# Patient Record
Sex: Female | Born: 1960
Health system: Southern US, Community
[De-identification: ages and names within clinical notes are randomized; demographics above are authoritative.]

## PROBLEM LIST (undated history)

## (undated) DIAGNOSIS — M1990 Unspecified osteoarthritis, unspecified site: Secondary | ICD-10-CM

## (undated) DIAGNOSIS — R011 Cardiac murmur, unspecified: Secondary | ICD-10-CM

## (undated) DIAGNOSIS — I890 Lymphedema, not elsewhere classified: Secondary | ICD-10-CM

## (undated) DIAGNOSIS — F419 Anxiety disorder, unspecified: Secondary | ICD-10-CM

## (undated) DIAGNOSIS — I7300 Raynaud's syndrome without gangrene: Secondary | ICD-10-CM

## (undated) DIAGNOSIS — F329 Major depressive disorder, single episode, unspecified: Secondary | ICD-10-CM

## (undated) DIAGNOSIS — Z87442 Personal history of urinary calculi: Secondary | ICD-10-CM

## (undated) DIAGNOSIS — F32A Depression, unspecified: Secondary | ICD-10-CM

## (undated) DIAGNOSIS — K219 Gastro-esophageal reflux disease without esophagitis: Secondary | ICD-10-CM

## (undated) DIAGNOSIS — J189 Pneumonia, unspecified organism: Secondary | ICD-10-CM

## (undated) DIAGNOSIS — M792 Neuralgia and neuritis, unspecified: Secondary | ICD-10-CM

## (undated) DIAGNOSIS — T8859XA Other complications of anesthesia, initial encounter: Secondary | ICD-10-CM

## (undated) HISTORY — PX: OTHER SURGICAL HISTORY: SHX169

## (undated) HISTORY — DX: Unspecified osteoarthritis, unspecified site: M19.90

## (undated) HISTORY — DX: Neuralgia and neuritis, unspecified: M79.2

## (undated) HISTORY — DX: Raynaud's syndrome without gangrene: I73.00

## (undated) HISTORY — PX: JOINT REPLACEMENT: SHX530

## (undated) HISTORY — DX: Anxiety disorder, unspecified: F41.9

## (undated) HISTORY — PX: REFRACTIVE SURGERY: SHX103

## (undated) HISTORY — PX: TOTAL SHOULDER REPLACEMENT: SUR1217

## (undated) HISTORY — DX: Depression, unspecified: F32.A

## (undated) HISTORY — DX: Gastro-esophageal reflux disease without esophagitis: K21.9

## (undated) HISTORY — DX: Major depressive disorder, single episode, unspecified: F32.9

## (undated) HISTORY — DX: Lymphedema, not elsewhere classified: I89.0

## (undated) HISTORY — PX: BREAST BIOPSY: SHX20

---

## 2001-10-03 HISTORY — PX: ABDOMINAL HYSTERECTOMY: SHX81

## 2002-10-03 HISTORY — PX: OTHER SURGICAL HISTORY: SHX169

## 2007-10-04 HISTORY — PX: REPLACEMENT TOTAL KNEE: SUR1224

## 2008-06-24 ENCOUNTER — Other Ambulatory Visit: Payer: Self-pay

## 2008-06-24 ENCOUNTER — Ambulatory Visit: Payer: Self-pay | Admitting: Unknown Physician Specialty

## 2008-06-24 ENCOUNTER — Ambulatory Visit: Payer: Self-pay | Admitting: Cardiovascular Disease

## 2008-06-30 ENCOUNTER — Inpatient Hospital Stay: Payer: Self-pay | Admitting: Unknown Physician Specialty

## 2008-08-11 ENCOUNTER — Ambulatory Visit: Payer: Self-pay | Admitting: Unknown Physician Specialty

## 2008-10-03 HISTORY — PX: ANKLE FRACTURE SURGERY: SHX122

## 2008-10-03 HISTORY — PX: COLONOSCOPY: SHX174

## 2009-10-02 ENCOUNTER — Inpatient Hospital Stay: Payer: Self-pay | Admitting: Orthopedic Surgery

## 2010-01-29 ENCOUNTER — Ambulatory Visit: Payer: Self-pay | Admitting: Orthopedic Surgery

## 2010-09-23 ENCOUNTER — Ambulatory Visit: Payer: Self-pay | Admitting: Unknown Physician Specialty

## 2010-09-23 LAB — HM COLONOSCOPY

## 2010-09-28 LAB — PATHOLOGY REPORT

## 2010-10-03 LAB — HM PAP SMEAR

## 2011-04-25 IMAGING — CR DG KNEE 1-2V*L*
1 series · 2 of 2 positions shown · non-contrast
Comparison: none

REASON FOR EXAM: postop
COMMENTS:   Bedside (portable):Y

[Series 1: view not recorded · 0.17mm/px · 2 of 2 slices shown]
[im 1/2]
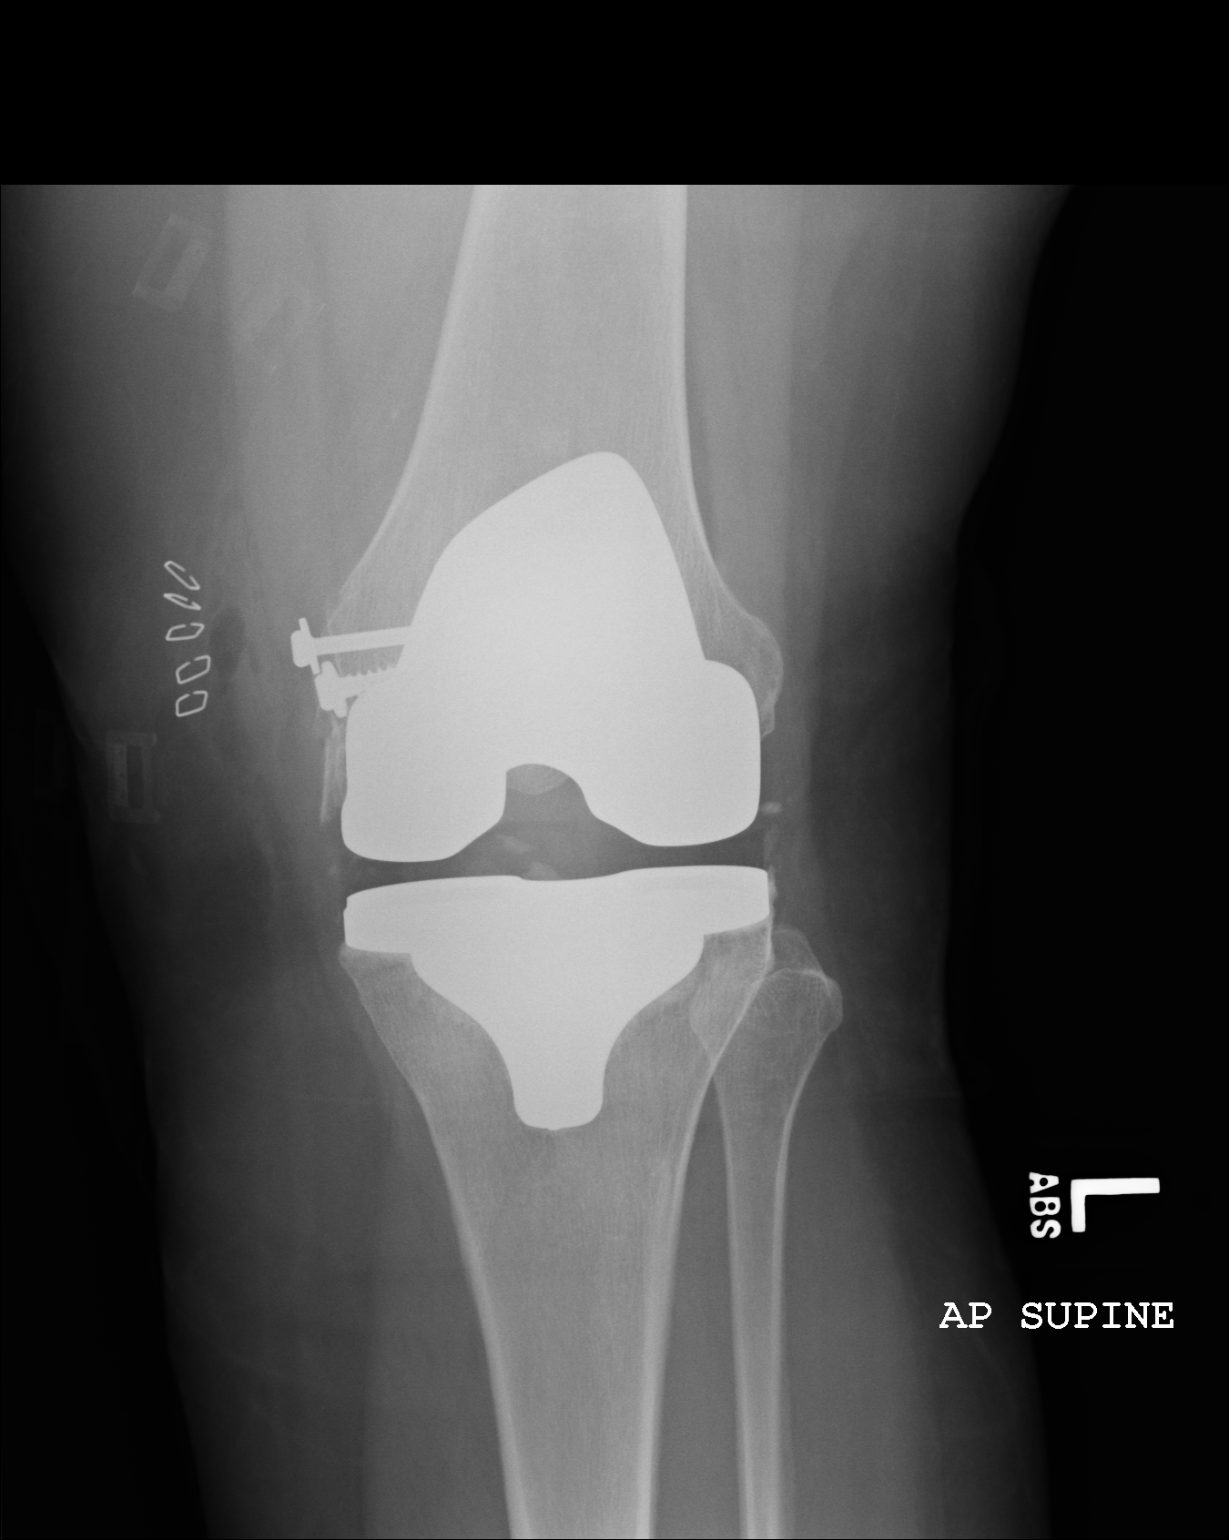
[im 2/2]
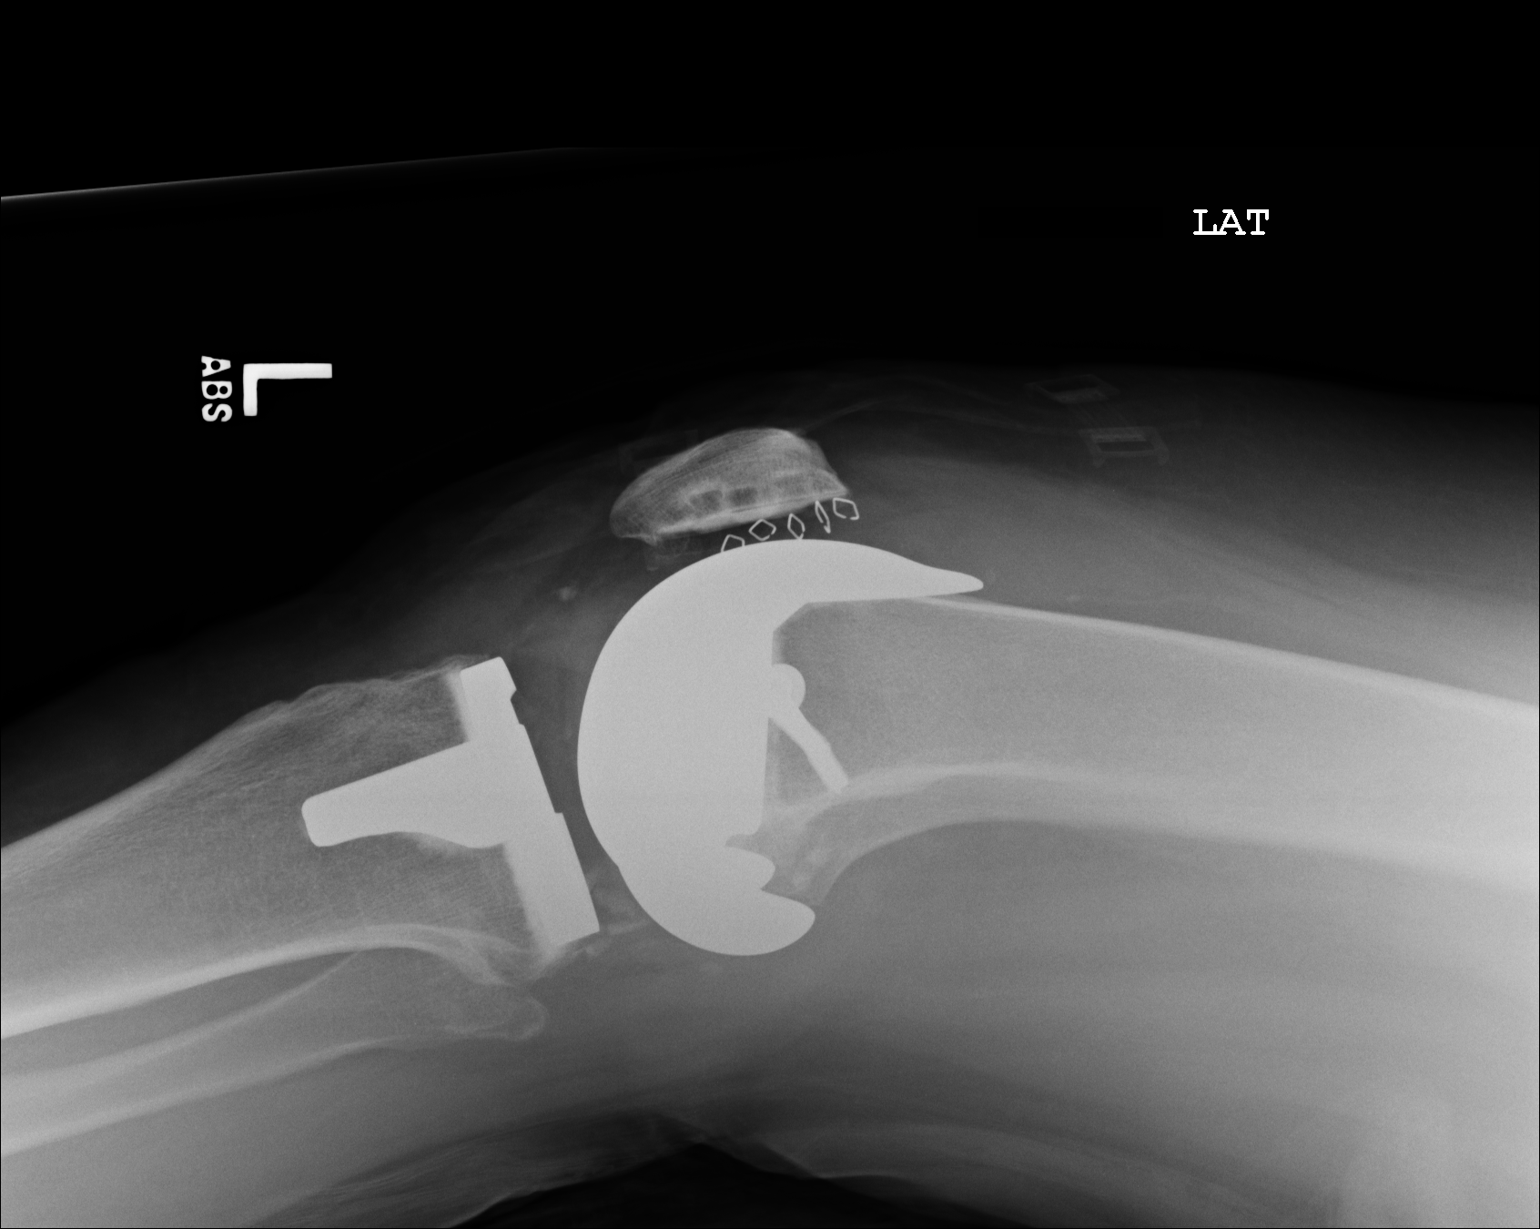

[2 of 2 positions shown; findings below may reference images not displayed]

PROCEDURE:     DXR - DXR KNEE LEFT AP AND LATERAL  - October 03, 2009 [DATE]

RESULT:

The patient is status post cannulated screw fixation of a medial femoral
condylar fracture. The hardware appears intact. Skin staples are identified
about the fracture site. The remaining osseous structures are unremarkable.
IMPRESSION: Open reduction and internal fixation of a medial femoral
condylar fracture.

## 2013-04-15 ENCOUNTER — Ambulatory Visit: Payer: Self-pay | Admitting: Family Medicine

## 2013-04-17 ENCOUNTER — Emergency Department: Payer: Self-pay | Admitting: Internal Medicine

## 2013-04-17 LAB — URINALYSIS, COMPLETE
Glucose,UR: NEGATIVE mg/dL (ref 0–75)
Ketone: NEGATIVE
Nitrite: NEGATIVE
Ph: 6 (ref 4.5–8.0)
RBC,UR: 7 /HPF (ref 0–5)
Squamous Epithelial: 2
WBC UR: 20 /HPF (ref 0–5)

## 2013-04-17 LAB — COMPREHENSIVE METABOLIC PANEL
Alkaline Phosphatase: 148 U/L — ABNORMAL HIGH (ref 50–136)
Bilirubin,Total: 0.6 mg/dL (ref 0.2–1.0)
Calcium, Total: 8.8 mg/dL (ref 8.5–10.1)
Chloride: 105 mmol/L (ref 98–107)
Co2: 28 mmol/L (ref 21–32)
EGFR (Non-African Amer.): 32 — ABNORMAL LOW
Glucose: 141 mg/dL — ABNORMAL HIGH (ref 65–99)
Potassium: 3.4 mmol/L — ABNORMAL LOW (ref 3.5–5.1)
SGOT(AST): 27 U/L (ref 15–37)
SGPT (ALT): 30 U/L (ref 12–78)
Total Protein: 7 g/dL (ref 6.4–8.2)

## 2013-04-17 LAB — CBC
HCT: 37.9 % (ref 35.0–47.0)
MCH: 26.4 pg (ref 26.0–34.0)
MCV: 81 fL (ref 80–100)
Platelet: 208 10*3/uL (ref 150–440)

## 2013-04-18 LAB — URINE CULTURE

## 2013-04-22 LAB — CULTURE, BLOOD (SINGLE)

## 2013-10-28 ENCOUNTER — Ambulatory Visit: Payer: Self-pay | Admitting: Orthopedic Surgery

## 2013-11-05 LAB — HM DEXA SCAN: HM Dexa Scan: NORMAL

## 2014-07-17 ENCOUNTER — Ambulatory Visit: Payer: Self-pay | Admitting: Neurology

## 2014-08-04 ENCOUNTER — Ambulatory Visit: Payer: Self-pay | Admitting: General Surgery

## 2014-08-04 ENCOUNTER — Encounter: Payer: Self-pay | Admitting: General Surgery

## 2014-08-04 ENCOUNTER — Ambulatory Visit (INDEPENDENT_AMBULATORY_CARE_PROVIDER_SITE_OTHER): Payer: 59 | Admitting: General Surgery

## 2014-08-04 VITALS — BP 120/84 | HR 76 | Temp 99.0°F | Resp 14 | Ht 73.0 in | Wt 261.0 lb

## 2014-08-04 DIAGNOSIS — R1030 Lower abdominal pain, unspecified: Secondary | ICD-10-CM

## 2014-08-04 DIAGNOSIS — R197 Diarrhea, unspecified: Secondary | ICD-10-CM

## 2014-08-04 LAB — CBC AND DIFFERENTIAL
HCT: 43 % (ref 36–46)
Hemoglobin: 14.3 g/dL (ref 12.0–16.0)
PLATELETS: 269 10*3/uL (ref 150–399)
WBC: 6.1 10^3/mL

## 2014-08-04 LAB — BASIC METABOLIC PANEL
BUN: 16 mg/dL (ref 4–21)
Creatinine: 1 mg/dL (ref <=1.1)
GLUCOSE: 98 mg/dL
POTASSIUM: 4.3 mmol/L (ref 3.4–5.3)
Sodium: 141 mmol/L (ref 137–147)

## 2014-08-04 LAB — HEPATIC FUNCTION PANEL
ALT: 14 U/L (ref 7–35)
AST: 19 U/L (ref 13–35)

## 2014-08-04 NOTE — Patient Instructions (Addendum)
Patient to have Ct Scan done. The patient is aware to call back for any questions or concerns.

## 2014-08-04 NOTE — Progress Notes (Signed)
Patient ID: Lisa Stein, female   DOB: 1961-06-23, 53 y.o.   MRN: [id number]  Chief Complaint  Patient presents with  . Other    evaluation for appendicitis    HPI Lisa Stein is a 53 y.o. female who presents for an evaluation of appendicitis. The patient states she started having abdominal cramping and diarrhea that started on Friday 08/01/14. The abdominal pain has progressively gotten worse. It started on the right side and has radiated to the left. She describes the pain as a soreness all over. No episodes of vomiting. She states the Thursday before, 07/24/14 she had an episode of abdominal cramping and vomiting that lasted several hours and then subsided. She was able to return to work the next and as well as go out of town that weekend. Current temperature is 99.0. The patient states yesterday she would episodes of diarrhea every time she tried to eat something. She had an episode of a black tarry stool yesterday.  Accompanied by her husband.   HPI  Past Medical History  Diagnosis Date  . Osteoarthritis   . Neuralgia   . GERD (gastroesophageal reflux disease)   . Raynaud disease     Past Surgical History  Procedure Laterality Date  . Replacement total knee  2009  . Total shoulder replacement  [id number]  . Abdominal hysterectomy  2003  . Ankle fracture surgery  2010  . Colonoscopy  2010    Dr. Vira Agar    History reviewed. No pertinent family history.  Social History History  Substance Use Topics  . Smoking status: Former Smoker -- 5 years  . Smokeless tobacco: Never Used  . Alcohol Use: No    Allergies  Allergen Reactions  . Biaxin [Clarithromycin] Diarrhea  . Sulfa Antibiotics Itching    Current Outpatient Prescriptions  Medication Sig Dispense Refill  . celecoxib (CELEBREX) 200 MG capsule Take 200 mg by mouth daily.    . Cholecalciferol (VITAMIN D-3 PO) Take 1 capsule by mouth daily.    . diazepam (VALIUM) 5 MG tablet Take 5 mg by mouth as needed for  anxiety.    . diphenhydrAMINE (BENADRYL) 50 MG tablet Take 50 mg by mouth at bedtime as needed for itching.    . fluticasone (VERAMYST) 27.5 MCG/SPRAY nasal spray Place 2 sprays into the nose daily.    Marland Kitchen HYDROcodone-acetaminophen (NORCO/VICODIN) 5-325 MG per tablet Take 1 tablet by mouth every 6 (six) hours as needed.   0  . lamoTRIgine (LAMICTAL) 25 MG tablet Take 25 mg by mouth 2 (two) times daily.   2  . omeprazole (PRILOSEC) 20 MG capsule Take 20 mg by mouth daily.    . sertraline (ZOLOFT) 100 MG tablet Take 200 mg by mouth daily.    . TEGRETOL-XR 100 MG 12 hr tablet Take 100 mg by mouth 2 (two) times daily.   5  . polyethylene glycol powder (GLYCOLAX/MIRALAX) powder Take 255 g by mouth once. 255 g 0   No current facility-administered medications for this visit.    Review of Systems Review of Systems  Constitutional: Positive for chills.  Respiratory: Negative.   Cardiovascular: Negative.   Gastrointestinal: Positive for abdominal pain and diarrhea.    Blood pressure 120/84, pulse 76, temperature 99 F (37.2 C), temperature source Oral, resp. rate 14, height 6\' 1"  (1.854 m), weight 261 lb (118.389 kg).  Physical Exam Physical Exam  Constitutional: She is oriented to person, place, and time. She appears well-developed and well-nourished.  Cardiovascular: Normal rate,  regular rhythm and normal heart sounds.   No murmur heard. Pulmonary/Chest: Effort normal and breath sounds normal.  Abdominal: Soft. Normal appearance and bowel sounds are normal. There is no hepatosplenomegaly. There is tenderness in the right lower quadrant. No hernia.    Neurological: She is alert and oriented to person, place, and time.  Skin: Skin is warm and dry.    Data Reviewed Discussed w/ PCP. Labs drawn but not available.   Assessment    Atypical abdominal pain, strong component of recurrent diarrhea. Low suspicion for appendicitis.     Plan    Patient has been scheduled for a CT  abdomen/pelvis with contrast at [facility] for this afternoon. This patient verbalizes understanding. Will see patient in CT department after scan completed.      PCP/Ref MD:  Philemon Kingdom 08/05/2014, 9:46 PM

## 2014-08-05 ENCOUNTER — Encounter: Payer: Self-pay | Admitting: General Surgery

## 2014-08-05 ENCOUNTER — Ambulatory Visit (INDEPENDENT_AMBULATORY_CARE_PROVIDER_SITE_OTHER): Payer: 59 | Admitting: General Surgery

## 2014-08-05 ENCOUNTER — Other Ambulatory Visit: Payer: Self-pay | Admitting: General Surgery

## 2014-08-05 VITALS — BP 146/86 | HR 78 | Temp 98.6°F | Resp 14 | Ht 73.0 in | Wt 260.0 lb

## 2014-08-05 DIAGNOSIS — R935 Abnormal findings on diagnostic imaging of other abdominal regions, including retroperitoneum: Secondary | ICD-10-CM

## 2014-08-05 DIAGNOSIS — R1030 Lower abdominal pain, unspecified: Secondary | ICD-10-CM

## 2014-08-05 DIAGNOSIS — R197 Diarrhea, unspecified: Secondary | ICD-10-CM

## 2014-08-05 HISTORY — DX: Diarrhea, unspecified: R19.7

## 2014-08-05 HISTORY — DX: Lower abdominal pain, unspecified: R10.30

## 2014-08-05 MED ORDER — POLYETHYLENE GLYCOL 3350 17 GM/SCOOP PO POWD: 1.0000 | Freq: Once | ORAL | Status: DC

## 2014-08-05 NOTE — Patient Instructions (Addendum)
Patient to be scheduled for colonoscopy. The patient is aware to call back for any questions or concerns.  Colonoscopy A colonoscopy is an exam to look at the entire large intestine (colon). This exam can help find problems such as tumors, polyps, inflammation, and areas of bleeding. The exam takes about 1 hour.  LET [facility] CARE PROVIDER KNOW ABOUT:   Any allergies you have.  All medicines you are taking, including vitamins, herbs, eye drops, creams, and over-the-counter medicines.  Previous problems you or members of your family have had with the use of anesthetics.  Any blood disorders you have.  Previous surgeries you have had.  Medical conditions you have. RISKS AND COMPLICATIONS  Generally, this is a safe procedure. However, as with any procedure, complications can occur. Possible complications include:  Bleeding.  Tearing or rupture of the colon wall.  Reaction to medicines given during the exam.  Infection (rare). BEFORE THE PROCEDURE   Ask your health care provider about changing or stopping your regular medicines.  You may be prescribed an oral bowel prep. This involves drinking a large amount of medicated liquid, starting the day before your procedure. The liquid will cause you to have multiple loose stools until your stool is almost clear or light green. This cleans out your colon in preparation for the procedure.  Do not eat or drink anything else once you have started the bowel prep, unless your health care provider tells you it is safe to do so.  Arrange for someone to drive you home after the procedure. PROCEDURE   You will be given medicine to help you relax (sedative).  You will lie on your side with your knees bent.  A long, flexible tube with a light and camera on the end (colonoscope) will be inserted through the rectum and into the colon. The camera sends video back to a computer screen as it moves through the colon. The colonoscope also releases  carbon dioxide gas to inflate the colon. This helps your health care provider see the area better.  During the exam, your health care provider may take a small tissue sample (biopsy) to be examined under a microscope if any abnormalities are found.  The exam is finished when the entire colon has been viewed. AFTER THE PROCEDURE   Do not drive for 24 hours after the exam.  You may have a small amount of blood in your stool.  You may pass moderate amounts of gas and have mild abdominal cramping or bloating. This is caused by the gas used to inflate your colon during the exam.  Ask when your test results will be ready and how you will get your results. Make sure you get your test results. Document Released: 09/16/2000 Document Revised: 07/10/2013 Document Reviewed: 05/27/2013 [facility] Patient Information 2015 [place], [place]. This information is not intended to replace advice given to you by your health care provider. Make sure you discuss any questions you have with your health care provider.  The patient is scheduled for a colonoscopy at [facility] on 08/07/14. She will pre register with the hospital today. She will only take her Lamictal and Tegretol at 6 am with a small sip of water the morning of. Miralax prescription has been sent into her pharmacy. She will continue her clear liquid diet after her colonoscopy. She is scheduled for surgery at [facility] on 08/08/14. She will pre admit by phone on 08/08/14. Patient is aware of dates and all instructions.

## 2014-08-05 NOTE — Progress Notes (Signed)
Patient ID: Lisa Stein, female   DOB: 01-19-1961, 53 y.o.   MRN: [id number]  Chief Complaint  Patient presents with  . Follow-up    CT scan    HPI Lisa Stein is a 53 y.o. female.  Here today for follow up CT scan and labs drawn yesterday. She states she had night sweats last night and feels nauseated, no vomiting. Has tolerated a soft diet without nausea. Loose stools persist.  HPI  Past Medical History  Diagnosis Date  . Osteoarthritis   . Neuralgia   . GERD (gastroesophageal reflux disease)   . Raynaud disease     Past Surgical History  Procedure Laterality Date  . Replacement total knee  2009  . Total shoulder replacement  [id number]  . Abdominal hysterectomy  2003  . Ankle fracture surgery  2010  . Colonoscopy  2010    Dr. Vira Agar    No family history on file.  Social History History  Substance Use Topics  . Smoking status: Former Smoker -- 5 years  . Smokeless tobacco: Never Used  . Alcohol Use: No    Allergies  Allergen Reactions  . Biaxin [Clarithromycin] Diarrhea  . Sulfa Antibiotics Itching    Current Outpatient Prescriptions  Medication Sig Dispense Refill  . celecoxib (CELEBREX) 200 MG capsule Take 200 mg by mouth daily.    . Cholecalciferol (VITAMIN D-3 PO) Take 1 capsule by mouth daily.    . diazepam (VALIUM) 5 MG tablet Take 5 mg by mouth as needed for anxiety.    . diphenhydrAMINE (BENADRYL) 50 MG tablet Take 50 mg by mouth at bedtime as needed for itching.    . fluticasone (VERAMYST) 27.5 MCG/SPRAY nasal spray Place 2 sprays into the nose daily.    Marland Kitchen HYDROcodone-acetaminophen (NORCO/VICODIN) 5-325 MG per tablet Take 1 tablet by mouth every 6 (six) hours as needed.   0  . lamoTRIgine (LAMICTAL) 25 MG tablet Take 25 mg by mouth 2 (two) times daily.   2  . omeprazole (PRILOSEC) 20 MG capsule Take 20 mg by mouth daily.    . sertraline (ZOLOFT) 100 MG tablet Take 200 mg by mouth daily.    . TEGRETOL-XR 100 MG 12 hr tablet Take 100 mg by  mouth 2 (two) times daily.   5  . polyethylene glycol powder (GLYCOLAX/MIRALAX) powder Take 255 g by mouth once. 255 g 0   No current facility-administered medications for this visit.    Review of Systems Review of Systems  Respiratory: Negative.   Cardiovascular: Negative.   Gastrointestinal: Positive for nausea. Negative for vomiting.  Neurological: Positive for weakness.    Blood pressure 146/86, pulse 78, temperature 98.6 F (37 C), temperature source Oral, resp. rate 14, height 6' 1"  (1.854 m), weight 260 lb (117.935 kg).  Physical Exam Physical Exam  Constitutional: She is oriented to person, place, and time. She appears well-developed and well-nourished.  Cardiovascular: Normal rate, regular rhythm and normal heart sounds.   No murmur heard. Pulmonary/Chest: Effort normal and breath sounds normal.  Abdominal: Soft. Normal appearance and bowel sounds are normal. There is no tenderness.  Neurological: She is alert and oriented to person, place, and time.  Skin: Skin is warm and dry.    Data Reviewed CT of yesterday reported to show a 6 cm mass in the cecum.  On my review, possible thickening of terminal ileum with short segment dilatation. Labs completed yesterday, CBC and Met C normal.  Assessment    Abdominal pain, abnormal  imaging.      Plan    Repeat colonoscopy is indicated to confirm CT findings of a cecal mass. Last exam was five years ago.  Would be unlikely to develop a large mass in this short of an interval.  Prior procedure completed by Dr. Tiffany Kocher.. Offered to contact him to coordinate study, patient comfortable with my completing procedure. This would be followed by surgery the following day, right colectomy if cecal tumor, cecectomy and resection of the TI if evidence of Crohns.      The patient is scheduled for a colonoscopy at [facility] on 08/07/14. She will pre register with the hospital today. She will only take her Lamictal and Tegretol at 6 am with a small  sip of water the morning of. Miralax prescription has been sent into her pharmacy. She will continue her clear liquid diet after her colonoscopy. She is scheduled for surgery at [facility] on 08/08/14. She will pre admit by phone on 08/08/14. Patient is aware of dates and all instructions.   PCP:  Philemon Kingdom 08/05/2014, 9:54 PM

## 2014-08-07 ENCOUNTER — Ambulatory Visit: Payer: Self-pay | Admitting: General Surgery

## 2014-08-07 DIAGNOSIS — D374 Neoplasm of uncertain behavior of colon: Secondary | ICD-10-CM

## 2014-08-07 HISTORY — PX: COLONOSCOPY: SHX174

## 2014-08-08 ENCOUNTER — Inpatient Hospital Stay: Payer: Self-pay | Admitting: General Surgery

## 2014-08-08 DIAGNOSIS — D122 Benign neoplasm of ascending colon: Secondary | ICD-10-CM

## 2014-08-08 HISTORY — PX: COLON SURGERY: SHX602

## 2014-08-08 HISTORY — PX: HEMICOLECTOMY: SHX854

## 2014-08-11 ENCOUNTER — Encounter: Payer: Self-pay | Admitting: General Surgery

## 2014-08-12 ENCOUNTER — Encounter: Payer: Self-pay | Admitting: General Surgery

## 2014-08-14 ENCOUNTER — Encounter: Payer: Self-pay | Admitting: General Surgery

## 2014-08-18 ENCOUNTER — Ambulatory Visit (INDEPENDENT_AMBULATORY_CARE_PROVIDER_SITE_OTHER): Payer: Self-pay | Admitting: General Surgery

## 2014-08-18 ENCOUNTER — Encounter: Payer: Self-pay | Admitting: General Surgery

## 2014-08-18 VITALS — BP 130/76 | HR 76 | Resp 12 | Ht 73.0 in | Wt 261.0 lb

## 2014-08-18 DIAGNOSIS — R1030 Lower abdominal pain, unspecified: Secondary | ICD-10-CM

## 2014-08-18 DIAGNOSIS — K561 Intussusception: Secondary | ICD-10-CM

## 2014-08-18 NOTE — Progress Notes (Signed)
Patient ID: Lisa Stein, female   DOB: 07/24/1961, 53 y.o.   MRN: [id number]  Chief Complaint  Patient presents with  . Routine Post Op    hemicolectomy    HPI Lisa Stein is a 53 y.o. female here today for her post op hemicolectomy done on 08/08/14. Patient states she is doing well.  HPI  Past Medical History  Diagnosis Date  . Osteoarthritis   . Neuralgia   . GERD (gastroesophageal reflux disease)   . Raynaud disease     Past Surgical History  Procedure Laterality Date  . Replacement total knee  2009  . Total shoulder replacement  [id number]  . Abdominal hysterectomy  2003  . Ankle fracture surgery  2010  . Colonoscopy  2010    Dr. Vira Agar  . Hemicolectomy  08/08/14  . 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    No family history on file.  Social History History  Substance Use Topics  . Smoking status: Former Smoker -- 5 years  . Smokeless tobacco: Never Used  . Alcohol Use: No    Allergies  Allergen Reactions  . Biaxin [Clarithromycin] Diarrhea  . Sulfa Antibiotics Itching    Current Outpatient Prescriptions  Medication Sig Dispense Refill  . celecoxib (CELEBREX) 200 MG capsule Take 200 mg by mouth daily.    . Cholecalciferol (VITAMIN D-3 PO) Take 1 capsule by mouth daily.    . diazepam (VALIUM) 5 MG tablet Take 5 mg by mouth as needed for anxiety.    . diphenhydrAMINE (BENADRYL) 50 MG tablet Take 50 mg by mouth at bedtime as needed for itching.    . fluticasone (VERAMYST) 27.5 MCG/SPRAY nasal spray Place 2 sprays into the nose daily.    Marland Kitchen HYDROcodone-acetaminophen (NORCO/VICODIN) 5-325 MG per tablet Take 1 tablet by mouth every 6 (six) hours as needed.   0  . lamoTRIgine (LAMICTAL) 25 MG tablet Take 25 mg by mouth 2 (two) times daily.   2  . omeprazole (PRILOSEC) 20 MG capsule Take 20 mg by mouth daily.    . polyethylene glycol powder (GLYCOLAX/MIRALAX)  powder Take 255 g by mouth once. 255 g 0  . sertraline (ZOLOFT) 100 MG tablet Take 200 mg by mouth daily.    . TEGRETOL-XR 100 MG 12 hr tablet Take 100 mg by mouth 2 (two) times daily.   5   No current facility-administered medications for this visit.    Review of Systems Review of Systems  Constitutional: Negative.   Respiratory: Negative.   Cardiovascular: Negative.     Blood pressure 130/76, pulse 76, resp. rate 12, height 6\' 1"  (1.854 m), weight 261 lb (118.389 kg).  Physical Exam Physical Exam  Constitutional: She is oriented to person, place, and time. She appears well-developed and well-nourished.  Eyes: Conjunctivae are normal. No scleral icterus.  Neck: Neck supple.  Cardiovascular: Normal rate, regular rhythm and normal heart sounds.   Pulmonary/Chest: Effort normal and breath sounds normal.  Abdominal: Soft. Normal appearance and bowel sounds are normal.  Lymphadenopathy:    She has no cervical adenopathy.  Neurological: She is alert and oriented to person, place, and time.  Skin: Skin is warm and dry.    Data Reviewed CT scan had suggested a 6 cm mass in the cecum. Colonoscopy was negative except for a mass in the cecum. Biopsy was not felt to be of benefit and she subsequently underwent  a right hemicolectomy with findings of mucosal necrosis at the ileocecal valve but otherwise no pathologic diagnosis.  Assessment    Doing well status post right colectomy for abdominal pain (resolved) and abnormal imaging.     Plan    Patient t return in one month. Care with lifting was reviewed.         Robert Bellow 08/19/2014, 8:50 AM

## 2014-08-18 NOTE — Patient Instructions (Signed)
Patient to return in one month. 

## 2014-08-19 ENCOUNTER — Encounter: Payer: Self-pay | Admitting: General Surgery

## 2014-08-19 DIAGNOSIS — K561 Intussusception: Secondary | ICD-10-CM | POA: Insufficient documentation

## 2014-09-16 ENCOUNTER — Ambulatory Visit: Payer: 59 | Admitting: General Surgery

## 2014-09-24 ENCOUNTER — Encounter: Payer: Self-pay | Admitting: General Surgery

## 2014-09-24 ENCOUNTER — Ambulatory Visit (INDEPENDENT_AMBULATORY_CARE_PROVIDER_SITE_OTHER): Payer: Self-pay | Admitting: General Surgery

## 2014-09-24 VITALS — BP 122/82 | HR 70 | Resp 14 | Ht 72.0 in | Wt 260.0 lb

## 2014-09-24 DIAGNOSIS — K561 Intussusception: Secondary | ICD-10-CM

## 2014-09-24 NOTE — Patient Instructions (Signed)
Patient to return in 10 year colonoscopy recall

## 2014-09-24 NOTE — Progress Notes (Signed)
Patient ID: Lisa Stein, female   DOB: [birth year], 53 y.o.   MRN: [id number]  Chief Complaint  Patient presents with  . Follow-up    intussuception     HPI Lisa Stein is a 53 y.o. female here today for her one month Intussusception,ileocecal. Patient states she is doing well. Bowel movements are now occurring about every other day, a marked improvement from her baseline. HPI  Past Medical History  Diagnosis Date  . Osteoarthritis   . Neuralgia   . GERD (gastroesophageal reflux disease)   . Raynaud disease     Past Surgical History  Procedure Laterality Date  . Replacement total knee  2009  . Total shoulder replacement  [id number]  . Abdominal hysterectomy  2003  . Ankle fracture surgery  2010  . Colonoscopy  2010    Dr. Vira Agar  . Hemicolectomy  08/08/14  . 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    No family history on file.  Social History History  Substance Use Topics  . Smoking status: Former Smoker -- 5 years  . Smokeless tobacco: Never Used  . Alcohol Use: No    Allergies  Allergen Reactions  . Biaxin [Clarithromycin] Diarrhea  . Sulfa Antibiotics Itching    Current Outpatient Prescriptions  Medication Sig Dispense Refill  . amoxicillin (AMOXIL) 875 MG tablet     . celecoxib (CELEBREX) 200 MG capsule Take 200 mg by mouth daily.    . Cholecalciferol (VITAMIN D-3 PO) Take 1 capsule by mouth daily.    . diazepam (VALIUM) 5 MG tablet Take 5 mg by mouth as needed for anxiety.    . diphenhydrAMINE (BENADRYL) 50 MG tablet Take 50 mg by mouth at bedtime as needed for itching.    . fluticasone (VERAMYST) 27.5 MCG/SPRAY nasal spray Place 2 sprays into the nose daily.    Marland Kitchen HYDROcodone-acetaminophen (NORCO/VICODIN) 5-325 MG per tablet Take 1 tablet by mouth every 6 (six) hours as needed.   0  . lamoTRIgine (LAMICTAL) 25 MG tablet Take 25 mg by mouth 2 (two)  times daily.   2  . omeprazole (PRILOSEC) 20 MG capsule Take 20 mg by mouth daily.    . polyethylene glycol powder (GLYCOLAX/MIRALAX) powder Take 255 g by mouth once. 255 g 0  . sertraline (ZOLOFT) 100 MG tablet Take 200 mg by mouth daily.    . TEGRETOL-XR 100 MG 12 hr tablet Take 100 mg by mouth 2 (two) times daily.   5   No current facility-administered medications for this visit.    Review of Systems Review of Systems  Constitutional: Negative.   Respiratory: Negative.   Cardiovascular: Negative.     Blood pressure 122/82, pulse 70, resp. rate 14, height 6' (1.829 m), weight 260 lb (117.935 kg).  Physical Exam Physical Exam  Constitutional: She is oriented to person, place, and time. She appears well-developed and well-nourished.  Eyes: Conjunctivae are normal. No scleral icterus.  Neck: Neck supple.  Cardiovascular: Normal rate, regular rhythm and normal heart sounds.   Pulmonary/Chest: Effort normal and breath sounds normal.  Abdominal: Soft. Normal appearance and bowel sounds are normal. There is no tenderness.    Lymphadenopathy:    She has no cervical adenopathy.  Neurological: She is alert and oriented to person, place, and time.  Skin: Skin is warm and dry.      Assessment    Doing well status post  right hemicolectomy for intussusception of the ileocecal valve.    Plan    Patient to return as needed.10 year recall colonoscopy.     PCP: Philemon Kingdom 09/26/2014, 3:07 PM

## 2014-10-03 HISTORY — PX: TOOTH EXTRACTION: SUR596

## 2014-11-20 DIAGNOSIS — M5136 Other intervertebral disc degeneration, lumbar region: Secondary | ICD-10-CM | POA: Insufficient documentation

## 2014-11-20 DIAGNOSIS — M5116 Intervertebral disc disorders with radiculopathy, lumbar region: Secondary | ICD-10-CM | POA: Insufficient documentation

## 2015-01-24 NOTE — Op Note (Signed)
PATIENT NAME:  Lisa Stein, ROMBERG MR#:  [id number] DATE OF BIRTH:  1960-12-08  DATE OF PROCEDURE:  08/08/2014  PREOPERATIVE DIAGNOSIS: Cecal mass.   POSTOPERATIVE DIAGNOSIS: Cecal mass.  OPERATIVE PROCEDURE: Laparoscopically assisted right hemicolectomy with ileocolostomy.   SURGEON: Robert Bellow, MD   ASSISTANT: Mckinley Jewel, MD  ANESTHESIA: General endotracheal.  ESTIMATED BLOOD LOSS: 150 mL   CLINICAL NOTE: This 54 year old woman had had episodic abdominal pain and 2 episodes of diarrhea. CT scan showed evidence of a filling defect in the cecum. Colonoscopy completed yesterday was unremarkable, except for an ill-defined  filling defect in the cecum. She is brought to the Operating Room today for a planned right colectomy. She received Invanz by vein and Entereg p.o. prior to the procedure. TED stockings and pneumatic compression stockings were used for DVT prophylaxis. A Foley catheter was placed by the physician after the induction of general anesthesia.   OPERATIVE NOTE: With the patient under adequate general endotracheal anesthesia and supported on a beanbag with the left arm tucked, the abdomen was prepped with ChloraPrep and draped. In Trendelenburg position, a Veress needle was placed through a transumbilical incision. After assuring intra-abdominal location with the hanging drop test, the abdomen was insufflated with CO2 at 10 mmHg pressure. A 10 mm step port was expanded, and inspection showed no evidence of injury from initial port placement. The Niger ink placed at the time of yesterday's colonoscopy was identified in the cecum. No intra-abdominal adhesions. No visible lesion on the surface of the liver.   An 11 mm XL port was placed in the left upper quadrant, just to the left of the falciform ligament, and a similar port placed in the hypogastrium. The right colon was mobilized by division of the white line of Toldt using the Harmonic scalpel. The omentum was removed from the  right half of the transverse mesocolon in a similar fashion, and the hepatic flexure rolled medially. The duodenum was visualized and protected. The appendix and terminal ileum were mobilized and, when all structures would reached the midline at the umbilicus, the abdomen was desufflated and an 8 cm incision transecting the umbilicus was carried down through the skin and subcutaneous tissue. Hemostasis was with electrocautery.   The midline fascia was opened. The right colon was mobilized into the wound after placing an [place] wound protector. A side-to-side, functional end-to-end, anastomosis was completed after dividing the mesentery down to its base with the Harmonic scalpel. The right colic artery was controlled with a 2-0 Vicryl tie x 2. The anastomosis was completed making use of a GIA stapler. The terminal ileum was divided approximately 8 cm proximal to the ileocecal valve. The ileum and transverse colon were brought together with interrupted 3-0 silk sutures. The initial firing of the GIA stapler showed good hemostasis. A second firing perpendicular to this divided both lumens and provided a nice watertight seal. The crotch and ends of the anastomosis were reinforced with 3-0 silk seromuscular sutures. The mesentery was closed with a running 3-0 Vicryl suture.   The abdomen was irrigated with warm saline solution. Good hemostasis was noted. The omentum was brought down over the surgical field. Surgeon's gowns and gloves were changed. New instruments were brought onto the field, and the peritoneum closed with a running 2-0 Vicryl. The fascia was closed with a running 0 Maxon suture in 2 segments. Adipose layer was approximated with 2-0 Vicryl, and the skin closed with staples. The port sites in the hypogastrium and left upper  quadrant were closed with staples. A honeycomb dressing was applied, and the patient was taken to the recovery room after removal of the Foley catheter.   The patient tolerated  the procedure well.     ____________________________ Robert Bellow, MD jwb:MT D: 08/08/2014 20:32:20 ET T: 08/09/2014 08:19:06 ET JOB#: [id number]  cc: Robert Bellow, MD, <Dictator> Richard L. Rosanna Randy, MD Abyan Cadman Amedeo Kinsman MD ELECTRONICALLY SIGNED 08/11/2014 20:13

## 2015-01-24 NOTE — Discharge Summary (Signed)
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   CLINICAL NOTE: This 54 year old woman had presented with a history of abdominal pain. CT scan suggested a 6 cm mass in the cecum. Laboratory studies were unremarkable. Colonoscopy completed the day prior to admission showed a mass protruding from the ileocecal area and she was felt to be a candidate for surgical resection.   The patient was taken to the Operating Room on 11/06 at which time she underwent a laparoscopically-assisted right hemicolectomy with ileocolic anastomosis. Her postoperative course was unremarkable. She tolerated clear liquids the day of surgery and was advanced to a soft diet over the next 72 hours.  The patient had flatus but no bowel movement prior to discharge.  Her cardiopulmonary exam was unremarkable and she was felt to be a candidate for discharge home.   Pathology showed evidence of mucosal necrosis of the ileocecal valve and changes suggestive but not conclusive of intussusception. No evidence of malignancy.   The patient was instructed to resume her preoperative medications after discharge and to make use of Tylenol if needed for pain.  Arrangements were to be made for follow-up in my office in 7 to 10 days.      ____________________________ Robert Bellow, MD jwb:DT D: 08/25/2014 09:22:37 ET T: 08/25/2014 10:01:06 ET JOB#: [id number]  cc: Robert Bellow, MD, <Dictator> Richard L. Rosanna Randy, MD Demarian Epps Amedeo Kinsman MD ELECTRONICALLY SIGNED 08/25/2014 16:40

## 2015-01-26 LAB — SURGICAL PATHOLOGY

## 2015-02-02 LAB — HM MAMMOGRAPHY: HM Mammogram: NEGATIVE

## 2015-02-26 ENCOUNTER — Other Ambulatory Visit: Payer: 59

## 2015-02-26 ENCOUNTER — Encounter: Payer: Self-pay | Admitting: General Surgery

## 2015-02-26 ENCOUNTER — Ambulatory Visit (INDEPENDENT_AMBULATORY_CARE_PROVIDER_SITE_OTHER): Payer: 59 | Admitting: General Surgery

## 2015-02-26 VITALS — BP 118/80 | HR 88 | Resp 16 | Ht 73.0 in | Wt 265.0 lb

## 2015-02-26 DIAGNOSIS — N6310 Unspecified lump in the right breast, unspecified quadrant: Secondary | ICD-10-CM

## 2015-02-26 DIAGNOSIS — L309 Dermatitis, unspecified: Secondary | ICD-10-CM | POA: Diagnosis not present

## 2015-02-26 DIAGNOSIS — N63 Unspecified lump in breast: Secondary | ICD-10-CM

## 2015-02-26 NOTE — Patient Instructions (Signed)
The patient is aware to call back for any questions or concerns. Hydrocortisone as needed

## 2015-02-26 NOTE — Progress Notes (Signed)
Patient ID: Lisa Stein, female   DOB: [birth year], 54 y.o.   MRN: [id number]  Chief Complaint  Patient presents with  . Breast Problem    HPI Lisa Stein is a 54 y.o. female.  who presents for a breast evaluation. The most recent mammogram was done on 02-02-15.  Patient does perform regular self breast checks and gets regular mammograms done.  She states she has a red patch on her right breast that has gotten larger. She states it has been there for 3 weeks.  Denies any knots, itching or heat. She is currently on Augmentin for recent tooth extraction.    HPI  Past Medical History  Diagnosis Date  . Osteoarthritis   . Neuralgia   . GERD (gastroesophageal reflux disease)   . Raynaud disease     Past Surgical History  Procedure Laterality Date  . Replacement total knee  2009  . Total shoulder replacement  [id number]  . Abdominal hysterectomy  2003  . Ankle fracture surgery  2010  . Colonoscopy  2010    Dr. Vira Agar  . Hemicolectomy  08/08/14  . 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  . Colonoscopy  08-07-14    Dr Bary Castilla  . Tooth extraction Right 2016    History reviewed. No pertinent family history.  Social History History  Substance Use Topics  . Smoking status: Former Smoker -- 5 years  . Smokeless tobacco: Never Used  . Alcohol Use: No    Allergies  Allergen Reactions  . Biaxin [Clarithromycin] Diarrhea  . Sulfa Antibiotics Itching    Current Outpatient Prescriptions  Medication Sig Dispense Refill  . amoxicillin (AMOXIL) 875 MG tablet Take 875 mg by mouth 2 (two) times daily.     . celecoxib (CELEBREX) 200 MG capsule Take 200 mg by mouth daily.    . Cholecalciferol (VITAMIN D-3 PO) Take 1 capsule by mouth daily.    . diazepam (VALIUM) 5 MG tablet Take 5 mg by mouth as needed for anxiety.    . diphenhydrAMINE (BENADRYL) 50 MG tablet Take 50 mg by mouth at  bedtime.     . fluticasone (VERAMYST) 27.5 MCG/SPRAY nasal spray Place 2 sprays into the nose as needed.     Marland Kitchen HYDROcodone-acetaminophen (NORCO/VICODIN) 5-325 MG per tablet Take 1 tablet by mouth every 6 (six) hours as needed.   0  . lamoTRIgine (LAMICTAL) 25 MG tablet Take 25 mg by mouth 2 (two) times daily.   2  . omeprazole (PRILOSEC) 20 MG capsule Take 20 mg by mouth daily.    . sertraline (ZOLOFT) 100 MG tablet Take 200 mg by mouth daily.     No current facility-administered medications for this visit.    Review of Systems Review of Systems  Constitutional: Negative.   Respiratory: Negative.   Cardiovascular: Negative.     Blood pressure 118/80, pulse 88, resp. rate 16, height 6\' 1"  (1.854 m), weight 265 lb (120.203 kg).  Physical Exam Physical Exam  Constitutional: She is oriented to person, place, and time. She appears well-developed and well-nourished.  Eyes: Conjunctivae are normal. No scleral icterus.  Neck: Neck supple.  Cardiovascular: Normal rate, regular rhythm and normal heart sounds.   Pulmonary/Chest: Effort normal and breath sounds normal. Right breast exhibits skin change. Right breast exhibits no inverted nipple, no mass, no nipple discharge and no tenderness.  Patch of erythremia medial to right areola 4  cm   Lymphadenopathy:    She has no cervical adenopathy.  Neurological: She is alert and oriented to person, place, and time.  Skin: Skin is warm and dry.    Data Reviewed Mammogram reviewed and stable. Targeted US of the area of skin redness shows no abnormality Assessment   Likely a cutaneous process.     Ultrasound shows no findings.  Plan     Trial of Hydrocortisone cream. Call for any pain, increasing redness and/or swelling Follow up in 2 weeks.     PCP:  Milagros Reap 02/27/2015, 1:55 PM

## 2015-02-27 ENCOUNTER — Encounter: Payer: Self-pay | Admitting: General Surgery

## 2015-03-10 ENCOUNTER — Other Ambulatory Visit: Payer: 59

## 2015-03-10 ENCOUNTER — Encounter: Payer: Self-pay | Admitting: General Surgery

## 2015-03-10 ENCOUNTER — Other Ambulatory Visit: Payer: Self-pay | Admitting: General Surgery

## 2015-03-10 ENCOUNTER — Ambulatory Visit (INDEPENDENT_AMBULATORY_CARE_PROVIDER_SITE_OTHER): Payer: 59 | Admitting: General Surgery

## 2015-03-10 VITALS — BP 138/88 | HR 72 | Resp 12 | Ht 73.0 in | Wt 268.0 lb

## 2015-03-10 DIAGNOSIS — N6310 Unspecified lump in the right breast, unspecified quadrant: Secondary | ICD-10-CM

## 2015-03-10 DIAGNOSIS — N63 Unspecified lump in breast: Secondary | ICD-10-CM

## 2015-03-10 NOTE — Patient Instructions (Signed)
Continue self breast exams. Call office for any new breast issues or concerns. 

## 2015-03-10 NOTE — Progress Notes (Signed)
Patient ID: Lisa Stein, female   DOB: 10/22/1960, 54 y.o.   MRN: [id number]  Chief Complaint  Patient presents with  . Follow-up    HPI Lisa Stein is a 54 y.o. female.  Here today for follow up right breast red patch. She states it is still there, no changes. She did complete her antibiotics.   HPI  Past Medical History  Diagnosis Date  . Osteoarthritis   . Neuralgia   . GERD (gastroesophageal reflux disease)   . Raynaud disease     Past Surgical History  Procedure Laterality Date  . Replacement total knee  2009  . Total shoulder replacement  [id number]  . Abdominal hysterectomy  2003  . Ankle fracture surgery  2010  . Colonoscopy  2010    Dr. Vira Agar  . Hemicolectomy  08/08/14  . 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  . Colonoscopy  08-07-14    Dr Bary Castilla  . Tooth extraction Right 2016    History reviewed. No pertinent family history.  Social History History  Substance Use Topics  . Smoking status: Former Smoker -- 5 years  . Smokeless tobacco: Never Used  . Alcohol Use: No    Allergies  Allergen Reactions  . Biaxin [Clarithromycin] Diarrhea  . Sulfa Antibiotics Itching    Current Outpatient Prescriptions  Medication Sig Dispense Refill  . celecoxib (CELEBREX) 200 MG capsule Take 200 mg by mouth daily.    . Cholecalciferol (VITAMIN D-3 PO) Take 1 capsule by mouth daily.    . diazepam (VALIUM) 5 MG tablet Take 5 mg by mouth as needed for anxiety.    . diphenhydrAMINE (BENADRYL) 50 MG tablet Take 50 mg by mouth at bedtime.     . fluticasone (VERAMYST) 27.5 MCG/SPRAY nasal spray Place 2 sprays into the nose as needed.     . lamoTRIgine (LAMICTAL) 25 MG tablet Take 25 mg by mouth 2 (two) times daily.   2  . omeprazole (PRILOSEC) 20 MG capsule Take 20 mg by mouth daily.    . sertraline (ZOLOFT) 100 MG tablet Take 200 mg by mouth daily.     No current  facility-administered medications for this visit.    Review of Systems Review of Systems  Constitutional: Negative.   Respiratory: Negative.   Cardiovascular: Negative.     Blood pressure 138/88, pulse 72, resp. rate 12, height 6\' 1"  (1.854 m), weight 268 lb (121.564 kg).  Physical Exam Physical Exam  Constitutional: She is oriented to person, place, and time. She appears well-developed and well-nourished.  Neck: Neck supple.  Pulmonary/Chest: Right breast exhibits skin change. Right breast exhibits no inverted nipple, no mass, no nipple discharge and no tenderness. Left breast exhibits no inverted nipple, no mass, no nipple discharge, no skin change and no tenderness.  Mild erythremia right breast at 3-4 o'clock extending 6 cm from areola margin. 2 x 1 cm area of ill defined firmness.  Lymphadenopathy:    She has no cervical adenopathy.    She has no axillary adenopathy.  Neurological: She is alert and oriented to person, place, and time.  Skin: Skin is warm and dry.    Data Reviewed Office notes. [place] of right breast repeated. There is a flat hypo to anechoic area about 5 mm long. It has no suspicious features. No other findings.  Assessment    Persistent skin erythema right breast- normal imaging.  Plan    Punch biopsy recommended and completed today.       With alcohol prep 3-4 ml 1% xylocaine mixed with 0.5% marcaine was instilled in right breast over the area of erythema. Punch biopsy obtained. Bleeding was minimal. Dressed with neosporin and bandaid. PCP:  Milagros Reap 03/11/2015, 10:43 AM

## 2015-03-11 ENCOUNTER — Encounter: Payer: Self-pay | Admitting: General Surgery

## 2015-03-12 ENCOUNTER — Ambulatory Visit: Payer: 59 | Admitting: General Surgery

## 2015-03-13 ENCOUNTER — Encounter: Payer: Self-pay | Admitting: General Surgery

## 2015-03-16 ENCOUNTER — Encounter: Payer: Self-pay | Admitting: Family Medicine

## 2015-03-16 ENCOUNTER — Telehealth: Payer: Self-pay | Admitting: General Surgery

## 2015-03-16 ENCOUNTER — Ambulatory Visit (INDEPENDENT_AMBULATORY_CARE_PROVIDER_SITE_OTHER): Payer: 59 | Admitting: Family Medicine

## 2015-03-16 VITALS — BP 118/70 | HR 80 | Temp 97.9°F | Resp 16 | Wt 269.0 lb

## 2015-03-16 DIAGNOSIS — G500 Trigeminal neuralgia: Secondary | ICD-10-CM | POA: Insufficient documentation

## 2015-03-16 DIAGNOSIS — R300 Dysuria: Secondary | ICD-10-CM

## 2015-03-16 DIAGNOSIS — Z9889 Other specified postprocedural states: Secondary | ICD-10-CM | POA: Insufficient documentation

## 2015-03-16 DIAGNOSIS — N200 Calculus of kidney: Secondary | ICD-10-CM | POA: Insufficient documentation

## 2015-03-16 DIAGNOSIS — J019 Acute sinusitis, unspecified: Secondary | ICD-10-CM | POA: Diagnosis not present

## 2015-03-16 DIAGNOSIS — F329 Major depressive disorder, single episode, unspecified: Secondary | ICD-10-CM | POA: Insufficient documentation

## 2015-03-16 DIAGNOSIS — I7300 Raynaud's syndrome without gangrene: Secondary | ICD-10-CM | POA: Insufficient documentation

## 2015-03-16 DIAGNOSIS — R5383 Other fatigue: Secondary | ICD-10-CM

## 2015-03-16 DIAGNOSIS — D126 Benign neoplasm of colon, unspecified: Secondary | ICD-10-CM | POA: Insufficient documentation

## 2015-03-16 DIAGNOSIS — K219 Gastro-esophageal reflux disease without esophagitis: Secondary | ICD-10-CM | POA: Insufficient documentation

## 2015-03-16 DIAGNOSIS — M1990 Unspecified osteoarthritis, unspecified site: Secondary | ICD-10-CM | POA: Insufficient documentation

## 2015-03-16 DIAGNOSIS — R29898 Other symptoms and signs involving the musculoskeletal system: Secondary | ICD-10-CM | POA: Insufficient documentation

## 2015-03-16 DIAGNOSIS — F411 Generalized anxiety disorder: Secondary | ICD-10-CM | POA: Insufficient documentation

## 2015-03-16 DIAGNOSIS — I890 Lymphedema, not elsewhere classified: Secondary | ICD-10-CM | POA: Insufficient documentation

## 2015-03-16 DIAGNOSIS — E669 Obesity, unspecified: Secondary | ICD-10-CM | POA: Insufficient documentation

## 2015-03-16 DIAGNOSIS — R011 Cardiac murmur, unspecified: Secondary | ICD-10-CM | POA: Insufficient documentation

## 2015-03-16 DIAGNOSIS — E559 Vitamin D deficiency, unspecified: Secondary | ICD-10-CM | POA: Insufficient documentation

## 2015-03-16 HISTORY — DX: Dysuria: R30.0

## 2015-03-16 HISTORY — DX: Other fatigue: R53.83

## 2015-03-16 MED ORDER — AMOXICILLIN-POT CLAVULANATE 875-125 MG PO TABS: 1.0000 | ORAL_TABLET | Freq: Two times a day (BID) | ORAL | Status: DC

## 2015-03-16 NOTE — Progress Notes (Signed)
Subjective:  Sinusitis The current episode started in the past 7 days. The problem has been gradually worsening since onset. There has been no fever. Associated symptoms include congestion, coughing, ear pain (feel full), headaches, a hoarse voice, sinus pressure, sneezing, a sore throat and swollen glands (tender). Pertinent negatives include no chills, diaphoresis, neck pain or shortness of breath. Past treatments include nothing.   Pt reports that her face and cheek bones hurt and she feels certain she has a sinus infection.   Prior to Admission medications   Medication Sig Start Date End Date Taking? Authorizing Provider  celecoxib (CELEBREX) 200 MG capsule Take by mouth. 07/22/14  Yes Historical Provider, MD  Cholecalciferol (VITAMIN D3) 2000 UNITS TABS Take by mouth. 04/22/13  Yes Historical Provider, MD  diazepam (VALIUM) 5 MG tablet Take by mouth. 04/22/13  Yes Historical Provider, MD  DiphenhydrAMINE HCl, Sleep, 50 MG CAPS Take by mouth. 05/31/11  Yes Historical Provider, MD  fluticasone (FLONASE) 50 MCG/ACT nasal spray Place into the nose. 12/05/13  Yes Historical Provider, MD  HYDROcodone-acetaminophen (NORCO/VICODIN) 5-325 MG per tablet Take by mouth. 06/30/14  Yes Historical Provider, MD  lamoTRIgine (LAMICTAL) 25 MG tablet Take by mouth.   Yes Historical Provider, MD  omeprazole (PRILOSEC) 20 MG capsule Take by mouth. 04/23/14  Yes Historical Provider, MD  sertraline (ZOLOFT) 100 MG tablet Take by mouth. 07/22/14  Yes Historical Provider, MD    Patient Active Problem List   Diagnosis Date Noted  . Difficult or painful urination 03/16/2015  . Fatigue 03/16/2015  . Anxiety, generalized 03/16/2015  . Acid reflux 03/16/2015  . Cardiac murmur 03/16/2015  . H/O major abdominal surgery 03/16/2015  . Benign neoplasm of colon 03/16/2015  . Calculus of kidney 03/16/2015  . Acquired lymphedema 03/16/2015  . Affective disorder, major 03/16/2015  . Arthritis, degenerative 03/16/2015    . Adiposity 03/16/2015  . Raynaud's syndrome 03/16/2015  . Muscle rigidity 03/16/2015  . Fothergill's neuralgia 03/16/2015  . Avitaminosis D 03/16/2015  . DDD (degenerative disc disease), lumbar 11/20/2014  . Neuritis or radiculitis due to rupture of lumbar intervertebral disc 11/20/2014  . Intussusception, ileocecal 08/19/2014  . Lower abdominal pain 08/05/2014  . Diarrhea 08/05/2014  . Abnormal abdominal CT scan 08/05/2014    Past Medical History  Diagnosis Date  . Osteoarthritis   . Neuralgia   . GERD (gastroesophageal reflux disease)   . Raynaud disease   . Depression   . Anxiety   . Lymphedema     History   Social History  . Marital Status: Married    Spouse Name: N/A  . Number of Children: N/A  . Years of Education: N/A   Occupational History  . Not on file.   Social History Main Topics  . Smoking status: Former Smoker -- 5 years  . Smokeless tobacco: Never Used     Comment: Quit smoking in 1980's  . Alcohol Use: 0.6 oz/week    0 Standard drinks or equivalent, 1 Glasses of wine per week  . Drug Use: No  . Sexual Activity: Not on file   Other Topics Concern  . Not on file   Social History Narrative    Allergies  Allergen Reactions  . Benzoin     Blisters.  . Biaxin [Clarithromycin] Diarrhea    GI upset  . Sulfa Antibiotics Itching  . Tegretol  [Carbamazepine] Itching  . Betadine  [Povidone Iodine] Rash    Review of Systems  Constitutional: Negative for chills and diaphoresis.  HENT: Positive for congestion, ear pain (feel full), hoarse voice, sinus pressure, sneezing and sore throat.   Respiratory: Positive for cough. Negative for shortness of breath.   Musculoskeletal: Negative for neck pain.  Neurological: Positive for headaches.    Immunization History  Administered Date(s) Administered  . Tdap 01/26/2006   Objective:  BP 118/70 mmHg  Pulse 80  Temp(Src) 97.9 F (36.6 C) (Oral)  Resp 16  Wt 269 lb (122.018 kg)  SpO2  97%  Physical Exam  Constitutional: She is well-developed, well-nourished, and in no distress.  HENT:  Head: Normocephalic and atraumatic.  Right Ear: External ear normal.  Left Ear: External ear normal.  Nose: Nose normal.  Mouth/Throat: Oropharynx is clear and moist.  B/L maxillary sinus tenderness.  Eyes: Conjunctivae and EOM are normal. Pupils are equal, round, and reactive to light.  Neck: Normal range of motion. Neck supple.  Cardiovascular: Normal rate, regular rhythm, normal heart sounds and intact distal pulses.   Pulmonary/Chest: Effort normal and breath sounds normal.  Psychiatric: Memory, affect and judgment normal.    Lab Results  Component Value Date   WBC 6.1 08/04/2014   HGB 14.3 08/04/2014   HCT 43 08/04/2014   PLT 269 08/04/2014   GLUCOSE 141* 04/17/2013    CMP     Component Value Date/Time   NA 141 08/04/2014   NA 138 04/17/2013 1025   K 4.3 08/04/2014   K 3.4* 04/17/2013 1025   CL 105 04/17/2013 1025   CO2 28 04/17/2013 1025   GLUCOSE 141* 04/17/2013 1025   BUN 16 08/04/2014   BUN 19* 04/17/2013 1025   CREATININE 1.0 08/04/2014   CREATININE 1.75* 04/17/2013 1025   CALCIUM 8.8 04/17/2013 1025   PROT 7.0 04/17/2013 1025   ALBUMIN 3.0* 04/17/2013 1025   AST 19 08/04/2014   AST 27 04/17/2013 1025   ALT 14 08/04/2014   ALT 30 04/17/2013 1025   ALKPHOS 148* 04/17/2013 1025   GFRNONAA 32* 04/17/2013 1025   GFRAA 38* 04/17/2013 1025    Assessment and Plan :  Sinusitis--Rx with Augmentin for 10 day. Push fluids.  I have done the exam and reviewed the above chart and it is accurate to the best of my knowledge.      Miguel Aschoff MD [facility] 03/16/2015 3:10 PM

## 2015-03-16 NOTE — Telephone Encounter (Signed)
Pt advised pathology on right breast skin biopsy was benign.  Will recheck in 6 weeeks

## 2015-04-28 ENCOUNTER — Ambulatory Visit (INDEPENDENT_AMBULATORY_CARE_PROVIDER_SITE_OTHER): Payer: 59 | Admitting: General Surgery

## 2015-04-28 ENCOUNTER — Encounter: Payer: Self-pay | Admitting: General Surgery

## 2015-04-28 VITALS — BP 132/78 | HR 76 | Resp 12 | Ht 73.0 in | Wt 267.0 lb

## 2015-04-28 DIAGNOSIS — I8310 Varicose veins of unspecified lower extremity with inflammation: Secondary | ICD-10-CM

## 2015-04-28 NOTE — Progress Notes (Signed)
Patient ID: Lisa Stein, female   DOB: 1960/11/29, 54 y.o.   MRN: [id number]  Chief Complaint  Patient presents with  . Follow-up    right breast skin excision     HPI Lisa Stein is a 54 y.o. female here today for her 6 week follow up right breast skin biopsy . Patient states she is doing well.  HPI  Past Medical History  Diagnosis Date  . Osteoarthritis   . Neuralgia   . GERD (gastroesophageal reflux disease)   . Raynaud disease   . Depression   . Anxiety   . Lymphedema     Past Surgical History  Procedure Laterality Date  . Replacement total knee  2009  . Total shoulder replacement  [id number]  . Abdominal hysterectomy  2003  . Ankle fracture surgery  2010  . Colonoscopy  2010    Dr. Vira Agar  . Hemicolectomy  08/08/14  . 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  . Colonoscopy  08-07-14    Dr Bary Castilla  . Tooth extraction Right 2016  . Bunionectomy Right   . Sinus problem  2004    Sinuses opened up    Family History  Problem Relation Age of Onset  . Hypertension Mother   . Arthritis Mother   . COPD Mother   . Raynaud syndrome Mother   . Kidney failure Mother   . Heart failure Mother   . Cancer Father     lung & liver cancer  . Raynaud syndrome Sister   . Raynaud syndrome Sister     Social History History  Substance Use Topics  . Smoking status: Former Smoker -- 5 years  . Smokeless tobacco: Never Used     Comment: Quit smoking in 1980's  . Alcohol Use: 0.6 oz/week    0 Standard drinks or equivalent, 1 Glasses of wine per week    Allergies  Allergen Reactions  . Benzoin     Blisters.  . Biaxin [Clarithromycin] Diarrhea    GI upset  . Sulfa Antibiotics Itching  . Tegretol  [Carbamazepine] Itching  . Betadine  [Povidone Iodine] Rash    Current Outpatient Prescriptions  Medication Sig Dispense Refill  . amoxicillin-clavulanate (AUGMENTIN)  875-125 MG per tablet Take 1 tablet by mouth 2 (two) times daily. 20 tablet 2  . celecoxib (CELEBREX) 200 MG capsule Take by mouth.    . Cholecalciferol (VITAMIN D3) 2000 UNITS TABS Take by mouth.    . diazepam (VALIUM) 5 MG tablet Take by mouth.    . DiphenhydrAMINE HCl, Sleep, 50 MG CAPS Take by mouth.    . fluticasone (FLONASE) 50 MCG/ACT nasal spray Place into the nose.    Marland Kitchen HYDROcodone-acetaminophen (NORCO/VICODIN) 5-325 MG per tablet Take by mouth.    . lamoTRIgine (LAMICTAL) 25 MG tablet Take by mouth.    Marland Kitchen omeprazole (PRILOSEC) 20 MG capsule Take by mouth.    . sertraline (ZOLOFT) 100 MG tablet Take by mouth.     No current facility-administered medications for this visit.    Review of Systems Review of Systems  Constitutional: Negative.   Respiratory: Negative.   Cardiovascular: Negative.     Blood pressure 132/78, pulse 76, resp. rate 12, height 6\' 1"  (1.854 m), weight 267 lb (121.11 kg).  Physical Exam Physical Exam  Constitutional: She is oriented to person, place, and time. She appears well-developed and well-nourished.  Eyes: No scleral  icterus.  Neck: Neck supple.  Pulmonary/Chest: Right breast exhibits skin change ( previous redness and induration medial to right areolar is notably improved. currently showing faintest reddish hue about 3 cm diamenter.skin is not indurated. ). Right breast exhibits no inverted nipple, no mass, no nipple discharge and no tenderness.  Lymphadenopathy:    She has no cervical adenopathy.  Neurological: She is alert and oriented to person, place, and time.  Skin: Skin is warm and dry.    Data Reviewed Path report of skin biopsy showed no malignancy.Previous imaging with mammogram and [place] were normal.   Assessment    Resolving focal dermatitis of right breast medially    Plan    Recheck in three months      PCP:  Shearon Balo 04/28/2015, 3:15 PM

## 2015-04-28 NOTE — Patient Instructions (Addendum)
Follow up in 3 months  Call for any increase in redness , pain or palpable mass in right breast area

## 2015-05-12 ENCOUNTER — Other Ambulatory Visit: Payer: Self-pay | Admitting: Family Medicine

## 2015-05-14 ENCOUNTER — Encounter: Payer: Self-pay | Admitting: General Surgery

## 2015-07-13 ENCOUNTER — Ambulatory Visit (INDEPENDENT_AMBULATORY_CARE_PROVIDER_SITE_OTHER): Payer: 59 | Admitting: Family Medicine

## 2015-07-13 ENCOUNTER — Encounter: Payer: Self-pay | Admitting: Family Medicine

## 2015-07-13 VITALS — BP 136/80 | HR 72 | Temp 98.7°F | Resp 16 | Wt 265.8 lb

## 2015-07-13 DIAGNOSIS — M1990 Unspecified osteoarthritis, unspecified site: Secondary | ICD-10-CM

## 2015-07-13 DIAGNOSIS — R5383 Other fatigue: Secondary | ICD-10-CM | POA: Diagnosis not present

## 2015-07-13 DIAGNOSIS — F411 Generalized anxiety disorder: Secondary | ICD-10-CM | POA: Diagnosis not present

## 2015-07-13 DIAGNOSIS — E669 Obesity, unspecified: Secondary | ICD-10-CM

## 2015-07-13 NOTE — Progress Notes (Signed)
Patient: Lisa Stein Female    DOB: 1961/08/19   54 y.o.   MRN: [id number] Visit Date: 07/13/2015  Today's Provider: [name], MD   Chief Complaint  Patient presents with  . Depression  . Anxiety  . Hip Pain   Subjective:    Depression        This is a recurrent problem.  The current episode started more than 1 year ago.   The problem occurs every several days.The problem is unchanged (Right know feels more down).  Associated symptoms include fatigue, insomnia, decreased interest and sad.     The symptoms are aggravated by work stress and family issues.  Compliance with treatment is good.  Past medical history includes anxiety.   Anxiety Presents for follow-up visit. Onset was 1 to 4 weeks ago. Symptoms include depressed mood, excessive worry, hyperventilation, insomnia, irritability and nervous/anxious behavior. Symptoms occur most days. The severity of symptoms is interfering with daily activities (sometimes). The symptoms are aggravated by family issues and work stress. The patient sleeps 7 hours per night. The quality of sleep is good. Nighttime awakenings: one to two.   Compliance with prior treatments has been good.  Hip Pain  The pain is present in the right hip. The quality of the pain is described as aching. The pain is at a severity of 8/10. The pain is moderate. The pain has been worsening since onset. Associated symptoms include an inability to bear weight. Pertinent negatives include no numbness or tingling. Exacerbated by: Laying down.       Allergies  Allergen Reactions  . Benzoin     Blisters.  . Biaxin [Clarithromycin] Diarrhea    GI upset  . Sulfa Antibiotics Itching  . Tegretol  [Carbamazepine] Itching  . Betadine  [Povidone Iodine] Rash   Previous Medications   AMOXICILLIN-CLAVULANATE (AUGMENTIN) 875-125 MG PER TABLET    Take 1 tablet by mouth 2 (two) times daily.   CELECOXIB (CELEBREX) 200 MG CAPSULE    Take by mouth.   CHOLECALCIFEROL (VITAMIN D3) 2000 UNITS TABS    Take by mouth.   DIAZEPAM (VALIUM) 5 MG TABLET    Take by mouth.   DIPHENHYDRAMINE HCL, SLEEP, 50 MG CAPS    Take by mouth.   FLUTICASONE (FLONASE) 50 MCG/ACT NASAL SPRAY    Place into the nose.   HYDROCODONE-ACETAMINOPHEN (NORCO/VICODIN) 5-325 MG PER TABLET    Take by mouth.   OMEPRAZOLE (PRILOSEC) 20 MG CAPSULE    Take 1 capsule by mouth  daily   SERTRALINE (ZOLOFT) 100 MG TABLET    Take by mouth.    Review of Systems  Constitutional: Positive for irritability and fatigue.  HENT: Negative for congestion, postnasal drip, rhinorrhea, sinus pressure, sneezing and sore throat.   Eyes: Negative.   Respiratory: Negative.   Cardiovascular: Negative.   Gastrointestinal: Positive for abdominal pain (started today).  Endocrine: Negative.   Musculoskeletal: Positive for arthralgias.  Allergic/Immunologic: Negative.   Neurological: Negative for tingling and numbness.  Psychiatric/Behavioral: Positive for depression. The patient is nervous/anxious and has insomnia.     Social History  Substance Use Topics  . Smoking status: Former Smoker -- 5 years  . Smokeless tobacco: Never Used     Comment: Quit smoking in 1980's  . Alcohol Use: 0.6 oz/week    0 Standard drinks or equivalent, 1 Glasses of wine per week   Objective:   BP 136/80 mmHg  Pulse 72  Temp(Src) 98.7 F (  37.1 C) (Oral)  Resp 16  Wt 265 lb 12.8 oz (120.566 kg)  Physical Exam  Constitutional: She is oriented to person, place, and time. She appears well-developed and well-nourished.  HENT:  Head: Normocephalic and atraumatic.  Right Ear: External ear normal.  Left Ear: External ear normal.  Nose: Nose normal.  Eyes: Conjunctivae are normal.  Neck: Neck supple.  Cardiovascular: Normal rate, regular rhythm and normal heart sounds.   Pulmonary/Chest: Effort normal and breath sounds normal.  Abdominal: Soft.  Neurological: She is alert and oriented to person, place, and time.    Skin: Skin is warm and dry.  Psychiatric: She has a normal mood and affect. Her behavior is normal. Judgment and thought content normal.        Assessment & Plan:     1. Osteoarthritis, unspecified osteoarthritis type, unspecified site  - TSH - Comprehensive metabolic panel; Future - CBC - Comprehensive metabolic panel - TSH - CBC - Comprehensive metabolic panel  2. Adiposity  - TSH - Comprehensive metabolic panel; Future - CBC - Comprehensive metabolic panel - TSH - CBC - Comprehensive metabolic panel  3. Other fatigue  - TSH - CBC - Comprehensive metabolic panel - amitriptyline (ELAVIL) 25 MG tablet; Take 1 tablet (25 mg total) by mouth at bedtime.  Dispense: 30 tablet; Refill: 12  4. Anxiety, generalized  - amitriptyline (ELAVIL) 25 MG tablet; Take 1 tablet (25 mg total) by mouth at bedtime.  Dispense: 30 tablet; Refill: 12 5.Obesity After lengthy discussion it is very reasonable for patient to see bariatric surgery. Refer to Dr. Kaylyn Lim. Patient would definitely benefit from weight loss and she has not had any luck doing this with her diet and exercise habits. More than 50% of this 30 minute visit is spent in counseling.      Richard Cranford Mon, MD  [facility]

## 2015-07-14 ENCOUNTER — Telehealth: Payer: Self-pay | Admitting: Family Medicine

## 2015-07-14 DIAGNOSIS — E669 Obesity, unspecified: Secondary | ICD-10-CM

## 2015-07-14 MED ORDER — AMITRIPTYLINE HCL 25 MG PO TABS: 25.0000 mg | ORAL_TABLET | Freq: Every day | ORAL | Status: DC

## 2015-07-14 NOTE — Telephone Encounter (Signed)
Please review, what needs to be sent in-aa

## 2015-07-14 NOTE — Telephone Encounter (Signed)
Pt called saying she was in the office yesterday and the RX Dr. Rosanna Randy was to call in was not at the pharmacy.  Can someone please call it in to [facility] in [place].  Her call back is [phone]  [facility]

## 2015-07-14 NOTE — Telephone Encounter (Signed)
done

## 2015-07-16 LAB — COMPREHENSIVE METABOLIC PANEL
A/G RATIO: 2 (ref 1.1–2.5)
ALK PHOS: 81 IU/L (ref 39–117)
ALT: 18 IU/L (ref 0–32)
AST: 22 IU/L (ref 0–40)
Albumin: 4.6 g/dL (ref 3.5–5.5)
BILIRUBIN TOTAL: 0.3 mg/dL (ref 0.0–1.2)
BUN/Creatinine Ratio: 18 (ref 9–23)
BUN: 20 mg/dL (ref 6–24)
CHLORIDE: 101 mmol/L (ref 97–108)
CO2: 24 mmol/L (ref 18–29)
Calcium: 10.1 mg/dL (ref 8.7–10.2)
Creatinine, Ser: 1.09 mg/dL — ABNORMAL HIGH (ref 0.57–1.00)
GFR calc Af Amer: 66 mL/min/{1.73_m2} (ref >59)
GFR calc non Af Amer: 57 mL/min/{1.73_m2} — ABNORMAL LOW (ref >59)
GLOBULIN, TOTAL: 2.3 g/dL (ref 1.5–4.5)
Glucose: 95 mg/dL (ref 65–99)
Potassium: 4.5 mmol/L (ref 3.5–5.2)
SODIUM: 143 mmol/L (ref 134–144)
Total Protein: 6.9 g/dL (ref 6.0–8.5)

## 2015-07-16 LAB — CBC
HEMATOCRIT: 44 % (ref 34.0–46.6)
Hemoglobin: 14.3 g/dL (ref 11.1–15.9)
MCH: 27 pg (ref 26.6–33.0)
MCHC: 32.5 g/dL (ref 31.5–35.7)
MCV: 83 fL (ref 79–97)
PLATELETS: 276 10*3/uL (ref 150–379)
RBC: 5.3 x10E6/uL — ABNORMAL HIGH (ref 3.77–5.28)
RDW: 14 % (ref 12.3–15.4)
WBC: 5.2 10*3/uL (ref 3.4–10.8)

## 2015-07-16 LAB — TSH: TSH: 1.84 u[IU]/mL (ref 0.450–4.500)

## 2015-07-17 ENCOUNTER — Telehealth: Payer: Self-pay

## 2015-07-17 NOTE — Telephone Encounter (Signed)
Advised  ED 

## 2015-07-17 NOTE — Telephone Encounter (Signed)
-----   Message from Jerrol Banana., MD sent at 07/16/2015  4:21 PM EDT ----- Labs normal.

## 2015-07-24 ENCOUNTER — Other Ambulatory Visit: Payer: Self-pay | Admitting: Family Medicine

## 2015-07-28 ENCOUNTER — Ambulatory Visit: Payer: 59 | Admitting: General Surgery

## 2015-08-05 ENCOUNTER — Ambulatory Visit: Payer: Self-pay | Admitting: Family Medicine

## 2015-09-07 ENCOUNTER — Telehealth: Payer: Self-pay | Admitting: Family Medicine

## 2015-09-07 MED ORDER — OMEPRAZOLE 20 MG PO CPDR: 20.0000 mg | DELAYED_RELEASE_CAPSULE | Freq: Two times a day (BID) | ORAL | Status: DC

## 2015-09-07 NOTE — Telephone Encounter (Signed)
Lmtcb, i already sent in RX for BID directions to the pharmacy mentioned below-aa

## 2015-09-07 NOTE — Telephone Encounter (Signed)
Pt advised, she has appt on Jan 3rd.-aa

## 2015-09-07 NOTE — Telephone Encounter (Signed)
Go to BID and see me 2-6 weeks. I hope he feels better quickly.

## 2015-09-07 NOTE — Telephone Encounter (Signed)
Pt stated that the omeprazole (PRILOSEC) 20 MG capsule is helping but it hasn't been lasting all day and wanted to know if she could take the medication once in the morning and again in the evening. Pt uses [facility]. Pt also wanted to let Dr. Rosanna Randy know that her husband Claudius Sis 10/18[birth year]54 is in ICU at [facility]. Thanks TNP

## 2015-09-07 NOTE — Telephone Encounter (Signed)
Please review-aa 

## 2015-09-08 ENCOUNTER — Other Ambulatory Visit: Payer: Self-pay | Admitting: Family Medicine

## 2015-09-10 ENCOUNTER — Other Ambulatory Visit: Payer: Self-pay

## 2015-09-10 DIAGNOSIS — F411 Generalized anxiety disorder: Secondary | ICD-10-CM

## 2015-09-10 DIAGNOSIS — R5383 Other fatigue: Secondary | ICD-10-CM

## 2015-09-10 MED ORDER — AMITRIPTYLINE HCL 25 MG PO TABS: 25.0000 mg | ORAL_TABLET | Freq: Every day | ORAL | Status: DC

## 2015-09-23 ENCOUNTER — Encounter: Payer: Self-pay | Admitting: *Deleted

## 2015-10-01 ENCOUNTER — Other Ambulatory Visit: Payer: Self-pay

## 2015-10-01 MED ORDER — OMEPRAZOLE 20 MG PO CPDR: 20.0000 mg | DELAYED_RELEASE_CAPSULE | Freq: Two times a day (BID) | ORAL | Status: DC

## 2015-10-06 ENCOUNTER — Ambulatory Visit (INDEPENDENT_AMBULATORY_CARE_PROVIDER_SITE_OTHER): Payer: 59 | Admitting: Family Medicine

## 2015-10-06 VITALS — BP 134/88 | HR 74 | Temp 98.4°F | Resp 14 | Wt 277.0 lb

## 2015-10-06 DIAGNOSIS — N951 Menopausal and female climacteric states: Secondary | ICD-10-CM | POA: Diagnosis not present

## 2015-10-06 DIAGNOSIS — K219 Gastro-esophageal reflux disease without esophagitis: Secondary | ICD-10-CM

## 2015-10-06 DIAGNOSIS — F411 Generalized anxiety disorder: Secondary | ICD-10-CM

## 2015-10-06 DIAGNOSIS — E669 Obesity, unspecified: Secondary | ICD-10-CM

## 2015-10-06 MED ORDER — VENLAFAXINE HCL 37.5 MG PO TABS: 37.5000 mg | ORAL_TABLET | Freq: Two times a day (BID) | ORAL | Status: DC

## 2015-10-06 MED ORDER — SERTRALINE HCL 100 MG PO TABS: 100.0000 mg | ORAL_TABLET | Freq: Every day | ORAL | Status: DC

## 2015-10-06 NOTE — Progress Notes (Signed)
Patient ID: Lisa Stein, female   DOB: Mar 22, 1961, 55 y.o.   MRN: [id number]    Subjective:  HPI  Patient is here for 3 months follow up.  Anxiety/depression follow up: Patient states she is ok/stable-stressed. Anxious. Symptoms are about unchanged.  GERD follow up: Patient called after her last visit and asked if we can increase Omeprazole to 2 tablets daily but she has not been able to take this dose daily regularly due to having issues with RX and pharmacy dispensing it correctly. [facility] is on the way and hopefully it will be with the right directions.  Patient wanted to discuss menopause. She saw Jackelyn Poling for this before at least 2 years ago or and she was put on Bellager but then she ran out and pharmacy did not have the compound to make it. She has not needed it though until about 2 to 3 months ago she developed symptoms again-night sweats, mood swings and face is breaking out.   She has routine labs done in October 2016.  Prior to Admission medications   Medication Sig Start Date End Date Taking? Authorizing Provider  amitriptyline (ELAVIL) 25 MG tablet Take 1 tablet (25 mg total) by mouth at bedtime. 09/10/15  Yes Kramer Hanrahan Maceo Pro., MD  celecoxib (CELEBREX) 200 MG capsule Take 1 capsule by mouth  daily 09/09/15  Yes Lemmie Vanlanen Maceo Pro., MD  Cholecalciferol (VITAMIN D3) 2000 UNITS TABS Take by mouth. 04/22/13  Yes Historical Provider, MD  diazepam (VALIUM) 5 MG tablet Take by mouth. 04/22/13  Yes Historical Provider, MD  DiphenhydrAMINE HCl, Sleep, 50 MG CAPS Take by mouth. 05/31/11  Yes Historical Provider, MD  fluticasone (FLONASE) 50 MCG/ACT nasal spray Place into the nose. 12/05/13  Yes Historical Provider, MD  omeprazole (PRILOSEC) 20 MG capsule Take 1 capsule (20 mg total) by mouth 2 (two) times daily before a meal. 10/01/15  Yes Jerrol Banana., MD  sertraline (ZOLOFT) 100 MG tablet Take 2 tablets by mouth  daily 07/24/15  Yes Nas Wafer Maceo Pro., MD    HYDROcodone-acetaminophen (NORCO/VICODIN) 5-325 MG per tablet Take by mouth. Reported on 10/06/2015 06/30/14   Historical Provider, MD    Patient Active Problem List   Diagnosis Date Noted  . Difficult or painful urination 03/16/2015  . Fatigue 03/16/2015  . Anxiety, generalized 03/16/2015  . Acid reflux 03/16/2015  . Cardiac murmur 03/16/2015  . H/O major abdominal surgery 03/16/2015  . Benign neoplasm of colon 03/16/2015  . Calculus of kidney 03/16/2015  . Acquired lymphedema 03/16/2015  . Affective disorder, major ([place]) 03/16/2015  . Arthritis, degenerative 03/16/2015  . Adiposity 03/16/2015  . Raynaud's syndrome 03/16/2015  . Muscle rigidity 03/16/2015  . Fothergill's neuralgia 03/16/2015  . Avitaminosis D 03/16/2015  . DDD (degenerative disc disease), lumbar 11/20/2014  . Neuritis or radiculitis due to rupture of lumbar intervertebral disc 11/20/2014  . Intussusception, ileocecal ([place]) 08/19/2014  . Lower abdominal pain 08/05/2014  . Diarrhea 08/05/2014  . Abnormal abdominal CT scan 08/05/2014    Past Medical History  Diagnosis Date  . Osteoarthritis   . Neuralgia   . GERD (gastroesophageal reflux disease)   . Raynaud disease   . Depression   . Anxiety   . Lymphedema     Social History   Social History  . Marital Status: Married    Spouse Name: N/A  . Number of Children: N/A  . Years of Education: N/A   Occupational History  . Not on file.  Social History Main Topics  . Smoking status: Former Smoker -- 5 years  . Smokeless tobacco: Never Used     Comment: Quit smoking in 1980's  . Alcohol Use: 0.6 oz/week    0 Standard drinks or equivalent, 1 Glasses of wine per week  . Drug Use: No  . Sexual Activity: Not on file   Other Topics Concern  . Not on file   Social History Narrative    Allergies  Allergen Reactions  . Benzoin     Blisters.  . Biaxin [Clarithromycin] Diarrhea    GI upset  . Sulfa Antibiotics Itching  . Tegretol  [Carbamazepine]  Itching  . Betadine  [Povidone Iodine] Rash    Review of Systems  Constitutional: Negative.        Night sweats  Respiratory: Negative.   Cardiovascular: Negative.   Gastrointestinal: Positive for heartburn.  Musculoskeletal: Positive for joint pain.  Skin: Positive for rash.  Psychiatric/Behavioral: The patient is nervous/anxious.     Immunization History  Administered Date(s) Administered  . Tdap 01/26/2006   Objective:  BP 134/88 mmHg  Pulse 74  Temp(Src) 98.4 F (36.9 C)  Resp 14  Wt 277 lb (125.646 kg)  Physical Exam  Constitutional: She is oriented to person, place, and time and well-developed, well-nourished, and in no distress.  HENT:  Head: Normocephalic and atraumatic.  Eyes: Conjunctivae are normal. Pupils are equal, round, and reactive to light.  Neck: Normal range of motion. Neck supple.  Cardiovascular: Normal rate, regular rhythm, normal heart sounds and intact distal pulses.   No murmur heard. Pulmonary/Chest: Effort normal and breath sounds normal. No respiratory distress. She has no wheezes.  Abdominal: Soft.  Musculoskeletal: Normal range of motion. She exhibits no edema or tenderness.  Neurological: She is alert and oriented to person, place, and time. Gait normal.  Skin: Skin is warm and dry.  Psychiatric: Mood, memory, affect and judgment normal.    Lab Results  Component Value Date   WBC 5.2 07/15/2015   HGB 14.3 08/04/2014   HCT 44.0 07/15/2015   PLT 269 08/04/2014   GLUCOSE 95 07/15/2015   TSH 1.840 07/15/2015    CMP     Component Value Date/Time   NA 143 07/15/2015 0840   NA 138 04/17/2013 1025   K 4.5 07/15/2015 0840   K 3.4* 04/17/2013 1025   CL 101 07/15/2015 0840   CL 105 04/17/2013 1025   CO2 24 07/15/2015 0840   CO2 28 04/17/2013 1025   GLUCOSE 95 07/15/2015 0840   GLUCOSE 141* 04/17/2013 1025   BUN 20 07/15/2015 0840   BUN 19* 04/17/2013 1025   CREATININE 1.09* 07/15/2015 0840   CREATININE 1.0 08/04/2014    CREATININE 1.75* 04/17/2013 1025   CALCIUM 10.1 07/15/2015 0840   CALCIUM 8.8 04/17/2013 1025   PROT 6.9 07/15/2015 0840   PROT 7.0 04/17/2013 1025   ALBUMIN 4.6 07/15/2015 0840   ALBUMIN 3.0* 04/17/2013 1025   AST 22 07/15/2015 0840   AST 27 04/17/2013 1025   ALT 18 07/15/2015 0840   ALT 30 04/17/2013 1025   ALKPHOS 81 07/15/2015 0840   ALKPHOS 148* 04/17/2013 1025   BILITOT 0.3 07/15/2015 0840   BILITOT 0.6 04/17/2013 1025   GFRNONAA 57* 07/15/2015 0840   GFRNONAA 32* 04/17/2013 1025   GFRAA 66 07/15/2015 0840   GFRAA 38* 04/17/2013 1025    Assessment and Plan :  1. Gastroesophageal reflux disease, esophagitis presence not specified Awaiting for the new RX Omeprazole  for BID and will re check on the next visit.  2. Anxiety, generalized Stable. Will decrease Sertraline to 1 tablet daily since we are adding Effexor for her menopausal symptoms. - sertraline (ZOLOFT) 100 MG tablet; Take 1 tablet (100 mg total) by mouth daily.  Dispense: 180 tablet; Refill: 3  3. Adiposity  4. Menopausal symptom Will try Effexor. Patient still has ovaries but does not have cervix anymore. Will re check on the next visit. - venlafaxine (EFFEXOR) 37.5 MG tablet; Take 1 tablet (37.5 mg total) by mouth 2 (two) times daily.  Dispense: 60 tablet; Refill: 5 - [facility] - [facility] Bellergal no longer available.Consider HRT. Also advised patient to stop Amitriptyline.  Patient was seen and examined by Dr. Eulas Post and note was scribed by Theressa Millard, RMA.  Miguel Aschoff MD [facility] 10/06/2015 1:56 PM

## 2015-10-09 LAB — FOLLICLE STIMULATING HORMONE: FSH: 72.7 m[IU]/mL

## 2015-10-09 LAB — LUTEINIZING HORMONE: LH: 45.9 m[IU]/mL

## 2015-10-19 ENCOUNTER — Encounter: Payer: Self-pay | Admitting: Family Medicine

## 2015-10-20 ENCOUNTER — Encounter: Payer: Self-pay | Admitting: Emergency Medicine

## 2015-11-02 ENCOUNTER — Ambulatory Visit (INDEPENDENT_AMBULATORY_CARE_PROVIDER_SITE_OTHER): Payer: 59 | Admitting: Family Medicine

## 2015-11-02 ENCOUNTER — Encounter: Payer: Self-pay | Admitting: Family Medicine

## 2015-11-02 VITALS — BP 122/78 | HR 78 | Temp 98.4°F | Resp 16 | Wt 280.0 lb

## 2015-11-02 DIAGNOSIS — M1990 Unspecified osteoarthritis, unspecified site: Secondary | ICD-10-CM | POA: Diagnosis not present

## 2015-11-02 DIAGNOSIS — K219 Gastro-esophageal reflux disease without esophagitis: Secondary | ICD-10-CM

## 2015-11-02 DIAGNOSIS — N951 Menopausal and female climacteric states: Secondary | ICD-10-CM

## 2015-11-02 DIAGNOSIS — F411 Generalized anxiety disorder: Secondary | ICD-10-CM | POA: Diagnosis not present

## 2015-11-02 DIAGNOSIS — E669 Obesity, unspecified: Secondary | ICD-10-CM | POA: Diagnosis not present

## 2015-11-02 NOTE — Progress Notes (Signed)
Patient ID: Lisa Stein, female   DOB: Mar 11, 1961, 55 y.o.   MRN: [id number]    Subjective:  HPI Pt is here for a follow up of menopausal symptoms and anxiety. Venlafaxine was started and stopped Sertraline. She could not take the venlafaxine because it made her dizzy when she laid down, so she started taking the Sertraline again.   Prior to Admission medications   Medication Sig Start Date End Date Taking? Authorizing Provider  celecoxib (CELEBREX) 200 MG capsule Take 1 capsule by mouth  daily 09/09/15  Yes Richard Maceo Pro., MD  Cholecalciferol (VITAMIN D3) 2000 UNITS TABS Take by mouth. 04/22/13  Yes Historical Provider, MD  diazepam (VALIUM) 5 MG tablet Take by mouth. 04/22/13  Yes Historical Provider, MD  DiphenhydrAMINE HCl, Sleep, 50 MG CAPS Take by mouth. 05/31/11  Yes Historical Provider, MD  fluticasone (FLONASE) 50 MCG/ACT nasal spray Place into the nose. 12/05/13  Yes Historical Provider, MD  HYDROcodone-acetaminophen (NORCO/VICODIN) 5-325 MG per tablet Take by mouth. Reported on 10/06/2015 06/30/14  Yes Historical Provider, MD  omeprazole (PRILOSEC) 20 MG capsule Take 1 capsule (20 mg total) by mouth 2 (two) times daily before a meal. 10/01/15  Yes Jerrol Banana., MD  sertraline (ZOLOFT) 100 MG tablet Take 1 tablet (100 mg total) by mouth daily. 10/06/15  Yes Richard Maceo Pro., MD  venlafaxine (EFFEXOR) 37.5 MG tablet Take 1 tablet (37.5 mg total) by mouth 2 (two) times daily. Patient not taking: Reported on 11/02/2015 10/06/15   Jerrol Banana., MD    Patient Active Problem List   Diagnosis Date Noted  . Difficult or painful urination 03/16/2015  . Fatigue 03/16/2015  . Anxiety, generalized 03/16/2015  . Acid reflux 03/16/2015  . Cardiac murmur 03/16/2015  . H/O major abdominal surgery 03/16/2015  . Benign neoplasm of colon 03/16/2015  . Calculus of kidney 03/16/2015  . Acquired lymphedema 03/16/2015  . Affective disorder, major ([place]) 03/16/2015  . Arthritis,  degenerative 03/16/2015  . Adiposity 03/16/2015  . Raynaud's syndrome 03/16/2015  . Muscle rigidity 03/16/2015  . Fothergill's neuralgia 03/16/2015  . Avitaminosis D 03/16/2015  . DDD (degenerative disc disease), lumbar 11/20/2014  . Neuritis or radiculitis due to rupture of lumbar intervertebral disc 11/20/2014  . Intussusception, ileocecal ([place]) 08/19/2014  . Lower abdominal pain 08/05/2014  . Diarrhea 08/05/2014  . Abnormal abdominal CT scan 08/05/2014    Past Medical History  Diagnosis Date  . Osteoarthritis   . Neuralgia   . GERD (gastroesophageal reflux disease)   . Raynaud disease   . Depression   . Anxiety   . Lymphedema     Social History   Social History  . Marital Status: Married    Spouse Name: N/A  . Number of Children: N/A  . Years of Education: N/A   Occupational History  . Not on file.   Social History Main Topics  . Smoking status: Former Smoker -- 5 years  . Smokeless tobacco: Never Used     Comment: Quit smoking in 1980's  . Alcohol Use: 0.6 oz/week    0 Standard drinks or equivalent, 1 Glasses of wine per week  . Drug Use: No  . Sexual Activity: Not on file   Other Topics Concern  . Not on file   Social History Narrative    Allergies  Allergen Reactions  . Benzoin     Blisters.  . Biaxin [Clarithromycin] Diarrhea    GI upset  . Sulfa Antibiotics Itching  .  Tegretol  [Carbamazepine] Itching  . Betadine  [Povidone Iodine] Rash    Review of Systems  Constitutional: Negative.   HENT: Negative.   Eyes: Negative.   Respiratory: Negative.   Cardiovascular: Negative.   Gastrointestinal: Negative.   Genitourinary: Negative.   Musculoskeletal: Negative.   Skin: Negative.   Neurological: Negative.   Endo/Heme/Allergies: Negative.   Psychiatric/Behavioral: Negative.     Immunization History  Administered Date(s) Administered  . Tdap 01/26/2006   Objective:  BP 122/78 mmHg  Pulse 78  Temp(Src) 98.4 F (36.9 C) (Oral)  Resp  16  Wt 280 lb (127.007 kg)  Physical Exam  Constitutional: She is oriented to person, place, and time and well-developed, well-nourished, and in no distress.  Eyes: Conjunctivae and EOM are normal. Pupils are equal, round, and reactive to light.  Neck: Normal range of motion. Neck supple.  Cardiovascular: Normal rate, regular rhythm, normal heart sounds and intact distal pulses.   Pulmonary/Chest: Effort normal and breath sounds normal.  Neurological: She is alert and oriented to person, place, and time. She has normal reflexes. Gait normal. GCS score is 15.  Skin: Skin is warm and dry.  Psychiatric: Mood, memory, affect and judgment normal.    Lab Results  Component Value Date   WBC 5.2 07/15/2015   HGB 14.3 08/04/2014   HCT 44.0 07/15/2015   PLT 276 07/15/2015   GLUCOSE 95 07/15/2015   TSH 1.840 07/15/2015    CMP     Component Value Date/Time   NA 143 07/15/2015 0840   NA 138 04/17/2013 1025   K 4.5 07/15/2015 0840   K 3.4* 04/17/2013 1025   CL 101 07/15/2015 0840   CL 105 04/17/2013 1025   CO2 24 07/15/2015 0840   CO2 28 04/17/2013 1025   GLUCOSE 95 07/15/2015 0840   GLUCOSE 141* 04/17/2013 1025   BUN 20 07/15/2015 0840   BUN 19* 04/17/2013 1025   CREATININE 1.09* 07/15/2015 0840   CREATININE 1.0 08/04/2014   CREATININE 1.75* 04/17/2013 1025   CALCIUM 10.1 07/15/2015 0840   CALCIUM 8.8 04/17/2013 1025   PROT 6.9 07/15/2015 0840   PROT 7.0 04/17/2013 1025   ALBUMIN 4.6 07/15/2015 0840   ALBUMIN 3.0* 04/17/2013 1025   AST 22 07/15/2015 0840   AST 27 04/17/2013 1025   ALT 18 07/15/2015 0840   ALT 30 04/17/2013 1025   ALKPHOS 81 07/15/2015 0840   ALKPHOS 148* 04/17/2013 1025   BILITOT 0.3 07/15/2015 0840   BILITOT 0.6 04/17/2013 1025   GFRNONAA 57* 07/15/2015 0840   GFRNONAA 32* 04/17/2013 1025   GFRAA 66 07/15/2015 0840   GFRAA 38* 04/17/2013 1025    Assessment and Plan :  1. Menopausal symptom Tolerable. Will hold off on HRT.   2. Anxiety,  generalized Controlled.  3. Gastroesophageal reflux disease, esophagitis presence not specified   4. Osteoarthritis, unspecified osteoarthritis type, unspecified site   5. Adiposity   Patient was seen and examined by Dr. Miguel Aschoff, and noted scribed by Webb Laws, [place] [facility] 11/02/2015 2:00 PM

## 2015-11-03 ENCOUNTER — Ambulatory Visit: Payer: 59 | Admitting: Family Medicine

## 2015-11-30 ENCOUNTER — Encounter: Payer: Self-pay | Admitting: Emergency Medicine

## 2015-11-30 ENCOUNTER — Emergency Department
Admission: EM | Admit: 2015-11-30 | Discharge: 2015-11-30 | Disposition: A | Discharge status: Home / Self Care | Payer: 59 | Attending: Emergency Medicine | Admitting: Emergency Medicine

## 2015-11-30 ENCOUNTER — Emergency Department: Payer: 59

## 2015-11-30 DIAGNOSIS — S8991XA Unspecified injury of right lower leg, initial encounter: Secondary | ICD-10-CM | POA: Diagnosis present

## 2015-11-30 DIAGNOSIS — S86811A Strain of other muscle(s) and tendon(s) at lower leg level, right leg, initial encounter: Secondary | ICD-10-CM | POA: Diagnosis not present

## 2015-11-30 DIAGNOSIS — Z791 Long term (current) use of non-steroidal anti-inflammatories (NSAID): Secondary | ICD-10-CM | POA: Insufficient documentation

## 2015-11-30 DIAGNOSIS — Z87891 Personal history of nicotine dependence: Secondary | ICD-10-CM | POA: Insufficient documentation

## 2015-11-30 DIAGNOSIS — X58XXXA Exposure to other specified factors, initial encounter: Secondary | ICD-10-CM | POA: Diagnosis not present

## 2015-11-30 DIAGNOSIS — Y9289 Other specified places as the place of occurrence of the external cause: Secondary | ICD-10-CM | POA: Diagnosis not present

## 2015-11-30 DIAGNOSIS — Y998 Other external cause status: Secondary | ICD-10-CM | POA: Diagnosis not present

## 2015-11-30 DIAGNOSIS — Y9389 Activity, other specified: Secondary | ICD-10-CM | POA: Diagnosis not present

## 2015-11-30 DIAGNOSIS — Z79899 Other long term (current) drug therapy: Secondary | ICD-10-CM | POA: Insufficient documentation

## 2015-11-30 DIAGNOSIS — S86911A Strain of unspecified muscle(s) and tendon(s) at lower leg level, right leg, initial encounter: Secondary | ICD-10-CM

## 2015-11-30 MED ORDER — HYDROCODONE-ACETAMINOPHEN 5-325 MG PO TABS
1.0000 | ORAL_TABLET | ORAL | Status: DC | PRN
Start: 2015-11-30 — End: 2017-05-19

## 2015-11-30 MED ORDER — ONDANSETRON 4 MG PO TBDP
ORAL_TABLET | ORAL | Status: AC
  Filled 2015-11-30: qty 1

## 2015-11-30 MED ORDER — KETOROLAC TROMETHAMINE 60 MG/2ML IM SOLN
60.0000 mg | Freq: Once | INTRAMUSCULAR | Status: AC
  Administered 2015-11-30: 60 mg via INTRAMUSCULAR
  Filled 2015-11-30: qty 2

## 2015-11-30 MED ORDER — HYDROMORPHONE HCL 1 MG/ML IJ SOLN
1.0000 mg | Freq: Once | INTRAMUSCULAR | Status: AC
  Administered 2015-11-30: 1 mg via INTRAMUSCULAR
  Filled 2015-11-30: qty 1

## 2015-11-30 MED ORDER — ONDANSETRON 4 MG PO TBDP
4.0000 mg | ORAL_TABLET | Freq: Once | ORAL | Status: AC
  Administered 2015-11-30: 4 mg via ORAL

## 2015-11-30 NOTE — ED Provider Notes (Signed)
[facility] Emergency Department Provider Note  ____________________________________________  Time seen: Approximately 2:41 PM  I have reviewed the triage vital signs and the nursing notes.   HISTORY  Chief Complaint Knee Pain    HPI Lisa Stein is a 55 y.o. female presents for evaluation of right knee pain 2 days. Patient states that she's got a past medical history significant for arthralgia and knee problems and is trying to prolonged surgery. States that she was going up and down the steps when she felt the achiness increase in her knee. Describes her pain as 8 out of 10 and radiates proximally to the right hip. Denies any direct tenderness states at rest elevation and heat does help the knee. Worsened with steps and walking.   Past Medical History  Diagnosis Date  . Osteoarthritis   . Neuralgia   . GERD (gastroesophageal reflux disease)   . Raynaud disease   . Depression   . Anxiety   . Lymphedema     Patient Active Problem List   Diagnosis Date Noted  . Difficult or painful urination 03/16/2015  . Fatigue 03/16/2015  . Anxiety, generalized 03/16/2015  . Acid reflux 03/16/2015  . Cardiac murmur 03/16/2015  . H/O major abdominal surgery 03/16/2015  . Benign neoplasm of colon 03/16/2015  . Calculus of kidney 03/16/2015  . Acquired lymphedema 03/16/2015  . Affective disorder, major ([place]) 03/16/2015  . Arthritis, degenerative 03/16/2015  . Adiposity 03/16/2015  . Raynaud's syndrome 03/16/2015  . Muscle rigidity 03/16/2015  . Fothergill's neuralgia 03/16/2015  . Avitaminosis D 03/16/2015  . DDD (degenerative disc disease), lumbar 11/20/2014  . Neuritis or radiculitis due to rupture of lumbar intervertebral disc 11/20/2014  . Intussusception, ileocecal ([place]) 08/19/2014  . Lower abdominal pain 08/05/2014  . Diarrhea 08/05/2014  . Abnormal abdominal CT scan 08/05/2014    Past Surgical History  Procedure Laterality Date  .  Replacement total knee  2009  . Total shoulder replacement  [id number]  . Abdominal hysterectomy  2003  . Ankle fracture surgery  2010  . Colonoscopy  2010    Dr. Vira Agar  . Hemicolectomy  08/08/14  . 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  . Colonoscopy  08-07-14    Dr Bary Castilla  . Tooth extraction Right 2016  . Bunionectomy Right   . Sinus problem  2004    Sinuses opened up    Current Outpatient Rx  Name  Route  Sig  Dispense  Refill  . celecoxib (CELEBREX) 200 MG capsule      Take 1 capsule by mouth  daily   90 capsule   3   . Cholecalciferol (VITAMIN D3) 2000 UNITS TABS   Oral   Take by mouth.         . diazepam (VALIUM) 5 MG tablet   Oral   Take by mouth.         . DiphenhydrAMINE HCl, Sleep, 50 MG CAPS   Oral   Take by mouth.         . fluticasone (FLONASE) 50 MCG/ACT nasal spray   Nasal   Place into the nose.         Marland Kitchen HYDROcodone-acetaminophen (NORCO) 5-325 MG tablet   Oral   Take 1-2 tablets by mouth every 4 (four) hours as needed for moderate pain.   15 tablet   0   . omeprazole (PRILOSEC) 20 MG capsule  Oral   Take 1 capsule (20 mg total) by mouth 2 (two) times daily before a meal.   180 capsule   2     Please fill this RX for 2 times daily. Patient sta ...   . sertraline (ZOLOFT) 100 MG tablet   Oral   Take 1 tablet (100 mg total) by mouth daily.   180 tablet   3     Allergies Benzoin; Biaxin; Sulfa antibiotics; Tegretol ; and Betadine   Family History  Problem Relation Age of Onset  . Hypertension Mother   . Arthritis Mother   . COPD Mother   . Raynaud syndrome Mother   . Kidney failure Mother   . Heart failure Mother   . Cancer Father     lung & liver cancer  . Raynaud syndrome Sister   . Raynaud syndrome Sister     Social History Social History  Substance Use Topics  . Smoking status: Former Smoker -- 5 years  .  Smokeless tobacco: Never Used     Comment: Quit smoking in 1980's  . Alcohol Use: 0.6 oz/week    0 Standard drinks or equivalent, 1 Glasses of wine per week    Review of Systems Constitutional: No fever/chills Eyes: No visual changes. ENT: No sore throat. Cardiovascular: Denies chest pain. Respiratory: Denies shortness of breath. Gastrointestinal: No abdominal pain.  No nausea, no vomiting.  No diarrhea.  No constipation. Genitourinary: Negative for dysuria. Musculoskeletal: Negative for back pain. Skin: Negative for rash. Neurological: Negative for headaches, focal weakness or numbness.  10-point ROS otherwise negative.  ____________________________________________   PHYSICAL EXAM:  VITAL SIGNS: ED Triage Vitals  Enc Vitals Group     BP 11/30/15 1312 145/78 mmHg     Pulse Rate 11/30/15 1312 83     Resp 11/30/15 1312 18     Temp 11/30/15 1312 98.1 F (36.7 C)     Temp Source 11/30/15 1312 Oral     SpO2 11/30/15 1312 95 %     Weight 11/30/15 1312 276 lb (125.193 kg)     Height 11/30/15 1312 6\' 1"  (1.854 m)     Head Cir --      Peak Flow --      Pain Score 11/30/15 1313 8     Pain Loc --      Pain Edu? --      Excl. in [place]? --     Constitutional: Alert and oriented. Well appearing and in no acute distress. Eyes: Conjunctivae are normal. PERRL. EOMI. Head: Atraumatic. Nose: No congestion/rhinnorhea. Mouth/Throat: Mucous membranes are moist.  Oropharynx non-erythematous. Neck: No stridor.   Cardiovascular: Normal rate, regular rhythm. Grossly normal heart sounds.  Good peripheral circulation. Respiratory: Normal respiratory effort.  No retractions. Lungs CTAB. Gastrointestinal: Soft and nontender. No distention. No abdominal bruits. No CVA tenderness. Musculoskeletal: No lower extremity tenderness nor edema.  No joint effusions. Neurologic:  Normal speech and language. No gross focal neurologic deficits are appreciated. No gait instability. Skin:  Skin is warm, dry  and intact. No rash noted. Psychiatric: Mood and affect are normal. Speech and behavior are normal.  ____________________________________________   LABS (all labs ordered are listed, but only abnormal results are displayed)  Labs Reviewed - No data to display ____________________________________________   RADIOLOGY  Chronic osteoarthritic changes. No acute fracture or effusion noted. ____________________________________________   PROCEDURES  Procedure(s) performed: None  Critical Care performed: No  ____________________________________________   INITIAL IMPRESSION / ASSESSMENT AND PLAN / ED  COURSE  Pertinent labs & imaging results that were available during my care of the patient were reviewed by me and considered in my medical decision making (see chart for details).  Acute exacerbation of right knee pain. We'll provide Toradol 60 mg IM and Dilaudid 1 mg IM. We'll discharge home with prescription for  Vicodin 5/325. Patient to follow up with her PCP and orthopedic doctor tomorrow as scheduled. Patient voices no other emergency medical complaints at this time. ____________________________________________   FINAL CLINICAL IMPRESSION(S) / ED DIAGNOSES  Final diagnoses:  Knee strain, right, initial encounter     This chart was dictated using voice recognition software/Dragon. Despite best efforts to proofread, errors can occur which can change the meaning. Any change was purely unintentional.   Arlyss Repress, PA-C 11/30/15 1629  Lisa Newport, MD 11/30/15 [phone]

## 2015-11-30 NOTE — Discharge Instructions (Signed)

## 2015-11-30 NOTE — ED Notes (Signed)
Pain R knee since going up and down stairs more than usual 2 days ago.

## 2016-01-12 ENCOUNTER — Encounter: Payer: Self-pay | Admitting: Physician Assistant

## 2016-01-12 ENCOUNTER — Ambulatory Visit (INDEPENDENT_AMBULATORY_CARE_PROVIDER_SITE_OTHER): Payer: 59 | Admitting: Physician Assistant

## 2016-01-12 VITALS — BP 138/80 | HR 72 | Temp 98.2°F | Resp 16 | Wt 274.2 lb

## 2016-01-12 DIAGNOSIS — J069 Acute upper respiratory infection, unspecified: Secondary | ICD-10-CM | POA: Diagnosis not present

## 2016-01-12 DIAGNOSIS — F32A Depression, unspecified: Secondary | ICD-10-CM | POA: Insufficient documentation

## 2016-01-12 DIAGNOSIS — R05 Cough: Secondary | ICD-10-CM

## 2016-01-12 DIAGNOSIS — F329 Major depressive disorder, single episode, unspecified: Secondary | ICD-10-CM | POA: Insufficient documentation

## 2016-01-12 DIAGNOSIS — R059 Cough, unspecified: Secondary | ICD-10-CM

## 2016-01-12 MED ORDER — AMOXICILLIN-POT CLAVULANATE 875-125 MG PO TABS: 1.0000 | ORAL_TABLET | Freq: Two times a day (BID) | ORAL | Status: DC

## 2016-01-12 MED ORDER — HYDROCODONE-HOMATROPINE 5-1.5 MG/5ML PO SYRP: 5.0000 mL | ORAL_SOLUTION | Freq: Three times a day (TID) | ORAL | Status: DC | PRN

## 2016-01-12 MED ORDER — PREDNISONE 10 MG (21) PO TBPK: ORAL_TABLET | ORAL | Status: DC

## 2016-01-12 NOTE — Patient Instructions (Signed)
Upper Respiratory Infection, Adult Most upper respiratory infections (URIs) are a viral infection of the air passages leading to the lungs. A URI affects the nose, throat, and upper air passages. The most common type of URI is nasopharyngitis and is typically referred to as "the common cold." URIs run their course and usually go away on their own. Most of the time, a URI does not require medical attention, but sometimes a bacterial infection in the upper airways can follow a viral infection. This is called a secondary infection. Sinus and middle ear infections are common types of secondary upper respiratory infections. Bacterial pneumonia can also complicate a URI. A URI can worsen asthma and chronic obstructive pulmonary disease (COPD). Sometimes, these complications can require emergency medical care and may be life threatening.  CAUSES Almost all URIs are caused by viruses. A virus is a type of germ and can spread from one person to another.  RISKS FACTORS You may be at risk for a URI if:   You smoke.   You have chronic heart or lung disease.  You have a weakened defense (immune) system.   You are very young or very old.   You have nasal allergies or asthma.  You work in crowded or poorly ventilated areas.  You work in health care facilities or schools. SIGNS AND SYMPTOMS  Symptoms typically develop 2-3 days after you come in contact with a cold virus. Most viral URIs last 7-10 days. However, viral URIs from the influenza virus (flu virus) can last 14-18 days and are typically more severe. Symptoms may include:   Runny or stuffy (congested) nose.   Sneezing.   Cough.   Sore throat.   Headache.   Fatigue.   Fever.   Loss of appetite.   Pain in your forehead, behind your eyes, and over your cheekbones (sinus pain).  Muscle aches.  DIAGNOSIS  Your health care provider may diagnose a URI by:  Physical exam.  Tests to check that your symptoms are not due to  another condition such as:  Strep throat.  Sinusitis.  Pneumonia.  Asthma. TREATMENT  A URI goes away on its own with time. It cannot be cured with medicines, but medicines may be prescribed or recommended to relieve symptoms. Medicines may help:  Reduce your fever.  Reduce your cough.  Relieve nasal congestion. HOME CARE INSTRUCTIONS   Take medicines only as directed by your health care provider.   Gargle warm saltwater or take cough drops to comfort your throat as directed by your health care provider.  Use a warm mist humidifier or inhale steam from a shower to increase air moisture. This may make it easier to breathe.  Drink enough fluid to keep your urine clear or pale yellow.   Eat soups and other clear broths and maintain good nutrition.   Rest as needed.   Return to work when your temperature has returned to normal or as your health care provider advises. You may need to stay home longer to avoid infecting others. You can also use a face mask and careful hand washing to prevent spread of the virus.  Increase the usage of your inhaler if you have asthma.   Do not use any tobacco products, including cigarettes, chewing tobacco, or electronic cigarettes. If you need help quitting, ask your health care provider. PREVENTION  The best way to protect yourself from getting a cold is to practice good hygiene.   Avoid oral or hand contact with people with cold   symptoms.   Wash your hands often if contact occurs.  There is no clear evidence that vitamin C, vitamin E, echinacea, or exercise reduces the chance of developing a cold. However, it is always recommended to get plenty of rest, exercise, and practice good nutrition.  SEEK MEDICAL CARE IF:   You are getting worse rather than better.   Your symptoms are not controlled by medicine.   You have chills.  You have worsening shortness of breath.  You have brown or red mucus.  You have yellow or brown nasal  discharge.  You have pain in your face, especially when you bend forward.  You have a fever.  You have swollen neck glands.  You have pain while swallowing.  You have white areas in the back of your throat. SEEK IMMEDIATE MEDICAL CARE IF:   You have severe or persistent:  Headache.  Ear pain.  Sinus pain.  Chest pain.  You have chronic lung disease and any of the following:  Wheezing.  Prolonged cough.  Coughing up blood.  A change in your usual mucus.  You have a stiff neck.  You have changes in your:  Vision.  Hearing.  Thinking.  Mood. MAKE SURE YOU:   Understand these instructions.  Will watch your condition.  Will get help right away if you are not doing well or get worse.   This information is not intended to replace advice given to you by your health care provider. Make sure you discuss any questions you have with your health care provider.   Document Released: 03/15/2001 Document Revised: 02/03/2015 Document Reviewed: 12/25/2013 Elsevier Interactive Patient Education 2016 Elsevier Inc.  

## 2016-01-12 NOTE — Progress Notes (Signed)
Patient: Lisa Stein Female    DOB: January 13, 1961   55 y.o.   MRN: [id number] Visit Date: 01/12/2016  Today's Provider: Mar Daring, PA-C   Chief Complaint  Patient presents with  . Sore Throat   Subjective:    URI  This is a new problem. The current episode started in the past 7 days. The problem has been gradually worsening. There has been no fever. Associated symptoms include congestion, coughing, headaches (this morning), rhinorrhea, sneezing and wheezing (a little). Pertinent negatives include no abdominal pain, chest pain, ear pain, nausea, plugged ear sensation, sinus pain or vomiting. Treatments tried: She takes Sinus medication all the time and Benadryl. The treatment provided no relief.       Allergies  Allergen Reactions  . Benzoin     Blisters.  . Biaxin [Clarithromycin] Diarrhea    GI upset  . Sulfa Antibiotics Itching  . Tegretol  [Carbamazepine] Itching  . Betadine  [Povidone Iodine] Rash   Previous Medications   CELECOXIB (CELEBREX) 200 MG CAPSULE    Take 1 capsule by mouth  daily   CHOLECALCIFEROL (VITAMIN D3) 2000 UNITS TABS    Take by mouth.   DIAZEPAM (VALIUM) 5 MG TABLET    Take by mouth.   DIPHENHYDRAMINE HCL, SLEEP, 50 MG CAPS    Take by mouth.   FLUTICASONE (FLONASE) 50 MCG/ACT NASAL SPRAY    Place into the nose.   HYDROCODONE-ACETAMINOPHEN (NORCO) 5-325 MG TABLET    Take 1-2 tablets by mouth every 4 (four) hours as needed for moderate pain.   OMEPRAZOLE (PRILOSEC) 20 MG CAPSULE    Take 1 capsule (20 mg total) by mouth 2 (two) times daily before a meal.   SERTRALINE (ZOLOFT) 100 MG TABLET    Take 1 tablet (100 mg total) by mouth daily.    Review of Systems  Constitutional: Positive for fatigue. Negative for fever and chills.  HENT: Positive for congestion, postnasal drip, rhinorrhea, sneezing and voice change. Negative for ear pain.   Respiratory: Positive for cough and wheezing (a little). Negative for chest tightness and shortness  of breath.   Cardiovascular: Negative for chest pain, palpitations and leg swelling.  Gastrointestinal: Negative for nausea, vomiting and abdominal pain.  Musculoskeletal: Positive for back pain (upper back on left side due to coughing).  Neurological: Positive for headaches (this morning). Negative for dizziness.    Social History  Substance Use Topics  . Smoking status: Former Smoker -- 5 years  . Smokeless tobacco: Never Used     Comment: Quit smoking in 1980's  . Alcohol Use: 0.6 oz/week    0 Standard drinks or equivalent, 1 Glasses of wine per week   Objective:   BP 138/80 mmHg  Pulse 72  Temp(Src) 98.2 F (36.8 C) (Oral)  Resp 16  Wt 274 lb 3.2 oz (124.376 kg)  SpO2 97%  Physical Exam  Constitutional: She appears well-developed and well-nourished. No distress.  HENT:  Head: Normocephalic and atraumatic.  Right Ear: Hearing, external ear and ear canal normal. Tympanic membrane is not erythematous and not bulging. No middle ear effusion.  Left Ear: Hearing, external ear and ear canal normal. Tympanic membrane is bulging. Tympanic membrane is not erythematous. A middle ear effusion is present.  Nose: Mucosal edema and rhinorrhea present. Right sinus exhibits no maxillary sinus tenderness and no frontal sinus tenderness. Left sinus exhibits no maxillary sinus tenderness and no frontal sinus tenderness.  Mouth/Throat: Uvula is midline, oropharynx  is clear and moist and mucous membranes are normal. No oropharyngeal exudate, posterior oropharyngeal edema or posterior oropharyngeal erythema.  Eyes: Conjunctivae are normal. Pupils are equal, round, and reactive to light. Right eye exhibits no discharge. Left eye exhibits no discharge. No scleral icterus.  Neck: Normal range of motion. Neck supple. No tracheal deviation present. No thyromegaly present.  Cardiovascular: Normal rate, regular rhythm and normal heart sounds.  Exam reveals no gallop and no friction rub.   No murmur  heard. Pulmonary/Chest: Effort normal and breath sounds normal. No stridor. No respiratory distress. She has no decreased breath sounds. She has no wheezes (wheezes not heard but tight breath sounds). She has no rales.  Lymphadenopathy:    She has no cervical adenopathy.  Skin: Skin is warm and dry. She is not diaphoretic.  Vitals reviewed.       Assessment & Plan:     1. Upper respiratory infection Worsening symptoms. Will treat with augmentin as below. Discussed using Mucinex during the day for congestion. Prednisone given for chest tightness and inflammation. Hycodan for nighttime cough. Advised of drowsiness precautions with Hycodan. She needs to stay well hydrated and get plenty of rest. She is to call if symptoms fail to improve or worsen. - amoxicillin-clavulanate (AUGMENTIN) 875-125 MG tablet; Take 1 tablet by mouth 2 (two) times daily.  Dispense: 20 tablet; Refill: 0 - predniSONE (STERAPRED UNI-PAK 21 TAB) 10 MG (21) TBPK tablet; Take as directed on package instructions.  Dispense: 21 tablet; Refill: 0  2. Cough See above medical treatment plan. - HYDROcodone-homatropine (HYCODAN) 5-1.5 MG/5ML syrup; Take 5 mLs by mouth every 8 (eight) hours as needed.  Dispense: 180 mL; Refill: 0 - predniSONE (STERAPRED UNI-PAK 21 TAB) 10 MG (21) TBPK tablet; Take as directed on package instructions.  Dispense: 21 tablet; Refill: 0       Mar Daring, PA-C  [facility]

## 2016-03-01 ENCOUNTER — Other Ambulatory Visit: Payer: Self-pay | Admitting: Physical Medicine and Rehabilitation

## 2016-03-01 DIAGNOSIS — M545 Low back pain: Secondary | ICD-10-CM

## 2016-03-02 ENCOUNTER — Ambulatory Visit: Payer: 59 | Admitting: Family Medicine

## 2016-03-18 ENCOUNTER — Ambulatory Visit
Admission: RE | Admit: 2016-03-18 | Discharge: 2016-03-18 | Disposition: A | Discharge status: Home / Self Care | Payer: 59 | Source: Ambulatory Visit | Attending: Physical Medicine and Rehabilitation | Admitting: Physical Medicine and Rehabilitation

## 2016-03-18 DIAGNOSIS — M5416 Radiculopathy, lumbar region: Secondary | ICD-10-CM | POA: Insufficient documentation

## 2016-03-18 DIAGNOSIS — M5136 Other intervertebral disc degeneration, lumbar region: Secondary | ICD-10-CM | POA: Insufficient documentation

## 2016-03-18 DIAGNOSIS — M47896 Other spondylosis, lumbar region: Secondary | ICD-10-CM | POA: Diagnosis not present

## 2016-03-18 DIAGNOSIS — M545 Low back pain: Secondary | ICD-10-CM

## 2016-04-07 ENCOUNTER — Telehealth: Payer: Self-pay | Admitting: Emergency Medicine

## 2016-04-07 NOTE — Telephone Encounter (Signed)
[facility] Called stating that pt has been prescribed cyclobenzaprine by Dr. Sharlet Salina and pharmacist wants to make you aware of the interaction causing serotonin syndrome and QT prolongation with cyclobenzaprine and Sertraline together. Do you want her to not take the sertraline if she takes the cyclobenzaprine or to get Dr. Sharlet Salina to change her medication? Please advise.

## 2016-04-07 NOTE — Telephone Encounter (Signed)
I'm not sure she can do without her sertraline. I would  recommend talking to Dr. Sharlet Salina  about changing flexeril.

## 2016-04-08 NOTE — Telephone Encounter (Signed)
Patient advised as below. Patient verbalizes understanding and is in agreement with plan pt will call Dr. Sharlet Salina office.

## 2016-04-11 ENCOUNTER — Other Ambulatory Visit: Payer: Self-pay | Admitting: Emergency Medicine

## 2016-04-11 DIAGNOSIS — F411 Generalized anxiety disorder: Secondary | ICD-10-CM

## 2016-04-11 MED ORDER — SERTRALINE HCL 100 MG PO TABS: 100.0000 mg | ORAL_TABLET | Freq: Every day | ORAL | Status: DC

## 2016-04-12 ENCOUNTER — Encounter: Payer: Self-pay | Admitting: Family Medicine

## 2016-04-26 ENCOUNTER — Ambulatory Visit: Payer: 59 | Admitting: Family Medicine

## 2016-05-03 ENCOUNTER — Encounter: Payer: Self-pay | Admitting: Family Medicine

## 2016-05-03 ENCOUNTER — Ambulatory Visit (INDEPENDENT_AMBULATORY_CARE_PROVIDER_SITE_OTHER): Payer: 59 | Admitting: Family Medicine

## 2016-05-03 VITALS — BP 118/80 | HR 78 | Temp 98.0°F | Resp 16 | Wt 284.0 lb

## 2016-05-03 DIAGNOSIS — N951 Menopausal and female climacteric states: Secondary | ICD-10-CM

## 2016-05-03 MED ORDER — ESTRADIOL 1 MG PO TABS: 1.0000 mg | ORAL_TABLET | Freq: Every day | ORAL | 6 refills | Status: DC

## 2016-05-03 NOTE — Progress Notes (Signed)
Subjective:  HPI Pt is here for a 6 month follow up of chronic problems. She reports that she is feeling well emotionally. She is still having hot flashes and night sweats, she would like to discuss trying HRT. She has not tried anything in the past but has refused HRT in the past, but she is ready to try something.   Prior to Admission medications   Medication Sig Start Date End Date Taking? Authorizing Provider  celecoxib (CELEBREX) 200 MG capsule Take 1 capsule by mouth  daily 09/09/15  Yes Niaya Hickok Hulen Shouts., MD  Cholecalciferol (VITAMIN D3) 2000 UNITS TABS Take by mouth. 04/22/13  Yes Historical Provider, MD  diazepam (VALIUM) 5 MG tablet Take by mouth. 04/22/13  Yes Historical Provider, MD  DiphenhydrAMINE HCl, Sleep, 50 MG CAPS Take by mouth. 05/31/11  Yes Historical Provider, MD  fluticasone (FLONASE) 50 MCG/ACT nasal spray Place into the nose. 12/05/13  Yes Historical Provider, MD  omeprazole (PRILOSEC) 20 MG capsule Take 1 capsule (20 mg total) by mouth 2 (two) times daily before a meal. 10/01/15  Yes Maple Hudson., MD  sertraline (ZOLOFT) 100 MG tablet Take 1 tablet (100 mg total) by mouth daily. 04/11/16  Yes Slyvester Latona Hulen Shouts., MD  HYDROcodone-acetaminophen [facility]) 5-325 MG tablet Take 1-2 tablets by mouth every 4 (four) hours as needed for moderate pain. Patient not taking: Reported on 01/12/2016 11/30/15   Evangeline Dakin, PA-C    Patient Active Problem List   Diagnosis Date Noted  . Clinical depression 01/12/2016  . Difficult or painful urination 03/16/2015  . Fatigue 03/16/2015  . Anxiety, generalized 03/16/2015  . Acid reflux 03/16/2015  . Cardiac murmur 03/16/2015  . H/O major abdominal surgery 03/16/2015  . Benign neoplasm of colon 03/16/2015  . Calculus of kidney 03/16/2015  . Acquired lymphedema 03/16/2015  . Affective disorder, major (HCC) 03/16/2015  . Arthritis, degenerative 03/16/2015  . Adiposity 03/16/2015  . Raynaud's syndrome 03/16/2015  .  Muscle rigidity 03/16/2015  . Fothergill's neuralgia 03/16/2015  . Avitaminosis D 03/16/2015  . DDD (degenerative disc disease), lumbar 11/20/2014  . Neuritis or radiculitis due to rupture of lumbar intervertebral disc 11/20/2014  . Intussusception, ileocecal (HCC) 08/19/2014  . Lower abdominal pain 08/05/2014  . Diarrhea 08/05/2014  . Abnormal abdominal CT scan 08/05/2014    Past Medical History:  Diagnosis Date  . Anxiety   . Depression   . GERD (gastroesophageal reflux disease)   . Lymphedema   . Neuralgia   . Osteoarthritis   . Raynaud disease     Social History   Social History  . Marital status: Married    Spouse name: N/A  . Number of children: N/A  . Years of education: N/A   Occupational History  . Not on file.   Social History Main Topics  . Smoking status: Former Smoker    Years: 5.00  . Smokeless tobacco: Never Used     Comment: Quit smoking in 1980's  . Alcohol use 0.6 oz/week    1 Glasses of wine per week  . Drug use: No  . Sexual activity: Not on file   Other Topics Concern  . Not on file   Social History Narrative  . No narrative on file    Allergies  Allergen Reactions  . Benzoin     Blisters.  . Biaxin [Clarithromycin] Diarrhea    GI upset  . Sulfa Antibiotics Itching  . Tegretol  [Carbamazepine] Itching  . Betadine  [  Povidone Iodine] Rash    Review of Systems  Constitutional: Positive for diaphoresis.  HENT: Negative.   Eyes: Negative.   Respiratory: Negative.   Cardiovascular: Negative.   Gastrointestinal: Negative.   Genitourinary: Negative.   Musculoskeletal: Negative.   Skin: Negative.   Neurological: Negative.   Endo/Heme/Allergies: Negative.   Psychiatric/Behavioral: Negative.     Immunization History  Administered Date(s) Administered  . Tdap 01/26/2006   Objective:  BP 118/80 (BP Location: Right Arm, Patient Position: Sitting, Cuff Size: Large)   Pulse 78   Temp 98 F (36.7 C) (Oral)   Resp 16   Wt 284 lb  (128.8 kg)   BMI 37.47 kg/m   Physical Exam  Constitutional: She is oriented to person, place, and time and well-developed, well-nourished, and in no distress.  Eyes: Conjunctivae and EOM are normal. Pupils are equal, round, and reactive to light.  Neck: Normal range of motion. Neck supple.  Cardiovascular: Normal rate, regular rhythm, normal heart sounds and intact distal pulses.   Pulmonary/Chest: Effort normal and breath sounds normal.  Musculoskeletal: Normal range of motion.  Neurological: She is alert and oriented to person, place, and time. She has normal reflexes. Gait normal. GCS score is 15.  Skin: Skin is warm and dry.  Psychiatric: Mood, memory, affect and judgment normal.    Lab Results  Component Value Date   WBC 5.2 07/15/2015   HGB 14.3 08/04/2014   HCT 44.0 07/15/2015   PLT 276 07/15/2015   GLUCOSE 95 07/15/2015   TSH 1.840 07/15/2015    CMP     Component Value Date/Time   NA 143 07/15/2015 0840   NA 138 04/17/2013 1025   K 4.5 07/15/2015 0840   K 3.4 (L) 04/17/2013 1025   CL 101 07/15/2015 0840   CL 105 04/17/2013 1025   CO2 24 07/15/2015 0840   CO2 28 04/17/2013 1025   GLUCOSE 95 07/15/2015 0840   GLUCOSE 141 (H) 04/17/2013 1025   BUN 20 07/15/2015 0840   BUN 19 (H) 04/17/2013 1025   CREATININE 1.09 (H) 07/15/2015 0840   CREATININE 1.75 (H) 04/17/2013 1025   CALCIUM 10.1 07/15/2015 0840   CALCIUM 8.8 04/17/2013 1025   PROT 6.9 07/15/2015 0840   PROT 7.0 04/17/2013 1025   ALBUMIN 4.6 07/15/2015 0840   ALBUMIN 3.0 (L) 04/17/2013 1025   AST 22 07/15/2015 0840   AST 27 04/17/2013 1025   ALT 18 07/15/2015 0840   ALT 30 04/17/2013 1025   ALKPHOS 81 07/15/2015 0840   ALKPHOS 148 (H) 04/17/2013 1025   BILITOT 0.3 07/15/2015 0840   BILITOT 0.6 04/17/2013 1025   GFRNONAA 57 (L) 07/15/2015 0840   GFRNONAA 32 (L) 04/17/2013 1025   GFRAA 66 07/15/2015 0840   GFRAA 38 (L) 04/17/2013 1025    Assessment and Plan :  1. Menopausal symptom Start  Estradiol for hot flashes and night sweats. Pt instructed on risk of medication. Follow up 2-3 months.  2. Obesity 3. Osteoarthritis  Patient was seen and examined by Dr. Miguel Aschoff, and noted scribed by Webb Laws, CMA I have done the exam and reviewed the above chart and it is accurate to the best of my knowledge.  Miguel Aschoff MD [facility] 05/03/2016 11:47 AM

## 2016-05-21 ENCOUNTER — Other Ambulatory Visit: Payer: Self-pay | Admitting: Family Medicine

## 2016-06-24 ENCOUNTER — Other Ambulatory Visit: Payer: Self-pay

## 2016-06-24 DIAGNOSIS — N951 Menopausal and female climacteric states: Secondary | ICD-10-CM

## 2016-06-24 MED ORDER — ESTRADIOL 1 MG PO TABS: 1.0000 mg | ORAL_TABLET | Freq: Every day | ORAL | 11 refills | Status: DC

## 2016-06-24 NOTE — Telephone Encounter (Signed)
Mail order pharmacy requesting 90 day supply. Thanks!

## 2016-07-12 ENCOUNTER — Encounter: Payer: Self-pay | Admitting: Family Medicine

## 2016-07-21 ENCOUNTER — Ambulatory Visit (INDEPENDENT_AMBULATORY_CARE_PROVIDER_SITE_OTHER): Payer: 59 | Admitting: Family Medicine

## 2016-07-21 ENCOUNTER — Encounter: Payer: Self-pay | Admitting: Family Medicine

## 2016-07-21 VITALS — BP 122/86 | HR 80 | Temp 98.1°F | Resp 16 | Ht 72.0 in | Wt 279.0 lb

## 2016-07-21 DIAGNOSIS — Z23 Encounter for immunization: Secondary | ICD-10-CM

## 2016-07-21 DIAGNOSIS — E6609 Other obesity due to excess calories: Secondary | ICD-10-CM

## 2016-07-21 DIAGNOSIS — Z780 Asymptomatic menopausal state: Secondary | ICD-10-CM

## 2016-07-21 DIAGNOSIS — F329 Major depressive disorder, single episode, unspecified: Secondary | ICD-10-CM

## 2016-07-21 DIAGNOSIS — Z6837 Body mass index (BMI) 37.0-37.9, adult: Secondary | ICD-10-CM

## 2016-07-21 DIAGNOSIS — F32A Depression, unspecified: Secondary | ICD-10-CM

## 2016-07-21 HISTORY — DX: Asymptomatic menopausal state: Z78.0

## 2016-07-21 MED ORDER — ARIPIPRAZOLE 5 MG PO TABS: 5.0000 mg | ORAL_TABLET | Freq: Every day | ORAL | 5 refills | Status: DC

## 2016-07-21 NOTE — Progress Notes (Signed)
Patient: Lisa Stein Female    DOB: 1961/05/27   55 y.o.   MRN: [id number] Visit Date: 07/21/2016  Today's Provider: [name], MD   Chief Complaint  Patient presents with  . Menopause  . Obesity   Subjective:    HPI     Follow up for Menopause  The patient was last seen for this 2 months ago. Changes made at last visit include started estradiol.  She reports excellent compliance with treatment. She feels that condition is Improved. She is not having side effects.  ------------------------------------------------------------------------------------  Obesity Pt's BMI is 37.84 today. Needs insurance appeal form filled out for employer. Pt is not exercising. States she has a generally unhealthy diet, and is unsure of her daily caloric intake. Stress Patient is overwhelmed with a combination of job and family stresses. She is anxious and admits that her depression is worse. She is irritable but her husband says she is not unreasonable. She has never lasting out at him. He is more than willing to try medications to help control this. She is on maximum dose Zoloft.  Allergies  Allergen Reactions  . Benzoin     Blisters.  . Biaxin [Clarithromycin] Diarrhea    GI upset  . Sulfa Antibiotics Itching  . Tegretol  [Carbamazepine] Itching  . Betadine  [Povidone Iodine] Rash     Current Outpatient Prescriptions:  .  amoxicillin (AMOXIL) 875 MG tablet, , Disp: , Rfl:  .  celecoxib (CELEBREX) 200 MG capsule, Take 1 capsule by mouth  daily, Disp: 90 capsule, Rfl: 3 .  Cholecalciferol (VITAMIN D3) 2000 UNITS TABS, Take by mouth., Disp: , Rfl:  .  diazepam (VALIUM) 5 MG tablet, Take by mouth., Disp: , Rfl:  .  DiphenhydrAMINE HCl, Sleep, 50 MG CAPS, Take by mouth., Disp: , Rfl:  .  estradiol (ESTRACE) 1 MG tablet, Take 1 tablet (1 mg total) by mouth daily., Disp: 30 tablet, Rfl: 11 .  fluticasone (FLONASE) 50 MCG/ACT nasal spray, Place into the nose., Disp: ,  Rfl:  .  omeprazole (PRILOSEC) 20 MG capsule, Take 1 capsule by mouth two times daily with food, Disp: 180 capsule, Rfl: 3 .  sertraline (ZOLOFT) 100 MG tablet, Take 1 tablet (100 mg total) by mouth daily., Disp: 30 tablet, Rfl: 0 .  HYDROcodone-acetaminophen (NORCO) 5-325 MG tablet, Take 1-2 tablets by mouth every 4 (four) hours as needed for moderate pain. (Patient not taking: Reported on 07/21/2016), Disp: 15 tablet, Rfl: 0  Review of Systems  Constitutional: Positive for fatigue. Negative for activity change, appetite change, chills, diaphoresis, fever and unexpected weight change.  Respiratory: Negative for cough and shortness of breath.   Cardiovascular: Positive for leg swelling. Negative for chest pain and palpitations.  Musculoskeletal: Positive for back pain (is having surgery on 08/02/2016).    Social History  Substance Use Topics  . Smoking status: Former Smoker    Years: 5.00  . Smokeless tobacco: Never Used     Comment: Quit smoking in 1980's  . Alcohol use 0.6 oz/week    1 Glasses of wine per week     Comment: occasional   Objective:   BP 122/86 (BP Location: Left Arm, Patient Position: Sitting, Cuff Size: Large)   Pulse 80   Temp 98.1 F (36.7 C) (Oral)   Resp 16   Ht 6' (1.829 m)   Wt 279 lb (126.6 kg)   BMI 37.84 kg/m   Physical Exam  Constitutional: She  appears well-developed and well-nourished.  HENT:  Head: Normocephalic and atraumatic.  Cardiovascular: Normal rate, regular rhythm and normal heart sounds.   Pulmonary/Chest: Effort normal and breath sounds normal. No respiratory distress.  Psychiatric: She has a normal mood and affect. Her behavior is normal.        Assessment & Plan:      1. Major depressive episode, mild This appears to be worse recently. She is maximum dose SSRI. We'll add mood stabilizer. Will need to be aware of the side effect of weight gain with this in this obese patient. Discussed this with her and I hope that this dosing  will not affect the weight. Recheck in 2 weeks to see how she's doing with a mood stabilizer. More than 50% of 25 minute visit is spent in counseling the patient  - ARIPiprazole (ABILIFY) 5 MG tablet; Take 1 tablet (5 mg total) by mouth daily.  Dispense: 30 tablet; Refill: 5  Depression screen PHQ 2/9 07/21/2016  Decreased Interest 2  Down, Depressed, Hopeless 1  PHQ - 2 Score 3  Altered sleeping 0  Tired, decreased energy 2  Change in appetite 0  Feeling bad or failure about yourself  1  Trouble concentrating 2  Moving slowly or fidgety/restless 1  Suicidal thoughts 0  PHQ-9 Score 9  Difficult doing work/chores Very difficult    2. Menopause Stable. Continue medications.   3. Class 2 obesity due to excess calories without serious comorbidity with body mass index (BMI) of 37.0 to 37.9 in adult Dietary changes discussed at length with lower carbohydrate intake and smaller portions. Waist is now 52 inches, goal of 35 inches discussed with patient. Hopefully she will be able to exercise after surgery.   4. Flu vaccine need Needs flu vaccine, but pt on abx currently. Will administer at FU. 5. Chronic low back pain 6. Osteoarthritis Patient seen and examined by Miguel Aschoff, MD, and note scribed by Renaldo Fiddler, CMA.  I have done the exam and reviewed the above chart and it is accurate to the best of my knowledge.  Shamel Germond Cranford Mon, MD  [facility]

## 2016-07-24 ENCOUNTER — Other Ambulatory Visit: Payer: Self-pay | Admitting: Family Medicine

## 2016-08-01 ENCOUNTER — Ambulatory Visit (INDEPENDENT_AMBULATORY_CARE_PROVIDER_SITE_OTHER): Payer: 59 | Admitting: Family Medicine

## 2016-08-01 VITALS — BP 132/76 | HR 68 | Temp 98.2°F | Resp 14 | Wt 278.0 lb

## 2016-08-01 DIAGNOSIS — E6609 Other obesity due to excess calories: Secondary | ICD-10-CM

## 2016-08-01 DIAGNOSIS — M5136 Other intervertebral disc degeneration, lumbar region: Secondary | ICD-10-CM | POA: Diagnosis not present

## 2016-08-01 DIAGNOSIS — F32A Depression, unspecified: Secondary | ICD-10-CM

## 2016-08-01 DIAGNOSIS — Z23 Encounter for immunization: Secondary | ICD-10-CM | POA: Diagnosis not present

## 2016-08-01 DIAGNOSIS — M5116 Intervertebral disc disorders with radiculopathy, lumbar region: Secondary | ICD-10-CM

## 2016-08-01 DIAGNOSIS — M51369 Other intervertebral disc degeneration, lumbar region without mention of lumbar back pain or lower extremity pain: Secondary | ICD-10-CM

## 2016-08-01 DIAGNOSIS — Z6837 Body mass index (BMI) 37.0-37.9, adult: Secondary | ICD-10-CM | POA: Diagnosis not present

## 2016-08-01 DIAGNOSIS — F329 Major depressive disorder, single episode, unspecified: Secondary | ICD-10-CM | POA: Diagnosis not present

## 2016-08-01 NOTE — Progress Notes (Signed)
Lisa Stein  MRN: [id number] DOB: 07-04-1961  Subjective:  HPI  Patient is here for 2 week follow up after adding Abilify. She feels like she has improved since starting this medication. Patient Active Problem List   Diagnosis Date Noted  . Menopause 07/21/2016  . Clinical depression 01/12/2016  . Difficult or painful urination 03/16/2015  . Fatigue 03/16/2015  . Anxiety, generalized 03/16/2015  . Acid reflux 03/16/2015  . Cardiac murmur 03/16/2015  . H/O major abdominal surgery 03/16/2015  . Benign neoplasm of colon 03/16/2015  . Calculus of kidney 03/16/2015  . Acquired lymphedema 03/16/2015  . Affective disorder, major 03/16/2015  . Arthritis, degenerative 03/16/2015  . Adiposity 03/16/2015  . Raynaud's syndrome 03/16/2015  . Muscle rigidity 03/16/2015  . Fothergill's neuralgia 03/16/2015  . Avitaminosis D 03/16/2015  . DDD (degenerative disc disease), lumbar 11/20/2014  . Neuritis or radiculitis due to rupture of lumbar intervertebral disc 11/20/2014  . Intussusception, ileocecal ([place]) 08/19/2014  . Lower abdominal pain 08/05/2014  . Diarrhea 08/05/2014  . Abnormal abdominal CT scan 08/05/2014    Past Medical History:  Diagnosis Date  . Anxiety   . Depression   . GERD (gastroesophageal reflux disease)   . Lymphedema   . Neuralgia   . Osteoarthritis   . Raynaud disease     Social History   Social History  . Marital status: Married    Spouse name: N/A  . Number of children: N/A  . Years of education: N/A   Occupational History  . Not on file.   Social History Main Topics  . Smoking status: Former Smoker    Years: 5.00  . Smokeless tobacco: Never Used     Comment: Quit smoking in 1980's  . Alcohol use 0.6 oz/week    1 Glasses of wine per week     Comment: occasional  . Drug use: No  . Sexual activity: Not on file   Other Topics Concern  . Not on file   Social History Narrative  . No narrative on file    Outpatient Encounter  Prescriptions as of 08/01/2016  Medication Sig Note  . ARIPiprazole (ABILIFY) 5 MG tablet Take 1 tablet (5 mg total) by mouth daily.   . celecoxib (CELEBREX) 200 MG capsule TAKE 1 CAPSULE BY MOUTH  DAILY   . Cholecalciferol (VITAMIN D3) 2000 UNITS TABS Take by mouth. 03/16/2015: Received from: [facility]  . diazepam (VALIUM) 5 MG tablet Take by mouth. 03/16/2015: Medication taken as needed.  Received from: [facility]  . DiphenhydrAMINE HCl, Sleep, 50 MG CAPS Take by mouth. 03/16/2015: Received from: [facility]  . estradiol (ESTRACE) 1 MG tablet Take 1 tablet (1 mg total) by mouth daily.   . fluticasone (FLONASE) 50 MCG/ACT nasal spray Place into the nose. 03/16/2015: Received from: [facility]  . HYDROcodone-acetaminophen (NORCO) 5-325 MG tablet Take 1-2 tablets by mouth every 4 (four) hours as needed for moderate pain.   Marland Kitchen omeprazole (PRILOSEC) 20 MG capsule Take 1 capsule by mouth two times daily with food   . sertraline (ZOLOFT) 100 MG tablet Take 1 tablet (100 mg total) by mouth daily. (Patient taking differently: Take 200 mg by mouth daily. )   . [DISCONTINUED] amoxicillin (AMOXIL) 875 MG tablet  07/21/2016: Received from: External Pharmacy   No facility-administered encounter medications on file as of 08/01/2016.     Allergies  Allergen Reactions  . Benzoin     Blisters.  . Biaxin [Clarithromycin] Diarrhea  GI upset  . Sulfa Antibiotics Itching  . Tegretol  [Carbamazepine] Itching  . Betadine  [Povidone Iodine] Rash    Review of Systems  Constitutional: Negative.   Respiratory: Negative.   Cardiovascular: Negative.   Musculoskeletal: Positive for back pain and joint pain. Negative for myalgias and neck pain.  Psychiatric/Behavioral: Positive for depression. The patient is nervous/anxious.        Better since adding Abilify   Objective:  BP 132/76   Pulse 68   Temp 98.2 F (36.8 C)   Resp 14   Wt  278 lb (126.1 kg)   BMI 37.70 kg/m   Physical Exam  Constitutional: She is oriented to person, place, and time and well-developed, well-nourished, and in no distress.  HENT:  Head: Normocephalic and atraumatic.  Right Ear: External ear normal.  Left Ear: External ear normal.  Nose: Nose normal.  Eyes: Conjunctivae are normal. Pupils are equal, round, and reactive to light.  Neck: Normal range of motion. Neck supple.  Cardiovascular: Normal rate, regular rhythm, normal heart sounds and intact distal pulses.   No murmur heard. Pulmonary/Chest: Effort normal and breath sounds normal.  Abdominal: Soft.  Neurological: She is alert and oriented to person, place, and time.  Skin: Skin is warm and dry.  Psychiatric: Mood, memory, affect and judgment normal.    Assessment and Plan :  1. Depression, unspecified depression type Better on Abilify. Discussed with patient that it would be a good idea to stay on this medication for now. Follow. I think stay on at least till next spring. If her back surgeries extremely successful may be able to come off low but earlier than that. 2. Class 2 obesity due to excess calories without serious comorbidity with body mass index (BMI) of 37.0 to 37.9 in adult 3. Severe back pain For surgery tomorrow. I have done the exam and reviewed the chart and it is accurate to the best of my knowledge. Miguel Aschoff M.D. [facility]  3. DDD (degenerative disc disease), lumbar Having back surgery tomorrow 08/02/16. 4. Neuritis or radiculitis due to rupture of lumbar intervertebral disc  HPI, Exam and A&P transcribed under direction and in the presence of Miguel Aschoff, MD.

## 2016-08-02 HISTORY — PX: SPINE SURGERY: SHX786

## 2016-08-04 ENCOUNTER — Ambulatory Visit: Payer: 59 | Admitting: Family Medicine

## 2016-08-29 ENCOUNTER — Telehealth: Payer: Self-pay | Admitting: General Surgery

## 2016-08-29 NOTE — Telephone Encounter (Signed)
Please arrange office visit for assessment.

## 2016-08-29 NOTE — Telephone Encounter (Signed)
PATIENT CALLED STATING SHE THINKS SHE HAS A HERNIA AT ONE OF THE SITES WHERE  SHE HAD A LAPAROSCOPICALLY ASSISTED RIGHT [place] WITH ILEOCOSTOMY DONE 08-08-2014.CAN WE MAKE HER AN APPT OR DOES SHE NEED TO SEE HER PRIMARY CARE DOCTOR? PATIENT WILL CALL BACK ON Wednesday IF SHE HAS NOT HEARD ANYTHING.

## 2016-09-05 ENCOUNTER — Ambulatory Visit (INDEPENDENT_AMBULATORY_CARE_PROVIDER_SITE_OTHER): Payer: 59 | Admitting: General Surgery

## 2016-09-05 ENCOUNTER — Encounter: Payer: Self-pay | Admitting: General Surgery

## 2016-09-05 VITALS — BP 132/77 | HR 82 | Resp 14 | Ht 72.0 in | Wt 276.0 lb

## 2016-09-05 DIAGNOSIS — R229 Localized swelling, mass and lump, unspecified: Secondary | ICD-10-CM

## 2016-09-05 NOTE — Progress Notes (Signed)
Patient ID: Lisa Stein, female   DOB: April 19, 1961, 55 y.o.   MRN: [id number]  Chief Complaint  Patient presents with  . Other    hernia    HPI Lisa Stein is a 55 y.o. female here for assessment of a possible hernia in the same area as her colon surgery done in 2015. She first noticed this on 08/24/16. She had not been doing anything strenuous. She denies any pain or GI problems.   HPI  Past Medical History:  Diagnosis Date  . Anxiety   . Depression   . GERD (gastroesophageal reflux disease)   . Lymphedema   . Neuralgia   . Osteoarthritis   . Raynaud disease     Past Surgical History:  Procedure Laterality Date  . ABDOMINAL HYSTERECTOMY  2003  . ANKLE FRACTURE SURGERY  2010  . Bunionectomy Right   . 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  . COLONOSCOPY  2010   Dr. Vira Agar  . COLONOSCOPY  08-07-14   Dr Bary Castilla  . HEMICOLECTOMY  08/08/14  . REPLACEMENT TOTAL KNEE  2009  . sinus problem  2004   Sinuses opened up  . SPINE SURGERY  08/02/2016   spinal fusion - [facility]  . TOOTH EXTRACTION Right 2016  . TOTAL SHOULDER REPLACEMENT  2008,2009    Family History  Problem Relation Age of Onset  . Hypertension Mother   . Arthritis Mother   . COPD Mother   . Raynaud syndrome Mother   . Kidney failure Mother   . Heart failure Mother   . Cancer Father     lung & liver cancer  . Raynaud syndrome Sister   . Raynaud syndrome Sister     Social History Social History  Substance Use Topics  . Smoking status: Former Smoker    Years: 5.00  . Smokeless tobacco: Never Used     Comment: Quit smoking in 1980's  . Alcohol use 0.6 oz/week    1 Glasses of wine per week     Comment: occasional    Allergies  Allergen Reactions  . Benzoin     Blisters.  . Biaxin [Clarithromycin] Diarrhea    GI upset  . Sulfa Antibiotics Itching  . Tegretol  [Carbamazepine] Itching  .  Betadine  [Povidone Iodine] Rash    Current Outpatient Prescriptions  Medication Sig Dispense Refill  . Cholecalciferol (VITAMIN D3) 2000 UNITS TABS Take by mouth.    . diazepam (VALIUM) 5 MG tablet Take by mouth.    . DiphenhydrAMINE HCl, Sleep, 50 MG CAPS Take by mouth.    . estradiol (ESTRACE) 1 MG tablet Take 1 tablet (1 mg total) by mouth daily. 30 tablet 11  . fluticasone (FLONASE) 50 MCG/ACT nasal spray Place into the nose.    Marland Kitchen HYDROcodone-acetaminophen (NORCO) 5-325 MG tablet Take 1-2 tablets by mouth every 4 (four) hours as needed for moderate pain. 15 tablet 0  . omeprazole (PRILOSEC) 20 MG capsule Take 1 capsule by mouth two times daily with food 180 capsule 3  . sertraline (ZOLOFT) 100 MG tablet Take 1 tablet (100 mg total) by mouth daily. (Patient taking differently: Take 200 mg by mouth daily. ) 30 tablet 0  . celecoxib (CELEBREX) 200 MG capsule TAKE 1 CAPSULE BY MOUTH  DAILY (Patient not taking: Reported on 09/05/2016) 90 capsule 0   No current facility-administered medications for this visit.  Review of Systems Review of Systems  Constitutional: Negative.   Respiratory: Negative.   Cardiovascular: Negative.     Blood pressure 132/77, pulse 82, resp. rate 14, height 6' (1.829 m), weight 276 lb (125.2 kg).  Physical Exam Physical Exam  Constitutional: She is oriented to person, place, and time. She appears well-developed and well-nourished.  Eyes: Conjunctivae are normal. No scleral icterus.  Neck: Neck supple.  Abdominal: Soft. Normal appearance. There is no tenderness.    Lymphadenopathy:    She has no cervical adenopathy.  Neurological: She is alert and oriented to person, place, and time.  Skin: Skin is warm and dry.  Psychiatric: She has a normal mood and affect.       Assessment    Benign abdominal exam, no evidence of ventral hernia.    Plan    I think the possibility of a port site hernias quite small considering today's benign exam. The  patient noticed this area incidentally, and there is some widening of the scar but no evidence of subcutaneous or fascial defect. I would recommend against a CT scan at this time.  Patient was encouraged to call if she develops any pending tenderness or swelling, follow up otherwise would be on an as-needed basis.    This has been scribed by Lisa Rubenstein LPN    Lisa Stein 09/05/2016, 8:22 PM

## 2016-10-13 ENCOUNTER — Other Ambulatory Visit: Payer: Self-pay | Admitting: Neurological Surgery

## 2016-10-13 ENCOUNTER — Other Ambulatory Visit: Payer: Self-pay | Admitting: Family Medicine

## 2016-10-13 DIAGNOSIS — M5416 Radiculopathy, lumbar region: Secondary | ICD-10-CM

## 2016-10-18 ENCOUNTER — Ambulatory Visit
Admission: RE | Admit: 2016-10-18 | Discharge: 2016-10-18 | Disposition: A | Discharge status: Home / Self Care | Payer: 59 | Source: Ambulatory Visit | Attending: Neurological Surgery | Admitting: Neurological Surgery

## 2016-10-18 DIAGNOSIS — Z9889 Other specified postprocedural states: Secondary | ICD-10-CM | POA: Diagnosis not present

## 2016-10-18 DIAGNOSIS — M4316 Spondylolisthesis, lumbar region: Secondary | ICD-10-CM | POA: Insufficient documentation

## 2016-10-18 DIAGNOSIS — M5416 Radiculopathy, lumbar region: Secondary | ICD-10-CM | POA: Diagnosis not present

## 2016-10-18 DIAGNOSIS — M47896 Other spondylosis, lumbar region: Secondary | ICD-10-CM | POA: Insufficient documentation

## 2016-10-25 ENCOUNTER — Other Ambulatory Visit: Payer: Self-pay

## 2016-10-25 DIAGNOSIS — F411 Generalized anxiety disorder: Secondary | ICD-10-CM

## 2016-10-25 MED ORDER — SERTRALINE HCL 100 MG PO TABS: 200.0000 mg | ORAL_TABLET | Freq: Every day | ORAL | 1 refills | Status: DC

## 2016-11-17 ENCOUNTER — Observation Stay
Admission: EM | Admit: 2016-11-17 | Discharge: 2016-11-18 | Disposition: A | Discharge status: Home / Self Care | Payer: 59 | Source: Home / Self Care | Attending: Emergency Medicine | Admitting: Emergency Medicine

## 2016-11-17 ENCOUNTER — Ambulatory Visit: Payer: 59 | Admitting: Physician Assistant

## 2016-11-17 ENCOUNTER — Emergency Department: Payer: 59

## 2016-11-17 ENCOUNTER — Encounter: Payer: Self-pay | Admitting: *Deleted

## 2016-11-17 DIAGNOSIS — Z882 Allergy status to sulfonamides status: Secondary | ICD-10-CM

## 2016-11-17 DIAGNOSIS — Z888 Allergy status to other drugs, medicaments and biological substances status: Secondary | ICD-10-CM

## 2016-11-17 DIAGNOSIS — Z79899 Other long term (current) drug therapy: Secondary | ICD-10-CM

## 2016-11-17 DIAGNOSIS — F411 Generalized anxiety disorder: Secondary | ICD-10-CM

## 2016-11-17 DIAGNOSIS — J181 Lobar pneumonia, unspecified organism: Principal | ICD-10-CM

## 2016-11-17 DIAGNOSIS — Z90710 Acquired absence of both cervix and uterus: Secondary | ICD-10-CM

## 2016-11-17 DIAGNOSIS — Z87891 Personal history of nicotine dependence: Secondary | ICD-10-CM

## 2016-11-17 DIAGNOSIS — G8929 Other chronic pain: Secondary | ICD-10-CM | POA: Diagnosis present

## 2016-11-17 DIAGNOSIS — Z96619 Presence of unspecified artificial shoulder joint: Secondary | ICD-10-CM

## 2016-11-17 DIAGNOSIS — Z981 Arthrodesis status: Secondary | ICD-10-CM

## 2016-11-17 DIAGNOSIS — K219 Gastro-esophageal reflux disease without esophagitis: Secondary | ICD-10-CM | POA: Diagnosis present

## 2016-11-17 DIAGNOSIS — A419 Sepsis, unspecified organism: Secondary | ICD-10-CM | POA: Diagnosis present

## 2016-11-17 DIAGNOSIS — Z9049 Acquired absence of other specified parts of digestive tract: Secondary | ICD-10-CM

## 2016-11-17 DIAGNOSIS — Z96659 Presence of unspecified artificial knee joint: Secondary | ICD-10-CM

## 2016-11-17 DIAGNOSIS — N342 Other urethritis: Secondary | ICD-10-CM

## 2016-11-17 DIAGNOSIS — J189 Pneumonia, unspecified organism: Secondary | ICD-10-CM

## 2016-11-17 DIAGNOSIS — R0902 Hypoxemia: Secondary | ICD-10-CM | POA: Diagnosis present

## 2016-11-17 DIAGNOSIS — N951 Menopausal and female climacteric states: Secondary | ICD-10-CM

## 2016-11-17 DIAGNOSIS — I7300 Raynaud's syndrome without gangrene: Secondary | ICD-10-CM

## 2016-11-17 HISTORY — DX: Sepsis, unspecified organism: A41.9

## 2016-11-17 LAB — COMPREHENSIVE METABOLIC PANEL
ALK PHOS: 68 U/L (ref 38–126)
ALT: 19 U/L (ref 14–54)
ANION GAP: 9 (ref 5–15)
AST: 36 U/L (ref 15–41)
Albumin: 3.7 g/dL (ref 3.5–5.0)
BILIRUBIN TOTAL: 0.8 mg/dL (ref 0.3–1.2)
BUN: 11 mg/dL (ref 6–20)
CALCIUM: 8.6 mg/dL — AB (ref 8.9–10.3)
CO2: 23 mmol/L (ref 22–32)
CREATININE: 0.96 mg/dL (ref 0.44–1.00)
Chloride: 107 mmol/L (ref 101–111)
Glucose, Bld: 108 mg/dL — ABNORMAL HIGH (ref 65–99)
Potassium: 3.5 mmol/L (ref 3.5–5.1)
SODIUM: 139 mmol/L (ref 135–145)
TOTAL PROTEIN: 6.7 g/dL (ref 6.5–8.1)

## 2016-11-17 LAB — CBC WITH DIFFERENTIAL/PLATELET
BASOS PCT: 0 %
Band Neutrophils: 4 %
Basophils Absolute: 0 10*3/uL (ref 0–0.1)
Blasts: 0 %
EOS PCT: 2 %
Eosinophils Absolute: 0 10*3/uL (ref 0–0.7)
HCT: 37.8 % (ref 35.0–47.0)
HEMOGLOBIN: 12.7 g/dL (ref 12.0–16.0)
LYMPHS ABS: 0.4 10*3/uL — AB (ref 1.0–3.6)
LYMPHS PCT: 18 %
MCH: 26.7 pg (ref 26.0–34.0)
MCHC: 33.6 g/dL (ref 32.0–36.0)
MCV: 79.6 fL — AB (ref 80.0–100.0)
MYELOCYTES: 0 %
Metamyelocytes Relative: 0 %
Monocytes Absolute: 0 10*3/uL — ABNORMAL LOW (ref 0.2–0.9)
Monocytes Relative: 0 %
NEUTROS PCT: 76 %
NRBC: 0 /100{WBCs}
Neutro Abs: 1.7 10*3/uL (ref 1.4–6.5)
OTHER: 0 %
PROMYELOCYTES ABS: 0 %
Platelets: 231 10*3/uL (ref 150–440)
RBC: 4.75 MIL/uL (ref 3.80–5.20)
RDW: 13.9 % (ref 11.5–14.5)
WBC: 2.1 10*3/uL — AB (ref 3.6–11.0)

## 2016-11-17 LAB — URINALYSIS, COMPLETE (UACMP) WITH MICROSCOPIC
BILIRUBIN URINE: NEGATIVE
GLUCOSE, UA: NEGATIVE mg/dL
Ketones, ur: NEGATIVE mg/dL
NITRITE: POSITIVE — AB
PH: 6 (ref 5.0–8.0)
Protein, ur: NEGATIVE mg/dL
SPECIFIC GRAVITY, URINE: 1.008 (ref 1.005–1.030)

## 2016-11-17 LAB — PROCALCITONIN: Procalcitonin: 6.53 ng/mL

## 2016-11-17 LAB — TROPONIN I: Troponin I: <0.03 ng/mL (ref <0.03)

## 2016-11-17 LAB — LACTIC ACID, PLASMA: LACTIC ACID, VENOUS: 1.7 mmol/L (ref 0.5–1.9)

## 2016-11-17 LAB — INFLUENZA PANEL BY PCR (TYPE A & B)
INFLAPCR: NEGATIVE
Influenza B By PCR: NEGATIVE

## 2016-11-17 MED ORDER — ESTRADIOL 1 MG PO TABS
1.0000 mg | ORAL_TABLET | Freq: Every day | ORAL | Status: DC
  Administered 2016-11-17 – 2016-11-18 (×2): 1 mg via ORAL
  Filled 2016-11-17 (×2): qty 1

## 2016-11-17 MED ORDER — HYDROCODONE-ACETAMINOPHEN 5-325 MG PO TABS
1.0000 | ORAL_TABLET | ORAL | Status: DC | PRN
  Administered 2016-11-17: 2 via ORAL
  Administered 2016-11-18: 1 via ORAL
  Filled 2016-11-17: qty 1

## 2016-11-17 MED ORDER — SERTRALINE HCL 50 MG PO TABS
200.0000 mg | ORAL_TABLET | Freq: Every day | ORAL | Status: DC
  Administered 2016-11-17: 21:00:00 200 mg via ORAL
  Filled 2016-11-17: qty 4

## 2016-11-17 MED ORDER — ONDANSETRON HCL 4 MG PO TABS: 4.0000 mg | ORAL_TABLET | Freq: Four times a day (QID) | ORAL | Status: DC | PRN

## 2016-11-17 MED ORDER — PROMETHAZINE HCL 25 MG/ML IJ SOLN
INTRAMUSCULAR | Status: AC
  Administered 2016-11-17: 25 mg via INTRAVENOUS
  Filled 2016-11-17: qty 1

## 2016-11-17 MED ORDER — DIPHENHYDRAMINE HCL 25 MG PO CAPS
50.0000 mg | ORAL_CAPSULE | Freq: Every evening | ORAL | Status: DC | PRN
Start: 2016-11-17 — End: 2016-11-18

## 2016-11-17 MED ORDER — ENOXAPARIN SODIUM 40 MG/0.4ML ~~LOC~~ SOLN
40.0000 mg | SUBCUTANEOUS | Status: DC
  Administered 2016-11-17: 40 mg via SUBCUTANEOUS
  Filled 2016-11-17: qty 0.4

## 2016-11-17 MED ORDER — SODIUM CHLORIDE 0.9 % IV BOLUS (SEPSIS)
1000.0000 mL | Freq: Once | INTRAVENOUS | Status: AC
  Administered 2016-11-17: 1000 mL via INTRAVENOUS

## 2016-11-17 MED ORDER — CELECOXIB 200 MG PO CAPS
200.0000 mg | ORAL_CAPSULE | Freq: Every day | ORAL | Status: DC
  Administered 2016-11-17 – 2016-11-18 (×2): 200 mg via ORAL
  Filled 2016-11-17 (×2): qty 1

## 2016-11-17 MED ORDER — ACETAMINOPHEN 325 MG PO TABS
650.0000 mg | ORAL_TABLET | Freq: Once | ORAL | Status: AC
  Administered 2016-11-17: 650 mg via ORAL
  Filled 2016-11-17: qty 2

## 2016-11-17 MED ORDER — ONDANSETRON HCL 4 MG/2ML IJ SOLN: 4.0000 mg | Freq: Four times a day (QID) | INTRAMUSCULAR | Status: DC | PRN

## 2016-11-17 MED ORDER — HYDROCODONE-ACETAMINOPHEN 5-325 MG PO TABS
ORAL_TABLET | ORAL | Status: AC
  Administered 2016-11-17: 2 via ORAL
  Filled 2016-11-17: qty 2

## 2016-11-17 MED ORDER — DEXTROSE 5 % IV SOLN
500.0000 mg | INTRAVENOUS | Status: DC
  Administered 2016-11-18: 500 mg via INTRAVENOUS
  Filled 2016-11-17: qty 500

## 2016-11-17 MED ORDER — DIAZEPAM 5 MG PO TABS: 5.0000 mg | ORAL_TABLET | Freq: Two times a day (BID) | ORAL | Status: DC | PRN

## 2016-11-17 MED ORDER — LEVOFLOXACIN IN D5W 750 MG/150ML IV SOLN
750.0000 mg | Freq: Once | INTRAVENOUS | Status: AC
  Administered 2016-11-17: 750 mg via INTRAVENOUS
  Filled 2016-11-17: qty 150

## 2016-11-17 MED ORDER — IPRATROPIUM-ALBUTEROL 0.5-2.5 (3) MG/3ML IN SOLN
3.0000 mL | Freq: Once | RESPIRATORY_TRACT | Status: AC
  Administered 2016-11-17: 3 mL via RESPIRATORY_TRACT
  Filled 2016-11-17: qty 3

## 2016-11-17 MED ORDER — PROMETHAZINE HCL 25 MG/ML IJ SOLN
25.0000 mg | Freq: Once | INTRAMUSCULAR | Status: AC
  Administered 2016-11-17: 25 mg via INTRAVENOUS

## 2016-11-17 MED ORDER — PANTOPRAZOLE SODIUM 40 MG PO TBEC
40.0000 mg | DELAYED_RELEASE_TABLET | Freq: Every day | ORAL | Status: DC
  Administered 2016-11-17 – 2016-11-18 (×2): 40 mg via ORAL
  Filled 2016-11-17 (×2): qty 1

## 2016-11-17 MED ORDER — DOCUSATE SODIUM 100 MG PO CAPS
100.0000 mg | ORAL_CAPSULE | Freq: Two times a day (BID) | ORAL | Status: DC
  Administered 2016-11-17 – 2016-11-18 (×2): 100 mg via ORAL
  Filled 2016-11-17 (×2): qty 1

## 2016-11-17 MED ORDER — ACETAMINOPHEN 650 MG RE SUPP: 650.0000 mg | Freq: Four times a day (QID) | RECTAL | Status: DC | PRN

## 2016-11-17 MED ORDER — VITAMIN D 1000 UNITS PO TABS
1000.0000 [IU] | ORAL_TABLET | Freq: Every day | ORAL | Status: DC
  Administered 2016-11-17 – 2016-11-18 (×2): 1000 [IU] via ORAL
  Filled 2016-11-17 (×2): qty 1

## 2016-11-17 MED ORDER — ACETAMINOPHEN 325 MG PO TABS
650.0000 mg | ORAL_TABLET | Freq: Four times a day (QID) | ORAL | Status: DC | PRN
  Administered 2016-11-18: 650 mg via ORAL
  Filled 2016-11-17: qty 2

## 2016-11-17 MED ORDER — DEXTROSE 5 % IV SOLN
1.0000 g | INTRAVENOUS | Status: DC
  Administered 2016-11-18: 1 g via INTRAVENOUS
  Filled 2016-11-17: qty 10

## 2016-11-17 NOTE — ED Provider Notes (Signed)
[facility] Provider Note   CSN: [id number] Arrival date & time: 11/17/16  1010     History   Chief Complaint Chief Complaint  Patient presents with  . Chills  . Nausea    HPI FOSTER STOGSDILL is a 56 y.o. female hx of GERD, lymphedema, recent flu in January finished a course of tamiflu here with cough, fever, chills. Patient states that the nonproductive cough for several days. He woke up last night from "violent chills". Very nauseated as well. She took her temperature this morning and was normal but had subjective chills and fevers or came here for evaluation. Has productive cough for several days as well. States that she has some right rib pain and soreness but denies any trauma.     The history is provided by the patient.    Past Medical History:  Diagnosis Date  . Anxiety   . Depression   . GERD (gastroesophageal reflux disease)   . Lymphedema   . Neuralgia   . Osteoarthritis   . Raynaud disease     Patient Active Problem List   Diagnosis Date Noted  . Skin nodule 09/05/2016  . Menopause 07/21/2016  . Clinical depression 01/12/2016  . Difficult or painful urination 03/16/2015  . Fatigue 03/16/2015  . Anxiety, generalized 03/16/2015  . Acid reflux 03/16/2015  . Cardiac murmur 03/16/2015  . H/O major abdominal surgery 03/16/2015  . Calculus of kidney 03/16/2015  . Acquired lymphedema 03/16/2015  . Affective disorder, major 03/16/2015  . Arthritis, degenerative 03/16/2015  . Adiposity 03/16/2015  . Raynaud's syndrome 03/16/2015  . Muscle rigidity 03/16/2015  . Fothergill's neuralgia 03/16/2015  . Avitaminosis D 03/16/2015  . DDD (degenerative disc disease), lumbar 11/20/2014  . Neuritis or radiculitis due to rupture of lumbar intervertebral disc 11/20/2014  . Lower abdominal pain 08/05/2014  . Diarrhea 08/05/2014    Past Surgical History:  Procedure Laterality Date  . ABDOMINAL HYSTERECTOMY  2003  . ANKLE FRACTURE SURGERY  2010  .  Bunionectomy Right   . 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  . COLONOSCOPY  2010   Dr. Vira Agar  . COLONOSCOPY  08-07-14   Dr Bary Castilla  . HEMICOLECTOMY  08/08/14  . REPLACEMENT TOTAL KNEE  2009  . sinus problem  2004   Sinuses opened up  . SPINE SURGERY  08/02/2016   spinal fusion - [facility]  . TOOTH EXTRACTION Right 2016  . TOTAL SHOULDER REPLACEMENT  2008,2009    OB History    Gravida Para Term Preterm AB Living   2 2       2    SAB TAB Ectopic Multiple Live Births                  Obstetric Comments   1st Menstrual Cycle: 11 1st Pregnancy: 19        Home Medications    Prior to Admission medications   Medication Sig Start Date End Date Taking? Authorizing Provider  celecoxib (CELEBREX) 200 MG capsule TAKE 1 CAPSULE BY MOUTH  DAILY 10/14/16  Yes Richard Maceo Pro., MD  Cholecalciferol (VITAMIN D3) 2000 UNITS TABS Take 2,000 units of lipase by mouth as needed.  04/22/13  Yes Historical Provider, MD  diazepam (VALIUM) 5 MG tablet Take 5 mg by mouth every 12 (twelve) hours as needed.  04/22/13  Yes Historical Provider, MD  DiphenhydrAMINE HCl, Sleep, 50 MG CAPS Take 50  mg by mouth at bedtime as needed.  05/31/11  Yes Historical Provider, MD  estradiol (ESTRACE) 1 MG tablet Take 1 tablet (1 mg total) by mouth daily. 06/24/16  Yes Richard Maceo Pro., MD  fluticasone [facility]) 50 MCG/ACT nasal spray Place 1 spray into the nose daily as needed.  12/05/13  Yes Historical Provider, MD  omeprazole (PRILOSEC) 20 MG capsule Take 1 capsule by mouth two times daily with food 05/23/16  Yes Richard Maceo Pro., MD  sertraline (ZOLOFT) 100 MG tablet Take 2 tablets (200 mg total) by mouth daily. 10/25/16  Yes Richard Maceo Pro., MD  HYDROcodone-acetaminophen [facility]) 5-325 MG tablet Take 1-2 tablets by mouth every 4 (four) hours as needed for moderate pain. 11/30/15   Arlyss Repress, PA-C     Family History Family History  Problem Relation Age of Onset  . Hypertension Mother   . Arthritis Mother   . COPD Mother   . Raynaud syndrome Mother   . Kidney failure Mother   . Heart failure Mother   . Cancer Father     lung & liver cancer  . Raynaud syndrome Sister   . Raynaud syndrome Sister     Social History Social History  Substance Use Topics  . Smoking status: Former Smoker    Years: 5.00  . Smokeless tobacco: Never Used     Comment: Quit smoking in 1980's  . Alcohol use 0.6 oz/week    1 Glasses of wine per week     Comment: occasional     Allergies   Benzoin; Biaxin [clarithromycin]; Sulfa antibiotics; Tegretol  [carbamazepine]; and Betadine  [povidone iodine]   Review of Systems Review of Systems  Constitutional: Positive for fever.  Respiratory: Positive for cough.   All other systems reviewed and are negative.    Physical Exam Updated Vital Signs BP (!) 109/58   Pulse (!) 109   Temp (!) 101.3 F (38.5 C) (Oral)   Resp (!) 26   Ht 6' (1.829 m)   Wt 276 lb (125.2 kg)   SpO2 100%   BMI 37.43 kg/m   Physical Exam  Constitutional: She is oriented to person, place, and time.  Slightly uncomfortable, slightly dehydrated   HENT:  Head: Normocephalic.  MM slightly dry   Eyes: EOM are normal. Pupils are equal, round, and reactive to light.  Neck: Normal range of motion. Neck supple.  Cardiovascular: Normal rate, regular rhythm and normal heart sounds.   Pulmonary/Chest:  Mild R rib tenderness, ? Crackles R base   Abdominal: Soft. Bowel sounds are normal. She exhibits no distension. There is no tenderness.  Musculoskeletal: Normal range of motion.  Neurological: She is alert and oriented to person, place, and time. She displays normal reflexes. No cranial nerve deficit. Coordination normal.  Skin: Skin is warm.  Psychiatric: She has a normal mood and affect.  Nursing note and vitals reviewed.    ED Treatments / Results  Labs (all labs  ordered are listed, but only abnormal results are displayed) Labs Reviewed  CBC WITH DIFFERENTIAL/PLATELET - Abnormal; Notable for the following:       Result Value   WBC 2.1 (*)    MCV 79.6 (*)    Lymphs Abs 0.4 (*)    Monocytes Absolute 0.0 (*)    All other components within normal limits  COMPREHENSIVE METABOLIC PANEL - Abnormal; Notable for the following:    Glucose, Bld 108 (*)    Calcium 8.6 (*)  All other components within normal limits  URINALYSIS, COMPLETE (UACMP) WITH MICROSCOPIC - Abnormal; Notable for the following:    Color, Urine YELLOW (*)    APPearance CLEAR (*)    Hgb urine dipstick SMALL (*)    Nitrite POSITIVE (*)    Leukocytes, UA MODERATE (*)    Bacteria, UA MANY (*)    Squamous Epithelial / LPF 0-5 (*)    All other components within normal limits  CULTURE, BLOOD (ROUTINE X 2)  CULTURE, BLOOD (ROUTINE X 2)  INFLUENZA PANEL BY PCR (TYPE A & B)  TROPONIN I  LACTIC ACID, PLASMA    EKG  EKG Interpretation None      ED ECG REPORT I, Wandra Arthurs, the attending physician, personally viewed and interpreted this ECG.   Date: 11/17/2016  EKG Time:  10:24 am  Rate: 104  Rhythm: sinus tachycardia  Axis: normal  Intervals:none  ST&T Change: nonspecific    Radiology Dg Chest 2 View  Result Date: 11/17/2016 CLINICAL DATA:  Onset of shaking chills and nausea in the night. Right ribcage discomfort today. History of gastroesophageal reflux, Raynaud's disease, former smoker. EXAM: CHEST  2 VIEW COMPARISON:  PA and lateral chest x-ray of April 17, 2013 FINDINGS: The lungs remain mildly hyperinflated. The lung markings are coarse over the lower thoracic spine which likely lies in the left lower lobe. The heart and pulmonary vascularity are normal. The mediastinum is normal in width. The trachea is midline. There is no pleural effusion. The bony thorax exhibits no acute abnormality. IMPRESSION: Increased density in the left lower lobe compatible with atelectasis or  pneumonia. No CHF. Followup PA and lateral chest X-ray is recommended in 3-4 weeks following trial of antibiotic therapy to ensure resolution. Electronically Signed   By: Albie Arizpe  [place] M.D.   On: 11/17/2016 10:47    Procedures Procedures (including critical care time)  Medications Ordered in ED Medications  sodium chloride 0.9 % bolus 1,000 mL (not administered)  sodium chloride 0.9 % bolus 1,000 mL (0 mLs Intravenous Stopped 11/17/16 1219)  acetaminophen (TYLENOL) tablet 650 mg (650 mg Oral Given 11/17/16 1020)  levofloxacin (LEVAQUIN) IVPB 750 mg (0 mg Intravenous Stopped 11/17/16 1302)  promethazine (PHENERGAN) injection 25 mg (25 mg Intravenous Given 11/17/16 1222)  sodium chloride 0.9 % bolus 1,000 mL (0 mLs Intravenous Stopped 11/17/16 1302)  ipratropium-albuterol (DUONEB) 0.5-2.5 (3) MG/3ML nebulizer solution 3 mL (3 mLs Nebulization Given 11/17/16 1301)     Initial Impression / Assessment and Plan / ED Course  I have reviewed the triage vital signs and the nursing notes.  Pertinent labs & imaging results that were available during my care of the patient were reviewed by me and considered in my medical decision making (see chart for details).     NAHLANI GRIGG is a 56 y.o. female here with R rib pain, fever, cough. Consider recurrent flu vs pneumonia vs viral syndrome. Will get labs, CXR, flu. Will hydrate and reassess.   1:58 PM BP dropped to 90s. O2 85-89% on RA. Placed on 2 L [place]. CXR LL lobe pneumonia. UA + UTI as well. Given levaquin. Tried to give albuterol but then desat to 86% on RA. Added lactate, cultures. Will admit for hypoxia from CAP, UTI.    Final Clinical Impressions(s) / ED Diagnoses   Final diagnoses:  None    New Prescriptions New Prescriptions   No medications on file     Drenda Freeze, MD 11/17/16 1400

## 2016-11-17 NOTE — ED Notes (Signed)
EDP at bedside  

## 2016-11-17 NOTE — H&P (Signed)
[facility] [place] NAME: Lisa Stein    MR#:  [id number]  DATE OF BIRTH:  [birth year]  DATE OF ADMISSION:  11/17/2016  PRIMARY CARE PHYSICIAN: [name], MD   REQUESTING/REFERRING PHYSICIAN: Dr. Shirlyn Goltz  CHIEF COMPLAINT:   Chief Complaint  Patient presents with  . Chills  . Nausea    HISTORY OF PRESENT ILLNESS:  [place] Temple  is a 56 y.o. female with a known history of osteoarthritis, back pain , prior back surgeries, anxiety and depression,GERD and lymphedema presents to hospital secondary to sudden onset of chills a fever last night. She was in her normal state of health until last night, went to bed normally. Woke up in the middle of night with fevers and cold chills. Denies any cough, dyspnea. No recent travel or exposure to sick contacts. Had episodes of nausea and vomiting this morning. Now resolved. Denies any abdominal pain or diarrhea. She was tachycardic on presentation, hypoxic requiring oxygen in the emergency room. White count is actually on the lower side. Chest x-ray revealing left lower lobe pneumonia.  PAST MEDICAL HISTORY:   Past Medical History:  Diagnosis Date  . Anxiety   . Depression   . GERD (gastroesophageal reflux disease)   . Lymphedema   . Neuralgia   . Osteoarthritis   . Raynaud disease     PAST SURGICAL HISTORY:   Past Surgical History:  Procedure Laterality Date  . ABDOMINAL HYSTERECTOMY  2003  . ANKLE FRACTURE SURGERY  2010  . Bunionectomy Right   . 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  . COLONOSCOPY  2010   Dr. Vira Agar  . COLONOSCOPY  08-07-14   Dr Bary Castilla  . HEMICOLECTOMY  08/08/14  . REPLACEMENT TOTAL KNEE  2009  . sinus problem  2004   Sinuses opened up  . SPINE SURGERY  08/02/2016   spinal fusion - [facility]  . TOOTH EXTRACTION Right 2016  . TOTAL SHOULDER REPLACEMENT   2008,2009    SOCIAL HISTORY:   Social History  Substance Use Topics  . Smoking status: Former Smoker    Years: 5.00  . Smokeless tobacco: Never Used     Comment: Quit smoking in 1980's  . Alcohol use 0.6 oz/week    1 Glasses of wine per week     Comment: occasional    FAMILY HISTORY:   Family History  Problem Relation Age of Onset  . Hypertension Mother   . Arthritis Mother   . COPD Mother   . Raynaud syndrome Mother   . Kidney failure Mother   . Heart failure Mother   . Cancer Father     lung & liver cancer  . Raynaud syndrome Sister   . Raynaud syndrome Sister     DRUG ALLERGIES:   Allergies  Allergen Reactions  . Benzoin     Blisters.  . Biaxin [Clarithromycin] Diarrhea    GI upset  . Sulfa Antibiotics Itching  . Tegretol  [Carbamazepine] Itching  . Betadine  [Povidone Iodine] Rash    REVIEW OF SYSTEMS:   Review of Systems  Constitutional: Positive for chills. Negative for fever, malaise/fatigue and weight loss.  HENT: Negative for ear discharge, ear pain, nosebleeds and tinnitus.   Eyes: Negative for blurred vision, double vision and photophobia.  Respiratory: Negative for cough, hemoptysis, shortness of breath and wheezing.   Cardiovascular: Positive for  leg swelling. Negative for chest pain, palpitations and orthopnea.  Gastrointestinal: Negative for abdominal pain, constipation, diarrhea, heartburn, melena, nausea and vomiting.  Genitourinary: Negative for dysuria, frequency, hematuria and urgency.  Musculoskeletal: Positive for back pain. Negative for myalgias and neck pain.  Skin: Negative for rash.  Neurological: Negative for dizziness, sensory change, speech change, focal weakness and headaches.  Endo/Heme/Allergies: Does not bruise/bleed easily.  Psychiatric/Behavioral: Negative for depression.    MEDICATIONS AT HOME:   Prior to Admission medications   Medication Sig Start Date End Date Taking? Authorizing Provider  Cholecalciferol  (VITAMIN D3) 2000 UNITS TABS Take 2,000 units of lipase by mouth as needed.  04/22/13  Yes Historical Provider, MD  diazepam (VALIUM) 5 MG tablet Take 5 mg by mouth every 12 (twelve) hours as needed.  04/22/13  Yes Historical Provider, MD  DiphenhydrAMINE HCl, Sleep, 50 MG CAPS Take 50 mg by mouth at bedtime as needed.  05/31/11  Yes Historical Provider, MD  estradiol (ESTRACE) 1 MG tablet Take 1 tablet (1 mg total) by mouth daily. 06/24/16  Yes Richard Maceo Pro., MD  fluticasone [facility]) 50 MCG/ACT nasal spray Place 1 spray into the nose daily as needed.  12/05/13  Yes Historical Provider, MD  omeprazole (PRILOSEC) 20 MG capsule Take 1 capsule by mouth two times daily with food 05/23/16  Yes Richard Maceo Pro., MD  sertraline (ZOLOFT) 100 MG tablet Take 2 tablets (200 mg total) by mouth daily. 10/25/16  Yes Richard Maceo Pro., MD  HYDROcodone-acetaminophen [facility]) 5-325 MG tablet Take 1-2 tablets by mouth every 4 (four) hours as needed for moderate pain. 11/30/15   Arlyss Repress, PA-C      VITAL SIGNS:  Blood pressure (!) 109/58, pulse 100, temperature (!) 101.3 F (38.5 C), temperature source Oral, resp. rate 18, height 6' (1.829 m), weight 125.2 kg (276 lb), SpO2 (!) 88 %.  PHYSICAL EXAMINATION:   Physical Exam  GENERAL:  56 y.o.-year-old patient lying in the bed with no acute distress.  EYES: Pupils equal, round, reactive to light and accommodation. No scleral icterus. Extraocular muscles intact.  HEENT: Head atraumatic, normocephalic. Oropharynx and nasopharynx clear.  NECK:  Supple, no jugular venous distention. No thyroid enlargement, no tenderness.  LUNGS: Normal breath sounds bilaterally, no wheezing, rales,rhonchi or crepitation. No use of accessory muscles of respiration.  CARDIOVASCULAR: S1, S2 normal. No murmurs, rubs, or gallops.  ABDOMEN: Soft, nontender, nondistended. Bowel sounds present. No organomegaly or mass.  EXTREMITIES: No cyanosis, or clubbing. Both legs  edematous NEUROLOGIC: Cranial nerves II through XII are intact. Muscle strength 5/5 in all extremities. Sensation intact. Gait not checked.  PSYCHIATRIC: The patient is alert and oriented x 3.  SKIN: No obvious rash, lesion, or ulcer.   LABORATORY PANEL:   CBC  Recent Labs Lab 11/17/16 1016  WBC 2.1*  HGB 12.7  HCT 37.8  PLT 231   ------------------------------------------------------------------------------------------------------------------  Chemistries   Recent Labs Lab 11/17/16 1016  NA 139  K 3.5  CL 107  CO2 23  GLUCOSE 108*  BUN 11  CREATININE 0.96  CALCIUM 8.6*  AST 36  ALT 19  ALKPHOS 68  BILITOT 0.8   ------------------------------------------------------------------------------------------------------------------  Cardiac Enzymes  Recent Labs Lab 11/17/16 1016  TROPONINI <0.03   ------------------------------------------------------------------------------------------------------------------  RADIOLOGY:  Dg Chest 2 View  Result Date: 11/17/2016 CLINICAL DATA:  Onset of shaking chills and nausea in the night. Right ribcage discomfort today. History of gastroesophageal reflux, Raynaud's disease, former smoker. EXAM: CHEST  2 VIEW COMPARISON:  PA and lateral chest x-ray of April 17, 2013 FINDINGS: The lungs remain mildly hyperinflated. The lung markings are coarse over the lower thoracic spine which likely lies in the left lower lobe. The heart and pulmonary vascularity are normal. The mediastinum is normal in width. The trachea is midline. There is no pleural effusion. The bony thorax exhibits no acute abnormality. IMPRESSION: Increased density in the left lower lobe compatible with atelectasis or pneumonia. No CHF. Followup PA and lateral chest X-ray is recommended in 3-4 weeks following trial of antibiotic therapy to ensure resolution. Electronically Signed   By: David  Martinique M.D.   On: 11/17/2016 10:47    EKG:   Orders placed or performed during  the hospital encounter of 11/17/16  . EKG 12-Lead  . EKG 12-Lead  . EKG 12-Lead  . EKG 12-Lead    IMPRESSION AND PLAN:   Eria Kalkwarf  is a 56 y.o. female with a known history of osteoarthritis, back pain , prior back surgeries, anxiety and depression,GERD and lymphedema presents to hospital secondary to sudden onset of chills a fever last night.  #1 sepsis-secondary to left lower lobe pneumonia. Blood cultures are pending. Started on Rocephin and azithromycin. -Oxygen support as needed. wean as tolerated.  #2 GERD- PPI  #3 Back pain- chronic, better. Continue home meds  #4 Acute cystitis- urine cultures, already on rocephin  #5 DVT prophylaxis- lovenox    All the records are reviewed and case discussed with ED provider. Management plans discussed with the patient, family and they are in agreement.  CODE STATUS: Full code  TOTAL TIME TAKING CARE OF THIS PATIENT: 50 minutes.    Gladstone Lighter M.D on 11/17/2016 at 2:29 PM  Between 7am to 6pm - Pager - [phone]  After 6pm go to [email or web address] - password [email or web address]  Office  [phone]  CC: Primary care physician; [name], MD

## 2016-11-17 NOTE — ED Notes (Signed)
Pt ambulated and oxygen saturation dropped from 95% to 88%. Pt remained off oxygen and continued to drop to 84%. Pt placed back on 2L oxygen. MD aware.

## 2016-11-17 NOTE — ED Triage Notes (Signed)
Pt states she woke up in the middle of the night to roll over and began "volently shaking", states she had chills and nausea, at present pt awake and alert, states rib soreness on the right

## 2016-11-17 NOTE — Consult Note (Signed)
Pharmacy Antibiotic Note  Lisa Stein is a 56 y.o. female admitted on 11/17/2016 with pneumonia.  Pharmacy has been consulted for ceftriaxone dosing. Pt also ordered azithromycin. Received 1 dose of levofloxacin in the ED  Plan: ceftriaxone 1g q 24 hours starting tomorrow AM  Height: 6' (182.9 cm) Weight: 276 lb (125.2 kg) IBW/kg (Calculated) : 73.1  Temp (24hrs), Avg:101.3 F (38.5 C), Min:101.3 F (38.5 C), Max:101.3 F (38.5 C)   Recent Labs Lab 11/17/16 1016 11/17/16 1341  WBC 2.1*  --   CREATININE 0.96  --   LATICACIDVEN  --  1.7    Estimated Creatinine Clearance: 94.7 mL/min (by C-G formula based on SCr of 0.96 mg/dL).    Allergies  Allergen Reactions  . Benzoin     Blisters.  . Biaxin [Clarithromycin] Diarrhea    GI upset  . Sulfa Antibiotics Itching  . Tegretol  [Carbamazepine] Itching  . Betadine  [Povidone Iodine] Rash    Antimicrobials this admission: levofloxacin 2/15 >> one dose azithromycin 2/16 >>  Ceftriaxone 2/16>>  Dose adjustments this admission:   Microbiology results: 2/15 BCx:  2/15 PCT:  Thank you for allowing pharmacy to be a part of this patient's care.  Ramond Dial, Pharm.D, BCPS Clinical Pharmacist  11/17/2016 3:26 PM

## 2016-11-17 NOTE — ED Notes (Signed)
Pt placed on oxygen due to low oxygen saturation. MD made aware.

## 2016-11-17 NOTE — Progress Notes (Signed)
Pt admitted before shift change. Denies pain. O2 sats in the high 90's on 2L O2 per [name].

## 2016-11-18 ENCOUNTER — Telehealth: Payer: Self-pay | Admitting: Family Medicine

## 2016-11-18 LAB — BASIC METABOLIC PANEL
Anion gap: 4 — ABNORMAL LOW (ref 5–15)
BUN: 14 mg/dL (ref 6–20)
CALCIUM: 7.9 mg/dL — AB (ref 8.9–10.3)
CHLORIDE: 111 mmol/L (ref 101–111)
CO2: 27 mmol/L (ref 22–32)
CREATININE: 1.27 mg/dL — AB (ref 0.44–1.00)
GFR calc Af Amer: 53 mL/min — ABNORMAL LOW (ref >60)
GFR calc non Af Amer: 46 mL/min — ABNORMAL LOW (ref >60)
GLUCOSE: 89 mg/dL (ref 65–99)
Potassium: 3.9 mmol/L (ref 3.5–5.1)
Sodium: 142 mmol/L (ref 135–145)

## 2016-11-18 LAB — CBC
HCT: 33.9 % — ABNORMAL LOW (ref 35.0–47.0)
Hemoglobin: 11 g/dL — ABNORMAL LOW (ref 12.0–16.0)
MCH: 26.8 pg (ref 26.0–34.0)
MCHC: 32.6 g/dL (ref 32.0–36.0)
MCV: 82.4 fL (ref 80.0–100.0)
PLATELETS: 170 10*3/uL (ref 150–440)
RBC: 4.12 MIL/uL (ref 3.80–5.20)
RDW: 14.6 % — ABNORMAL HIGH (ref 11.5–14.5)
WBC: 7.1 10*3/uL (ref 3.6–11.0)

## 2016-11-18 MED ORDER — LEVOFLOXACIN 750 MG PO TABS: 750.0000 mg | ORAL_TABLET | Freq: Every day | ORAL | 0 refills | Status: DC

## 2016-11-18 MED ORDER — SODIUM CHLORIDE 0.9 % IV SOLN
INTRAVENOUS | Status: DC
  Administered 2016-11-18: 08:00:00 via INTRAVENOUS

## 2016-11-18 NOTE — Care Management (Signed)
Spoke again with patient and she states she is doing well off O2 and has ambulated to bathroom. She states she has ride to home today without issue. No RNCM needs.

## 2016-11-18 NOTE — Telephone Encounter (Signed)
Please call for transition of care. Thank you-aa 

## 2016-11-18 NOTE — Discharge Summary (Signed)
[facility] [place] NAME: Lisa Stein    MR#:  [id number]  DATE OF BIRTH:  August 04, 1961  DATE OF ADMISSION:  11/17/2016 ADMITTING PHYSICIAN: Gladstone Lighter, MD  DATE OF DISCHARGE: 11/18/2016  PRIMARY CARE PHYSICIAN: [name], MD    ADMISSION DIAGNOSIS:  Infective urethritis [N34.2] Community acquired pneumonia of left lower lobe of lung ([place]) [J18.1]  DISCHARGE DIAGNOSIS:  Active Problems:   Sepsis ([place])   SECONDARY DIAGNOSIS:   Past Medical History:  Diagnosis Date  . Anxiety   . Depression   . GERD (gastroesophageal reflux disease)   . Lymphedema   . Neuralgia   . Osteoarthritis   . Raynaud disease     HOSPITAL COURSE:   56 year old female with history of osteoarthritis who presented with sudden onset of chills and fever and found to have pneumonia.  1. Sepsis with fever and leukopenia and relative hypotension: These of all improved. Sepsis is due to community-acquired pneumonia.  2. Community-acquired pneumonia: Patient was initiated on azithromycin and Rocephin. She will be discharged on oral Levaquin. But cultures are negative to date.  3. Hypotension from sepsis: This is improved 70 fluids  4. Urinary tract infection: Patient will continue Levaquin  5. Back pain, chronic: Patient will continue home medications  6. GERD: Continue PPI    DISCHARGE CONDITIONS AND DIET:  Stable Regular diet  CONSULTS OBTAINED:    DRUG ALLERGIES:   Allergies  Allergen Reactions  . Benzoin     Blisters.  . Biaxin [Clarithromycin] Diarrhea    GI upset  . Sulfa Antibiotics Itching  . Tegretol  [Carbamazepine] Itching  . Betadine  [Povidone Iodine] Rash    DISCHARGE MEDICATIONS:   Current Discharge Medication List    START taking these medications   Details  levofloxacin (LEVAQUIN) 750 MG tablet Take 1 tablet (750 mg total) by mouth daily. Qty: 5 tablet, Refills: 0      CONTINUE these medications  which have NOT CHANGED   Details  Cholecalciferol (VITAMIN D3) 2000 UNITS TABS Take 2,000 units of lipase by mouth as needed.     diazepam (VALIUM) 5 MG tablet Take 5 mg by mouth every 12 (twelve) hours as needed.     DiphenhydrAMINE HCl, Sleep, 50 MG CAPS Take 50 mg by mouth at bedtime as needed.     estradiol (ESTRACE) 1 MG tablet Take 1 tablet (1 mg total) by mouth daily. Qty: 30 tablet, Refills: 11   Associated Diagnoses: Menopausal symptom    fluticasone (FLONASE) 50 MCG/ACT nasal spray Place 1 spray into the nose daily as needed.     omeprazole (PRILOSEC) 20 MG capsule Take 1 capsule by mouth two times daily with food Qty: 180 capsule, Refills: 3    sertraline (ZOLOFT) 100 MG tablet Take 2 tablets (200 mg total) by mouth daily. Qty: 180 tablet, Refills: 1   Associated Diagnoses: Anxiety, generalized    HYDROcodone-acetaminophen (NORCO) 5-325 MG tablet Take 1-2 tablets by mouth every 4 (four) hours as needed for moderate pain. Qty: 15 tablet, Refills: 0          Today   CHIEF COMPLAINT:  Doing well this am better a little weak but improved symptoms   VITAL SIGNS:  Blood pressure (!) 119/56, pulse 76, temperature 97.8 F (36.6 C), temperature source Oral, resp. rate 18, height 6' (1.829 m), weight 126.6 kg (279 lb 3.2 oz), SpO2 100 %.   REVIEW OF SYSTEMS:  Review of Systems  Constitutional: Positive for malaise/fatigue. Negative for chills and fever.  HENT: Negative.  Negative for ear discharge, ear pain, hearing loss, nosebleeds and sore throat.   Eyes: Negative.  Negative for blurred vision and pain.  Respiratory: Negative.  Negative for cough, hemoptysis, shortness of breath and wheezing.   Cardiovascular: Negative.  Negative for chest pain, palpitations and leg swelling.  Gastrointestinal: Negative.  Negative for abdominal pain, blood in stool, diarrhea, nausea and vomiting.  Genitourinary: Negative.  Negative for dysuria.  Musculoskeletal: Negative.   Negative for back pain.  Skin: Negative.   Neurological: Negative for dizziness, tremors, speech change, focal weakness, seizures and headaches.  Endo/Heme/Allergies: Negative.  Does not bruise/bleed easily.  Psychiatric/Behavioral: Negative.  Negative for depression, hallucinations and suicidal ideas.     PHYSICAL EXAMINATION:  GENERAL:  56 y.o.-year-old patient lying in the bed with no acute distress.  NECK:  Supple, no jugular venous distention. No thyroid enlargement, no tenderness.  LUNGS: crackles left base no wheezing, rales,rhonchi  No use of accessory muscles of respiration.  CARDIOVASCULAR: S1, S2 normal. No murmurs, rubs, or gallops.  ABDOMEN: Soft, non-tender, non-distended. Bowel sounds present. No organomegaly or mass.  EXTREMITIES: No pedal edema, cyanosis, or clubbing.  PSYCHIATRIC: The patient is alert and oriented x 3.  SKIN: No obvious rash, lesion, or ulcer.   DATA REVIEW:   CBC  Recent Labs Lab 11/18/16 0649  WBC 7.1  HGB 11.0*  HCT 33.9*  PLT 170    Chemistries   Recent Labs Lab 11/17/16 1016 11/18/16 0649  NA 139 142  K 3.5 3.9  CL 107 111  CO2 23 27  GLUCOSE 108* 89  BUN 11 14  CREATININE 0.96 1.27*  CALCIUM 8.6* 7.9*  AST 36  --   ALT 19  --   ALKPHOS 68  --   BILITOT 0.8  --     Cardiac Enzymes  Recent Labs Lab 11/17/16 1016  TROPONINI <0.03    Microbiology Results  @[email or web address] @  RADIOLOGY:  Dg Chest 2 View  Result Date: 11/17/2016 CLINICAL DATA:  Onset of shaking chills and nausea in the night. Right ribcage discomfort today. History of gastroesophageal reflux, Raynaud's disease, former smoker. EXAM: CHEST  2 VIEW COMPARISON:  PA and lateral chest x-ray of April 17, 2013 FINDINGS: The lungs remain mildly hyperinflated. The lung markings are coarse over the lower thoracic spine which likely lies in the left lower lobe. The heart and pulmonary vascularity are normal. The mediastinum is normal in width. The trachea is midline.  There is no pleural effusion. The bony thorax exhibits no acute abnormality. IMPRESSION: Increased density in the left lower lobe compatible with atelectasis or pneumonia. No CHF. Followup PA and lateral chest X-ray is recommended in 3-4 weeks following trial of antibiotic therapy to ensure resolution. Electronically Signed   By: David  Martinique M.D.   On: 11/17/2016 10:47      Current Discharge Medication List    START taking these medications   Details  levofloxacin (LEVAQUIN) 750 MG tablet Take 1 tablet (750 mg total) by mouth daily. Qty: 5 tablet, Refills: 0      CONTINUE these medications which have NOT CHANGED   Details  Cholecalciferol (VITAMIN D3) 2000 UNITS TABS Take 2,000 units of lipase by mouth as needed.     diazepam (VALIUM) 5 MG tablet Take 5 mg by mouth every 12 (twelve) hours as needed.     DiphenhydrAMINE HCl, Sleep, 50 MG CAPS Take 50 mg by mouth at  bedtime as needed.     estradiol (ESTRACE) 1 MG tablet Take 1 tablet (1 mg total) by mouth daily. Qty: 30 tablet, Refills: 11   Associated Diagnoses: Menopausal symptom    fluticasone (FLONASE) 50 MCG/ACT nasal spray Place 1 spray into the nose daily as needed.     omeprazole (PRILOSEC) 20 MG capsule Take 1 capsule by mouth two times daily with food Qty: 180 capsule, Refills: 3    sertraline (ZOLOFT) 100 MG tablet Take 2 tablets (200 mg total) by mouth daily. Qty: 180 tablet, Refills: 1   Associated Diagnoses: Anxiety, generalized    HYDROcodone-acetaminophen (NORCO) 5-325 MG tablet Take 1-2 tablets by mouth every 4 (four) hours as needed for moderate pain. Qty: 15 tablet, Refills: 0         A Management plans discussed with the patient and she is in agreement. Stable for discharge home  Patient should follow up with pcp  CODE STATUS:     Code Status Orders        Start     Ordered   11/17/16 1802  Full code  Continuous     11/17/16 1801    Code Status History    Date Active Date Inactive Code  Status Order ID Comments User Context   This patient has a current code status but no historical code status.    Advance Directive Documentation   Flowsheet Row Most Recent Value  Type of Advance Directive  Healthcare Power of Attorney, Living will  Pre-existing out of facility DNR order (yellow form or pink MOST form)  No data  "MOST" Form in Place?  No data      TOTAL TIME TAKING CARE OF THIS PATIENT: 37 minutes.    Note: This dictation was prepared with Dragon dictation along with smaller phrase technology. Any transcriptional errors that result from this process are unintentional.  Jameire Kouba M.D on 11/18/2016 at 11:46 AM  Between 7am to 6pm - Pager - [phone] After 6pm go to [email or web address] - password [email or web address]  Office  [phone]  CC: Primary care physician; [name], MD

## 2016-11-18 NOTE — Telephone Encounter (Signed)
[facility] called to schedule hospital f/u and stated pt was being discharged today and was treated for Sepsis. Appointment is scheduled for Thursday 11/24/16. Thanks TNP

## 2016-11-18 NOTE — Care Management Note (Signed)
Case Management Note  Patient Details  Name: Lisa Stein MRN: [id number] Date of Birth: 1961-01-10  Subjective/Objective:                  Spoke briefly with patient but patient asked that I return due to a visitor. She is not on home O2. I do not see a diagnosis currently for chronic/home O2 coverage. RNCM will continue to follow.   Action/Plan:  RNCM will continue to follow.    Expected Discharge Date:                  Expected Discharge Plan:     In-House Referral:     Discharge planning Services  CM Consult  Post Acute Care Choice:  Durable Medical Equipment Choice offered to:  Patient  DME Arranged:  Oxygen DME Agency:     HH Arranged:    [place] Agency:     Status of Service:  In process, will continue to follow  If discussed at Long Length of Stay Meetings, dates discussed:    Additional Comments:  Marshell Garfinkel, RN 11/18/2016, 8:57 AM

## 2016-11-18 NOTE — Consult Note (Signed)
Pharmacy Antibiotic Note  Lisa Stein is a 56 y.o. female admitted on 11/17/2016 with pneumonia.  Pharmacy has been consulted for ceftriaxone dosing. Pt also ordered azithromycin. Received 1 dose of levofloxacin in the ED  Plan: Continue Ceftriaxone 1 gram IV Q24h (note that UA may indicate UTI also)    Height: 6' (182.9 cm) Weight: 279 lb 3.2 oz (126.6 kg) IBW/kg (Calculated) : 73.1  Temp (24hrs), Avg:99.1 F (37.3 C), Min:97.6 F (36.4 C), Max:101.3 F (38.5 C)   Recent Labs Lab 11/17/16 1016 11/17/16 1341 11/18/16 0649  WBC 2.1*  --  7.1  CREATININE 0.96  --  1.27*  LATICACIDVEN  --  1.7  --     Estimated Creatinine Clearance: 72 mL/min (by C-G formula based on SCr of 1.27 mg/dL (H)).    Allergies  Allergen Reactions  . Benzoin     Blisters.  . Biaxin [Clarithromycin] Diarrhea    GI upset  . Sulfa Antibiotics Itching  . Tegretol  [Carbamazepine] Itching  . Betadine  [Povidone Iodine] Rash    Antimicrobials this admission: levofloxacin 2/15 >> one dose azithromycin 2/16 >>  Ceftriaxone 2/16>>  Dose adjustments this admission:   Microbiology results: 2/15 BCx:  2/15 UCX: 2/15 PCT: 6.53  Thank you for allowing pharmacy to be a part of this patient's care.  Chinita Greenland PharmD Clinical Pharmacist 11/18/2016

## 2016-11-18 NOTE — Plan of Care (Signed)
Problem: Education: Goal: Knowledge of [facility] Education information/materials will improve Outcome: Progressing VSS, free of falls during shift.  No complaints overnight.  Ambulated to bathroom, tolerated well.  Call bell within reach, [place].

## 2016-11-18 NOTE — Progress Notes (Signed)
Discharge instructions along with home medications and follow up gone over with patient. She verbalized that she understood instructions. One prescriptions given to patient. IV removed. Pt being discharged home on room air, no distress noted. Ammie Dalton, RN

## 2016-11-19 ENCOUNTER — Emergency Department: Payer: 59

## 2016-11-19 ENCOUNTER — Encounter: Payer: Self-pay | Admitting: Emergency Medicine

## 2016-11-19 ENCOUNTER — Inpatient Hospital Stay
Admission: EM | Admit: 2016-11-19 | Discharge: 2016-11-21 | DRG: 189 | Disposition: A | Discharge status: Home / Self Care | Payer: 59 | Attending: Internal Medicine | Admitting: Internal Medicine

## 2016-11-19 DIAGNOSIS — I7300 Raynaud's syndrome without gangrene: Secondary | ICD-10-CM | POA: Diagnosis present

## 2016-11-19 DIAGNOSIS — Z79899 Other long term (current) drug therapy: Secondary | ICD-10-CM

## 2016-11-19 DIAGNOSIS — Z888 Allergy status to other drugs, medicaments and biological substances status: Secondary | ICD-10-CM | POA: Diagnosis not present

## 2016-11-19 DIAGNOSIS — K219 Gastro-esophageal reflux disease without esophagitis: Secondary | ICD-10-CM | POA: Diagnosis present

## 2016-11-19 DIAGNOSIS — Z96619 Presence of unspecified artificial shoulder joint: Secondary | ICD-10-CM | POA: Diagnosis present

## 2016-11-19 DIAGNOSIS — Z90710 Acquired absence of both cervix and uterus: Secondary | ICD-10-CM | POA: Diagnosis not present

## 2016-11-19 DIAGNOSIS — F329 Major depressive disorder, single episode, unspecified: Secondary | ICD-10-CM | POA: Diagnosis present

## 2016-11-19 DIAGNOSIS — M1990 Unspecified osteoarthritis, unspecified site: Secondary | ICD-10-CM | POA: Diagnosis present

## 2016-11-19 DIAGNOSIS — G8929 Other chronic pain: Secondary | ICD-10-CM | POA: Diagnosis present

## 2016-11-19 DIAGNOSIS — M549 Dorsalgia, unspecified: Secondary | ICD-10-CM | POA: Diagnosis present

## 2016-11-19 DIAGNOSIS — F411 Generalized anxiety disorder: Secondary | ICD-10-CM | POA: Diagnosis present

## 2016-11-19 DIAGNOSIS — Z882 Allergy status to sulfonamides status: Secondary | ICD-10-CM

## 2016-11-19 DIAGNOSIS — Z87891 Personal history of nicotine dependence: Secondary | ICD-10-CM

## 2016-11-19 DIAGNOSIS — R0602 Shortness of breath: Secondary | ICD-10-CM

## 2016-11-19 DIAGNOSIS — J189 Pneumonia, unspecified organism: Secondary | ICD-10-CM | POA: Diagnosis present

## 2016-11-19 DIAGNOSIS — J9601 Acute respiratory failure with hypoxia: Secondary | ICD-10-CM | POA: Diagnosis not present

## 2016-11-19 DIAGNOSIS — Z8701 Personal history of pneumonia (recurrent): Secondary | ICD-10-CM

## 2016-11-19 DIAGNOSIS — Z96659 Presence of unspecified artificial knee joint: Secondary | ICD-10-CM | POA: Diagnosis present

## 2016-11-19 DIAGNOSIS — Z881 Allergy status to other antibiotic agents status: Secondary | ICD-10-CM | POA: Diagnosis not present

## 2016-11-19 DIAGNOSIS — Z981 Arthrodesis status: Secondary | ICD-10-CM | POA: Diagnosis not present

## 2016-11-19 DIAGNOSIS — R110 Nausea: Secondary | ICD-10-CM | POA: Diagnosis present

## 2016-11-19 LAB — URINE CULTURE: Culture: <10000 — AB

## 2016-11-19 LAB — COMPREHENSIVE METABOLIC PANEL
ALBUMIN: 3.5 g/dL (ref 3.5–5.0)
ALT: 25 U/L (ref 14–54)
ANION GAP: 8 (ref 5–15)
AST: 41 U/L (ref 15–41)
Alkaline Phosphatase: 86 U/L (ref 38–126)
BUN: 10 mg/dL (ref 6–20)
CHLORIDE: 107 mmol/L (ref 101–111)
CO2: 25 mmol/L (ref 22–32)
Calcium: 8.7 mg/dL — ABNORMAL LOW (ref 8.9–10.3)
Creatinine, Ser: 1.05 mg/dL — ABNORMAL HIGH (ref 0.44–1.00)
GFR calc non Af Amer: 57 mL/min — ABNORMAL LOW (ref >60)
Glucose, Bld: 153 mg/dL — ABNORMAL HIGH (ref 65–99)
Potassium: 4.3 mmol/L (ref 3.5–5.1)
SODIUM: 140 mmol/L (ref 135–145)
Total Bilirubin: 0.7 mg/dL (ref 0.3–1.2)
Total Protein: 7.1 g/dL (ref 6.5–8.1)

## 2016-11-19 LAB — CBC
HCT: 38.5 % (ref 35.0–47.0)
HEMOGLOBIN: 12.4 g/dL (ref 12.0–16.0)
MCH: 26.1 pg (ref 26.0–34.0)
MCHC: 32.1 g/dL (ref 32.0–36.0)
MCV: 81.2 fL (ref 80.0–100.0)
Platelets: 229 10*3/uL (ref 150–440)
RBC: 4.74 MIL/uL (ref 3.80–5.20)
RDW: 14.1 % (ref 11.5–14.5)
WBC: 8.2 10*3/uL (ref 3.6–11.0)

## 2016-11-19 LAB — LACTIC ACID, PLASMA: Lactic Acid, Venous: 1.1 mmol/L (ref 0.5–1.9)

## 2016-11-19 MED ORDER — PREDNISONE 10 MG PO TABS
10.0000 mg | ORAL_TABLET | Freq: Four times a day (QID) | ORAL | Status: DC
  Administered 2016-11-21: 10 mg via ORAL
  Filled 2016-11-19: qty 1

## 2016-11-19 MED ORDER — LEVOFLOXACIN IN D5W 750 MG/150ML IV SOLN
750.0000 mg | Freq: Once | INTRAVENOUS | Status: AC
  Administered 2016-11-19: 750 mg via INTRAVENOUS
  Filled 2016-11-19: qty 150

## 2016-11-19 MED ORDER — LEVOFLOXACIN IN D5W 750 MG/150ML IV SOLN
750.0000 mg | INTRAVENOUS | Status: DC
  Filled 2016-11-19: qty 150

## 2016-11-19 MED ORDER — PREDNISONE 1 MG PO TABS
10.0000 mg | ORAL_TABLET | ORAL | Status: AC
  Filled 2016-11-19: qty 10

## 2016-11-19 MED ORDER — ENOXAPARIN SODIUM 40 MG/0.4ML ~~LOC~~ SOLN
40.0000 mg | SUBCUTANEOUS | Status: DC
  Administered 2016-11-19 – 2016-11-20 (×2): 40 mg via SUBCUTANEOUS
  Filled 2016-11-19 (×2): qty 0.4

## 2016-11-19 MED ORDER — SODIUM CHLORIDE 0.9 % IV BOLUS (SEPSIS)
1000.0000 mL | Freq: Once | INTRAVENOUS | Status: AC
  Administered 2016-11-19: 1000 mL via INTRAVENOUS

## 2016-11-19 MED ORDER — IPRATROPIUM-ALBUTEROL 0.5-2.5 (3) MG/3ML IN SOLN
3.0000 mL | Freq: Four times a day (QID) | RESPIRATORY_TRACT | Status: DC
  Administered 2016-11-19 – 2016-11-20 (×4): 3 mL via RESPIRATORY_TRACT
  Filled 2016-11-19 (×4): qty 3

## 2016-11-19 MED ORDER — HYDROCODONE-ACETAMINOPHEN 5-325 MG PO TABS: 1.0000 | ORAL_TABLET | ORAL | Status: DC | PRN

## 2016-11-19 MED ORDER — PANTOPRAZOLE SODIUM 40 MG PO TBEC
40.0000 mg | DELAYED_RELEASE_TABLET | Freq: Every day | ORAL | Status: DC
  Administered 2016-11-20 – 2016-11-21 (×2): 40 mg via ORAL
  Filled 2016-11-19 (×2): qty 1

## 2016-11-19 MED ORDER — ALBUTEROL SULFATE (2.5 MG/3ML) 0.083% IN NEBU
5.0000 mg | INHALATION_SOLUTION | Freq: Once | RESPIRATORY_TRACT | Status: AC
  Administered 2016-11-19: 5 mg via RESPIRATORY_TRACT
  Filled 2016-11-19: qty 6

## 2016-11-19 MED ORDER — PREDNISONE 10 MG PO TABS
10.0000 mg | ORAL_TABLET | ORAL | Status: AC
  Filled 2016-11-19: qty 1

## 2016-11-19 MED ORDER — TRAZODONE HCL 50 MG PO TABS
25.0000 mg | ORAL_TABLET | Freq: Every evening | ORAL | Status: DC | PRN
  Administered 2016-11-19 – 2016-11-20 (×2): 25 mg via ORAL
  Filled 2016-11-19 (×2): qty 1

## 2016-11-19 MED ORDER — SERTRALINE HCL 100 MG PO TABS
200.0000 mg | ORAL_TABLET | Freq: Every day | ORAL | Status: DC
  Administered 2016-11-19 – 2016-11-20 (×2): 200 mg via ORAL
  Filled 2016-11-19 (×2): qty 2

## 2016-11-19 MED ORDER — ONDANSETRON HCL 4 MG PO TABS: 4.0000 mg | ORAL_TABLET | Freq: Four times a day (QID) | ORAL | Status: DC | PRN

## 2016-11-19 MED ORDER — DOCUSATE SODIUM 100 MG PO CAPS
100.0000 mg | ORAL_CAPSULE | Freq: Two times a day (BID) | ORAL | Status: DC
  Administered 2016-11-19 – 2016-11-21 (×4): 100 mg via ORAL
  Filled 2016-11-19 (×4): qty 1

## 2016-11-19 MED ORDER — ESTRADIOL 1 MG PO TABS
1.0000 mg | ORAL_TABLET | Freq: Every day | ORAL | Status: DC
  Administered 2016-11-20 – 2016-11-21 (×2): 1 mg via ORAL
  Filled 2016-11-19 (×2): qty 1

## 2016-11-19 MED ORDER — FLUTICASONE PROPIONATE 50 MCG/ACT NA SUSP
1.0000 | Freq: Every day | NASAL | Status: DC
  Administered 2016-11-20 – 2016-11-21 (×2): 1 via NASAL
  Filled 2016-11-19: qty 16

## 2016-11-19 MED ORDER — PREDNISONE 20 MG PO TABS
20.0000 mg | ORAL_TABLET | Freq: Every evening | ORAL | Status: AC
  Administered 2016-11-19: 20 mg via ORAL
  Filled 2016-11-19: qty 1

## 2016-11-19 MED ORDER — ACETAMINOPHEN 650 MG RE SUPP: 650.0000 mg | Freq: Four times a day (QID) | RECTAL | Status: DC | PRN

## 2016-11-19 MED ORDER — PREDNISONE 10 MG PO TABS
10.0000 mg | ORAL_TABLET | Freq: Three times a day (TID) | ORAL | Status: AC
  Administered 2016-11-20 (×3): 10 mg via ORAL
  Filled 2016-11-19 (×3): qty 1

## 2016-11-19 MED ORDER — BISACODYL 5 MG PO TBEC: 5.0000 mg | DELAYED_RELEASE_TABLET | Freq: Every day | ORAL | Status: DC | PRN

## 2016-11-19 MED ORDER — ONDANSETRON HCL 4 MG/2ML IJ SOLN
4.0000 mg | Freq: Once | INTRAMUSCULAR | Status: AC
  Administered 2016-11-19: 4 mg via INTRAVENOUS
  Filled 2016-11-19: qty 2

## 2016-11-19 MED ORDER — PREDNISONE 20 MG PO TABS
20.0000 mg | ORAL_TABLET | Freq: Every evening | ORAL | Status: AC
  Administered 2016-11-20: 20 mg via ORAL
  Filled 2016-11-19: qty 1

## 2016-11-19 MED ORDER — ONDANSETRON HCL 4 MG/2ML IJ SOLN: 4.0000 mg | Freq: Four times a day (QID) | INTRAMUSCULAR | Status: DC | PRN

## 2016-11-19 MED ORDER — PREDNISONE 20 MG PO TABS: 20.0000 mg | ORAL_TABLET | Freq: Every morning | ORAL | Status: AC

## 2016-11-19 MED ORDER — ACETAMINOPHEN 325 MG PO TABS
650.0000 mg | ORAL_TABLET | Freq: Four times a day (QID) | ORAL | Status: DC | PRN
  Administered 2016-11-19: 650 mg via ORAL
  Filled 2016-11-19: qty 2

## 2016-11-19 NOTE — ED Provider Notes (Signed)
[facility] Emergency Department Provider Note    First MD Initiated Contact with Patient 11/19/16 1501     (approximate)  I have reviewed the triage vital signs and the nursing notes.   HISTORY  Chief Complaint Shortness of Breath; Pneumonia; and Urinary Tract Infection    HPI Lisa Stein is a 56 y.o. female history of anxiety as well as recent admission to the hospital for community acquired pneumonia presents today with worsening shortness of breath nausea vomiting and fatigue. Patient woke up in the melena night with significant shortness of breath. Associated with nausea and was unable to tolerate her oral antibiotics. Presented to the ER due to worsening condition.   Past Medical History:  Diagnosis Date  . Anxiety   . Depression   . GERD (gastroesophageal reflux disease)   . Lymphedema   . Neuralgia   . Osteoarthritis   . Raynaud disease    Family History  Problem Relation Age of Onset  . Hypertension Mother   . Arthritis Mother   . COPD Mother   . Raynaud syndrome Mother   . Kidney failure Mother   . Heart failure Mother   . Cancer Father     lung & liver cancer  . Raynaud syndrome Sister   . Raynaud syndrome Sister    Past Surgical History:  Procedure Laterality Date  . ABDOMINAL HYSTERECTOMY  2003  . ANKLE FRACTURE SURGERY  2010  . Bunionectomy Right   . 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  . COLONOSCOPY  2010   Dr. Vira Agar  . COLONOSCOPY  08-07-14   Dr Bary Castilla  . HEMICOLECTOMY  08/08/14  . REPLACEMENT TOTAL KNEE  2009  . sinus problem  2004   Sinuses opened up  . SPINE SURGERY  08/02/2016   spinal fusion - [facility]  . TOOTH EXTRACTION Right 2016  . TOTAL SHOULDER REPLACEMENT  2008,2009   Patient Active Problem List   Diagnosis Date Noted  . Sepsis ([place]) 11/17/2016  . Skin nodule 09/05/2016  . Menopause 07/21/2016    . Clinical depression 01/12/2016  . Difficult or painful urination 03/16/2015  . Fatigue 03/16/2015  . Anxiety, generalized 03/16/2015  . Acid reflux 03/16/2015  . Cardiac murmur 03/16/2015  . H/O major abdominal surgery 03/16/2015  . Calculus of kidney 03/16/2015  . Acquired lymphedema 03/16/2015  . Affective disorder, major 03/16/2015  . Arthritis, degenerative 03/16/2015  . Adiposity 03/16/2015  . Raynaud's syndrome 03/16/2015  . Muscle rigidity 03/16/2015  . Fothergill's neuralgia 03/16/2015  . Avitaminosis D 03/16/2015  . DDD (degenerative disc disease), lumbar 11/20/2014  . Neuritis or radiculitis due to rupture of lumbar intervertebral disc 11/20/2014  . Lower abdominal pain 08/05/2014  . Diarrhea 08/05/2014      Prior to Admission medications   Medication Sig Start Date End Date Taking? Authorizing Provider  Cholecalciferol (VITAMIN D3) 2000 UNITS TABS Take 2,000 units of lipase by mouth as needed.  04/22/13   Historical Provider, MD  diazepam (VALIUM) 5 MG tablet Take 5 mg by mouth every 12 (twelve) hours as needed.  04/22/13   Historical Provider, MD  DiphenhydrAMINE HCl, Sleep, 50 MG CAPS Take 50 mg by mouth at bedtime as needed.  05/31/11   Historical Provider, MD  estradiol (ESTRACE) 1 MG tablet Take 1 tablet (1 mg total) by mouth daily. 06/24/16   Richard Maceo Pro., MD  fluticasone (  FLONASE) 50 MCG/ACT nasal spray Place 1 spray into the nose daily as needed.  12/05/13   Historical Provider, MD  HYDROcodone-acetaminophen (NORCO) 5-325 MG tablet Take 1-2 tablets by mouth every 4 (four) hours as needed for moderate pain. 11/30/15   Pierce Crane Beers, PA-C  levofloxacin (LEVAQUIN) 750 MG tablet Take 1 tablet (750 mg total) by mouth daily. 11/18/16   Gladstone Lighter, MD  omeprazole (PRILOSEC) 20 MG capsule Take 1 capsule by mouth two times daily with food 05/23/16   Jerrol Banana., MD  sertraline (ZOLOFT) 100 MG tablet Take 2 tablets (200 mg total) by mouth daily.  10/25/16   Richard Maceo Pro., MD    Allergies Benzoin; Biaxin [clarithromycin]; Sulfa antibiotics; Tegretol  [carbamazepine]; and Betadine  [povidone iodine]    Social History Social History  Substance Use Topics  . Smoking status: Former Smoker    Years: 5.00  . Smokeless tobacco: Never Used     Comment: Quit smoking in 1980's  . Alcohol use 0.6 oz/week    1 Glasses of wine per week     Comment: occasional    Review of Systems Patient denies headaches, rhinorrhea, blurry vision, numbness, shortness of breath, chest pain, edema, cough, abdominal pain, nausea, vomiting, diarrhea, dysuria, fevers, rashes or hallucinations unless otherwise stated above in HPI. ____________________________________________   PHYSICAL EXAM:  VITAL SIGNS: Vitals:   11/19/16 1215  BP: (!) 153/81  Pulse: 96  Resp: 20  Temp: 98.2 F (36.8 C)    Constitutional: Alert and oriented. ill appearing but in no acute distress. Eyes: Conjunctivae are normal. PERRL. EOMI. Head: Atraumatic. Nose: No congestion/rhinnorhea. Mouth/Throat: Mucous membranes are moist.  Oropharynx non-erythematous. Neck: No stridor. Painless ROM. No cervical spine tenderness to palpation Hematological/Lymphatic/Immunilogical: No cervical lymphadenopathy. Cardiovascular: mildly tachycardic, regular rhythm. Grossly normal heart sounds.  Good peripheral circulation. Respiratory: mildly tachypnic,  No retractions. Lungs CTAB. Bilateral inspiratory crackle Gastrointestinal: Soft and nontender. No distention. No abdominal bruits. No CVA tenderness. Genitourinary:  Musculoskeletal: No lower extremity tenderness nor edema.  No joint effusions. Neurologic:  Normal speech and language. No gross focal neurologic deficits are appreciated. No gait instability. Skin:  Skin is warm, dry and intact. No rash noted. Psychiatric: Mood and affect are normal. Speech and behavior are normal.  ____________________________________________     LABS (all labs ordered are listed, but only abnormal results are displayed)  Results for orders placed or performed during the hospital encounter of 11/19/16 (from the past 24 hour(s))  Comprehensive metabolic panel     Status: Abnormal   Collection Time: 11/19/16 12:26 PM  Result Value Ref Range   Sodium 140 135 - 145 mmol/L   Potassium 4.3 3.5 - 5.1 mmol/L   Chloride 107 101 - 111 mmol/L   CO2 25 22 - 32 mmol/L   Glucose, Bld 153 (H) 65 - 99 mg/dL   BUN 10 6 - 20 mg/dL   Creatinine, Ser 1.05 (H) 0.44 - 1.00 mg/dL   Calcium 8.7 (L) 8.9 - 10.3 mg/dL   Total Protein 7.1 6.5 - 8.1 g/dL   Albumin 3.5 3.5 - 5.0 g/dL   AST 41 15 - 41 U/L   ALT 25 14 - 54 U/L   Alkaline Phosphatase 86 38 - 126 U/L   Total Bilirubin 0.7 0.3 - 1.2 mg/dL   GFR calc non Af Amer 57 (L) >60 mL/min   GFR calc Af Amer >60 >60 mL/min   Anion gap 8 5 - 15  CBC  Status: None   Collection Time: 11/19/16 12:26 PM  Result Value Ref Range   WBC 8.2 3.6 - 11.0 K/uL   RBC 4.74 3.80 - 5.20 MIL/uL   Hemoglobin 12.4 12.0 - 16.0 g/dL   HCT 38.5 35.0 - 47.0 %   MCV 81.2 80.0 - 100.0 fL   MCH 26.1 26.0 - 34.0 pg   MCHC 32.1 32.0 - 36.0 g/dL   RDW 14.1 11.5 - 14.5 %   Platelets 229 150 - 440 K/uL   ____________________________________________  EKG My review and personal interpretation at Time: 12:37   Indication: sob  Rate: 90  Rhythm: sinus Axis: normal Other: non specific st changes,  No STEMI ____________________________________________  RADIOLOGY  I personally reviewed all radiographic images ordered to evaluate for the above acute complaints and reviewed radiology reports and findings.  These findings were personally discussed with the patient.  Please see medical record for radiology report.  ____________________________________________   PROCEDURES  Procedure(s) performed:  Procedures    Critical Care performed: no ____________________________________________   INITIAL IMPRESSION /  ASSESSMENT AND PLAN / ED COURSE  Pertinent labs & imaging results that were available during my care of the patient were reviewed by me and considered in my medical decision making (see chart for details).  DDX: Asthma, copd, CHF, pna, ptx, malignancy, Pe, anemia   SHANNETTA SEITZINGER is a 56 y.o. who presents to the ED with 3 acquired pneumonia status post recent discharge from this facility after being treated for sepsis presents afebrile mildly tachycardic with acute respiratory failure with hypoxia. I do believe this is secondary to her persistent kidney acquired pneumonia. She is unable to take her oral antibiotics at home. Blood work is otherwise fairly reassuring. We'll provide IV fluids for resuscitation as well as nebulizer treatments to help with her pneumonia. We'll give IV dose of Levaquin as this was a community-acquired pneumonia.  Given her acute respiratory failure with hypoxia I do feel patient will require admission to hospital for further evaluation and management.  Have discussed with the patient and available family all diagnostics and treatments performed thus far and all questions were answered to the best of my ability. The patient demonstrates understanding and agreement with plan.       ____________________________________________   FINAL CLINICAL IMPRESSION(S) / ED DIAGNOSES  Final diagnoses:  Acute respiratory failure with hypoxia (HCC)  Shortness of breath  Community acquired pneumonia, unspecified laterality      NEW MEDICATIONS STARTED DURING THIS VISIT:  New Prescriptions   No medications on file     Note:  This document was prepared using Dragon voice recognition software and may include unintentional dictation errors.    Merlyn Lot, MD 11/19/16 [phone]

## 2016-11-19 NOTE — H&P (Signed)
[facility] [place] NAME: Lisa Stein    MR#:  [id number]  DATE OF BIRTH:  01-01-1961  DATE OF ADMISSION:  11/19/2016  PRIMARY CARE PHYSICIAN: [name], MD   REQUESTING/REFERRING PHYSICIAN: Dr. Merlyn Lot  CHIEF COMPLAINT: Shortness of breath    Chief Complaint  Patient presents with  . Shortness of Breath  . Pneumonia  . Urinary Tract Infection    HISTORY OF PRESENT ILLNESS:  Lisa Stein  is a 56 y.o. female with a known history of Recent discharge from hospital yesterday after getting treatment for community-acquired pneumonia comes back the today morning because of nausea, shortness of breath, hypoxia with O2 saturations 84% on room air. Patient was discharged yesterday, she was given Levaquin for treatment of  urethritis, community-acquired pneumonia but she could not the start the treatment he had continued to have nausea since morning, shortness of breath. When she left hospital yesterday she said she felt very good. No cough, no fever. No lower extremity edema.  PAST MEDICAL HISTORY:   Past Medical History:  Diagnosis Date  . Anxiety   . Depression   . GERD (gastroesophageal reflux disease)   . Lymphedema   . Neuralgia   . Osteoarthritis   . Raynaud disease     PAST SURGICAL HISTOIRY:   Past Surgical History:  Procedure Laterality Date  . ABDOMINAL HYSTERECTOMY  2003  . ANKLE FRACTURE SURGERY  2010  . Bunionectomy Right   . 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  . COLONOSCOPY  2010   Dr. Vira Agar  . COLONOSCOPY  08-07-14   Dr Bary Castilla  . HEMICOLECTOMY  08/08/14  . REPLACEMENT TOTAL KNEE  2009  . sinus problem  2004   Sinuses opened up  . SPINE SURGERY  08/02/2016   spinal fusion - [facility]  . TOOTH EXTRACTION Right 2016  . TOTAL SHOULDER REPLACEMENT  2008,2009    SOCIAL HISTORY:   Social  History  Substance Use Topics  . Smoking status: Former Smoker    Years: 5.00  . Smokeless tobacco: Never Used     Comment: Quit smoking in 1980's  . Alcohol use 0.6 oz/week    1 Glasses of wine per week     Comment: occasional    FAMILY HISTORY:   Family History  Problem Relation Age of Onset  . Hypertension Mother   . Arthritis Mother   . COPD Mother   . Raynaud syndrome Mother   . Kidney failure Mother   . Heart failure Mother   . Cancer Father     lung & liver cancer  . Raynaud syndrome Sister   . Raynaud syndrome Sister     DRUG ALLERGIES:   Allergies  Allergen Reactions  . Benzoin     Blisters.  . Biaxin [Clarithromycin] Diarrhea    GI upset  . Sulfa Antibiotics Itching  . Tegretol  [Carbamazepine] Itching  . Betadine  [Povidone Iodine] Rash    REVIEW OF SYSTEMS:  CONSTITUTIONAL: No fever, fatigue or weakness.  EYES: No blurred or double vision.  EARS, NOSE, AND THROAT: No tinnitus or ear pain.  RESPIRATORY: No cough, Shortness of breath today morning. No wheezing, no hemodialysis.  CARDIOVASCULAR: No chest pain, orthopnea, edema.  GASTROINTESTINAL: Nausea, dry heaving today.no  vomiting, diarrhea or abdominal pain.  GENITOURINARY: No dysuria, hematuria.  ENDOCRINE: No polyuria, nocturia,  HEMATOLOGY:  No anemia, easy bruising or bleeding SKIN: No rash or lesion. MUSCULOSKELETAL: No joint pain or arthritis.   NEUROLOGIC: No tingling, numbness, weakness.  PSYCHIATRY: No anxiety or depression.   MEDICATIONS AT HOME:   Prior to Admission medications   Medication Sig Start Date End Date Taking? Authorizing Provider  Cholecalciferol (VITAMIN D3) 2000 UNITS TABS Take 2,000 Units by mouth as needed.  04/22/13  Yes Historical Provider, MD  diazepam (VALIUM) 5 MG tablet Take 5 mg by mouth every 12 (twelve) hours as needed.  04/22/13  Yes Historical Provider, MD  DiphenhydrAMINE HCl, Sleep, 50 MG CAPS Take 50 mg by mouth at bedtime as needed.  05/31/11  Yes  Historical Provider, MD  estradiol (ESTRACE) 1 MG tablet Take 1 tablet (1 mg total) by mouth daily. 06/24/16  Yes Richard Maceo Pro., MD  fluticasone [facility]) 50 MCG/ACT nasal spray Place 1 spray into the nose daily as needed.  12/05/13  Yes Historical Provider, MD  HYDROcodone-acetaminophen (NORCO) 5-325 MG tablet Take 1-2 tablets by mouth every 4 (four) hours as needed for moderate pain. 11/30/15  Yes Pierce Crane Beers, PA-C  omeprazole (PRILOSEC) 20 MG capsule Take 1 capsule by mouth two times daily with food 05/23/16  Yes Richard Maceo Pro., MD  sertraline (ZOLOFT) 100 MG tablet Take 2 tablets (200 mg total) by mouth daily. 10/25/16  Yes Richard Maceo Pro., MD  levofloxacin (LEVAQUIN) 750 MG tablet Take 1 tablet (750 mg total) by mouth daily. Patient not taking: Reported on 11/19/2016 11/18/16   Gladstone Lighter, MD      VITAL SIGNS:  Blood pressure (!) 142/85, pulse 100, temperature 98.2 F (36.8 C), temperature source Oral, resp. rate 20, height 6' (1.829 m), weight 127 kg (280 lb), SpO2 98 %.  PHYSICAL EXAMINATION:  GENERAL:  56 y.o.-year-old patient lying in the bed with no acute distress.  EYES: Pupils equal, round, reactive to light and accommodation. No scleral icterus. Extraocular muscles intact.  HEENT: Head atraumatic, normocephalic. Oropharynx and nasopharynx clear.  NECK:  Supple, no jugular venous distention. No thyroid enlargement, no tenderness.  LUNGS:. No use of accessory muscles of respiration. Mild tachypnea, no retraction, clear to auscultation bilaterally, bilateral inspiratory crackles. CARDIOVASCULAR: S1, S2 normal. No murmurs, rubs, or gallops.  ABDOMEN: Soft, nontender, nondistended. Bowel sounds present. No organomegaly or mass.  EXTREMITIES: No pedal edema, cyanosis, or clubbing.  NEUROLOGIC: Cranial nerves II through XII are intact. Muscle strength 5/5 in all extremities. Sensation intact. Gait not checked.  PSYCHIATRIC: The patient is alert and oriented x 3.   SKIN: No obvious rash, lesion, or ulcer.   LABORATORY PANEL:   CBC  Recent Labs Lab 11/19/16 1226  WBC 8.2  HGB 12.4  HCT 38.5  PLT 229   ------------------------------------------------------------------------------------------------------------------  Chemistries   Recent Labs Lab 11/19/16 1226  NA 140  K 4.3  CL 107  CO2 25  GLUCOSE 153*  BUN 10  CREATININE 1.05*  CALCIUM 8.7*  AST 41  ALT 25  ALKPHOS 86  BILITOT 0.7   ------------------------------------------------------------------------------------------------------------------  Cardiac Enzymes  Recent Labs Lab 11/17/16 1016  TROPONINI <0.03   ------------------------------------------------------------------------------------------------------------------  RADIOLOGY:  Dg Chest 2 View  Result Date: 11/19/2016 CLINICAL DATA:  Patient with history of pneumonia, urinary tract infection and sepsis. EXAM: CHEST  2 VIEW COMPARISON:  Chest radiograph 11/17/2016. FINDINGS: Normal cardiac and mediastinal contours. Persistent heterogeneous opacities left lung base. Probable small left pleural effusion. No pneumothorax. Biapical pleuroparenchymal thickening. Postsurgical changes left humerus. Thoracic  spine degenerative changes. IMPRESSION: Heterogeneous opacities left lung base may represent atelectasis or infection. Followup PA and lateral chest X-ray is recommended in 3-4 weeks following trial of antibiotic therapy to ensure resolution and exclude underlying malignancy. Probable small left pleural effusion. Electronically Signed   By: Lovey Newcomer M.D.   On: 11/19/2016 13:30    EKG:   Orders placed or performed during the hospital encounter of 11/19/16  . ED EKG  . ED EKG  . EKG 12-Lead  . EKG 12-Lead  Sinus rhythm at 90 bpm,  no ST-T changes,  IMPRESSION AND PLAN:  #56 year old female patient with acute respiratory failure with hypoxia secondary to partially treated pneumonia; continue IV Levaquin,  oxygen, nebulizers, steroid taper and evaluate for home oxygen need.  #2. depression: Continue Zoloft #3 . Osteoarthritis: Continue home medication with norco.  #4 nausea: Continue Zofran. #5 GERD continue PPIs. All the records are reviewed and case discussed with ED provider. Management plans discussed with the patient, family and they are in agreement.  CODE STATUS:full  TOTAL TIME TAKING CARE OF THIS PATIENT: 74minutes.    Epifanio Lesches M.D on 11/19/2016 at 4:29 PM  Between 7am to 6pm - Pager - [phone]  After 6pm go to [email or web address] - password [email or web address] [facility]  Office  [phone]  CC: Primary care physician; [name], MD  Note: This dictation was prepared with Dragon dictation along with smaller phrase technology. Any transcriptional errors that result from this process are unintentional.

## 2016-11-19 NOTE — ED Notes (Signed)
Pt was satting 87% r/a. Placed on 02

## 2016-11-19 NOTE — Progress Notes (Signed)
Pharmacy Antibiotic Note  Lisa Stein is a 56 y.o. female admitted on 11/19/2016 with pneumonia.  Pharmacy has been consulted for levofloxacin dosing.  Plan: Levofloxacin 750mg  IV x1 given in the ED. Will order levofloxacin 750mg  IV daily to start tomorrow.   Height: 6' (182.9 cm) Weight: 280 lb (127 kg) IBW/kg (Calculated) : 73.1  Temp (24hrs), Avg:98.2 F (36.8 C), Min:98.2 F (36.8 C), Max:98.2 F (36.8 C)   Recent Labs Lab 11/17/16 1016 11/17/16 1341 11/18/16 0649 11/19/16 1226  WBC 2.1*  --  7.1 8.2  CREATININE 0.96  --  1.27* 1.05*  LATICACIDVEN  --  1.7  --   --     Estimated Creatinine Clearance: 87.3 mL/min (by C-G formula based on SCr of 1.05 mg/dL (H)).    Allergies  Allergen Reactions  . Benzoin     Blisters.  . Biaxin [Clarithromycin] Diarrhea    GI upset  . Sulfa Antibiotics Itching  . Tegretol  [Carbamazepine] Itching  . Betadine  [Povidone Iodine] Rash    Antimicrobials this admission: Levofloxacin 2/17 >>  Microbiology results: 2/15 BCx: NGTD 2/15 UCx: insufficient growth     Thank you for allowing pharmacy to be a part of this patient's care.  Loree Fee, PharmD 11/19/2016 4:34 PM

## 2016-11-19 NOTE — ED Triage Notes (Signed)
Pt reports she was discharged from hospital yesterday after staying one night for PNA, UTI and sepsis. Pt states she woke up around 3am with labored breathing and dry heaves. Pt states she has not been able to take any of her prescribed atbx since discharging home due to dry heaves.

## 2016-11-20 LAB — BASIC METABOLIC PANEL
Anion gap: 8 (ref 5–15)
BUN: 10 mg/dL (ref 6–20)
CO2: 26 mmol/L (ref 22–32)
CREATININE: 0.95 mg/dL (ref 0.44–1.00)
Calcium: 8.5 mg/dL — ABNORMAL LOW (ref 8.9–10.3)
Chloride: 107 mmol/L (ref 101–111)
GFR calc non Af Amer: >60 mL/min (ref >60)
GLUCOSE: 153 mg/dL — AB (ref 65–99)
POTASSIUM: 4.2 mmol/L (ref 3.5–5.1)
Sodium: 141 mmol/L (ref 135–145)

## 2016-11-20 LAB — GLUCOSE, CAPILLARY: Glucose-Capillary: 123 mg/dL — ABNORMAL HIGH (ref 65–99)

## 2016-11-20 LAB — CBC
HEMATOCRIT: 34.1 % — AB (ref 35.0–47.0)
Hemoglobin: 11.3 g/dL — ABNORMAL LOW (ref 12.0–16.0)
MCH: 26.5 pg (ref 26.0–34.0)
MCHC: 33.3 g/dL (ref 32.0–36.0)
MCV: 79.5 fL — AB (ref 80.0–100.0)
PLATELETS: 167 10*3/uL (ref 150–440)
RBC: 4.28 MIL/uL (ref 3.80–5.20)
RDW: 14.2 % (ref 11.5–14.5)
WBC: 5.3 10*3/uL (ref 3.6–11.0)

## 2016-11-20 MED ORDER — DIAZEPAM 5 MG PO TABS: 5.0000 mg | ORAL_TABLET | Freq: Two times a day (BID) | ORAL | Status: DC | PRN

## 2016-11-20 MED ORDER — LEVOFLOXACIN 500 MG PO TABS
750.0000 mg | ORAL_TABLET | ORAL | Status: DC
  Administered 2016-11-20: 750 mg via ORAL
  Filled 2016-11-20: qty 2

## 2016-11-20 NOTE — Progress Notes (Signed)
Lisa Stein at [place] NAME: Lisa Stein    MR#:  [id number]  DATE OF BIRTH:  April 09, 1961  SUBJECTIVE:    patient was Nauseous and unable to take medicine yesterday so she came to the ER and was having shortness of breath. She was doing well after discharge  REVIEW OF SYSTEMS:    Review of Systems  Constitutional: Negative.  Negative for chills, fever and malaise/fatigue.  HENT: Negative.  Negative for ear discharge, ear pain, hearing loss, nosebleeds and sore throat.   Eyes: Negative.  Negative for blurred vision and pain.  Respiratory: Negative.  Negative for cough, hemoptysis, shortness of breath and wheezing.   Cardiovascular: Negative.  Negative for chest pain, palpitations and leg swelling.  Gastrointestinal: Negative.  Negative for abdominal pain, blood in stool, diarrhea, nausea and vomiting.  Genitourinary: Negative.  Negative for dysuria.  Musculoskeletal: Negative.  Negative for back pain.  Skin: Negative.   Neurological: Negative for dizziness, tremors, speech change, focal weakness, seizures and headaches.  Endo/Heme/Allergies: Negative.  Does not bruise/bleed easily.  Psychiatric/Behavioral: Negative.  Negative for depression, hallucinations and suicidal ideas.    Tolerating Diet: yes      DRUG ALLERGIES:   Allergies  Allergen Reactions  . Benzoin     Blisters.  . Biaxin [Clarithromycin] Diarrhea    GI upset  . Sulfa Antibiotics Itching  . Tegretol  [Carbamazepine] Itching  . Betadine  [Povidone Iodine] Rash    VITALS:  Blood pressure (!) 120/56, pulse (!) 50, temperature 97.6 F (36.4 C), temperature source Oral, resp. rate 18, height 6' (1.829 m), weight 128 kg (282 lb 3 oz), SpO2 99 %.  PHYSICAL EXAMINATION:   Physical Exam  Constitutional: She is oriented to person, place, and time and well-developed, well-nourished, and in no distress. No distress.  HENT:  Head: Normocephalic.  Eyes: No scleral  icterus.  Neck: Normal range of motion. Neck supple. No JVD present. No tracheal deviation present.  Cardiovascular: Normal rate, regular rhythm and normal heart sounds.  Exam reveals no gallop and no friction rub.   No murmur heard. Pulmonary/Chest: Effort normal and breath sounds normal. No respiratory distress. She has no wheezes. She has no rales. She exhibits no tenderness.  Abdominal: Soft. Bowel sounds are normal. She exhibits no distension and no mass. There is no tenderness. There is no rebound and no guarding.  Musculoskeletal: Normal range of motion. She exhibits no edema.  Neurological: She is alert and oriented to person, place, and time.  Skin: Skin is warm. No rash noted. No erythema.  Psychiatric: Affect and judgment normal.      LABORATORY PANEL:   CBC  Recent Labs Lab 11/20/16 0358  WBC 5.3  HGB 11.3*  HCT 34.1*  PLT 167   ------------------------------------------------------------------------------------------------------------------  Chemistries   Recent Labs Lab 11/19/16 1226 11/20/16 0358  NA 140 141  K 4.3 4.2  CL 107 107  CO2 25 26  GLUCOSE 153* 153*  BUN 10 10  CREATININE 1.05* 0.95  CALCIUM 8.7* 8.5*  AST 41  --   ALT 25  --   ALKPHOS 86  --   BILITOT 0.7  --    ------------------------------------------------------------------------------------------------------------------  Cardiac Enzymes  Recent Labs Lab 11/17/16 1016  TROPONINI <0.03   ------------------------------------------------------------------------------------------------------------------  RADIOLOGY:  Dg Chest 2 View  Result Date: 11/19/2016 CLINICAL DATA:  Patient with history of pneumonia, urinary tract infection and sepsis. EXAM: CHEST  2 VIEW COMPARISON:  Chest radiograph 11/17/2016.  FINDINGS: Normal cardiac and mediastinal contours. Persistent heterogeneous opacities left lung base. Probable small left pleural effusion. No pneumothorax. Biapical  pleuroparenchymal thickening. Postsurgical changes left humerus. Thoracic spine degenerative changes. IMPRESSION: Heterogeneous opacities left lung base may represent atelectasis or infection. Followup PA and lateral chest X-ray is recommended in 3-4 weeks following trial of antibiotic therapy to ensure resolution and exclude underlying malignancy. Probable small left pleural effusion. Electronically Signed   By: Lovey Newcomer M.D.   On: 11/19/2016 13:30     ASSESSMENT AND PLAN:    56 year old female with history of osteoarthritis just discharged with CAP/sepsis comes back to ER due to nausea and SOB.  1. Acute respiratory failure with hypoxia: Continue Levaquin for community-acquired pneumonia Wean oxygen Steroid dose taper 2. Community-acquired pneumonia: Continue Levaquin But cultures are negative to date.  3. Back pain, chronic: Patient will continue home medications  4. GERD: Continue PPI      Management plans discussed with the patient and she is in agreement.  CODE STATUS: full  TOTAL TIME TAKING CARE OF THIS PATIENT: 30 minutes.     POSSIBLE D/C tomorrow, DEPENDING ON CLINICAL CONDITION.   Symphany Fleissner M.D on 11/20/2016 at 10:28 AM  Between 7am to 6pm - Pager - [phone] After 6pm go to [email or web address] - password [email or web address]  Office  [phone]  CC: Primary care physician; [name], MD  Note: This dictation was prepared with Dragon dictation along with smaller phrase technology. Any transcriptional errors that result from this process are unintentional.

## 2016-11-20 NOTE — Progress Notes (Signed)
Pharmacy Antibiotic Note  Lisa Stein is a 56 y.o. female admitted on 11/19/2016 with pneumonia.  Pharmacy has been consulted for levofloxacin dosing.  Plan: Continue levofloxacin 750mg  PO Q24hr.    Height: 6' (182.9 cm) Weight: 282 lb 3 oz (128 kg) IBW/kg (Calculated) : 73.1  Temp (24hrs), Avg:98.3 F (36.8 C), Min:97.6 F (36.4 C), Max:100 F (37.8 C)   Recent Labs Lab 11/17/16 1016 11/17/16 1341 11/18/16 0649 11/19/16 1226 11/19/16 1554 11/20/16 0358  WBC 2.1*  --  7.1 8.2  --  5.3  CREATININE 0.96  --  1.27* 1.05*  --  0.95  LATICACIDVEN  --  1.7  --   --  1.1  --     Estimated Creatinine Clearance: 96.9 mL/min (by C-G formula based on SCr of 0.95 mg/dL).    Allergies  Allergen Reactions  . Benzoin     Blisters.  . Biaxin [Clarithromycin] Diarrhea    GI upset  . Sulfa Antibiotics Itching  . Tegretol  [Carbamazepine] Itching  . Betadine  [Povidone Iodine] Rash    Antimicrobials this admission: Levofloxacin 2/17 >>  Microbiology results: 2/15 BCx: NGTD 2/15 UCx: insufficient growth   2/15 Influenza A/B: negative  Pharmacy will continue to monitor and adjust per consult.  Stein,Lisa L 11/20/2016 8:33 AM

## 2016-11-20 NOTE — Progress Notes (Signed)
Pt doing well, no complaints of pain taking meds well and breathing treatments.

## 2016-11-21 LAB — HIV ANTIBODY (ROUTINE TESTING W REFLEX): HIV Screen 4th Generation wRfx: NONREACTIVE

## 2016-11-21 LAB — GLUCOSE, CAPILLARY: Glucose-Capillary: 145 mg/dL — ABNORMAL HIGH (ref 65–99)

## 2016-11-21 MED ORDER — PREDNISONE 20 MG PO TABS: 40.0000 mg | ORAL_TABLET | Freq: Every day | ORAL | 0 refills | Status: AC

## 2016-11-21 MED ORDER — IPRATROPIUM-ALBUTEROL 0.5-2.5 (3) MG/3ML IN SOLN: 3.0000 mL | Freq: Four times a day (QID) | RESPIRATORY_TRACT | Status: DC | PRN

## 2016-11-21 MED ORDER — IPRATROPIUM-ALBUTEROL 0.5-2.5 (3) MG/3ML IN SOLN
3.0000 mL | Freq: Three times a day (TID) | RESPIRATORY_TRACT | Status: DC
  Administered 2016-11-21: 3 mL via RESPIRATORY_TRACT
  Filled 2016-11-21: qty 3

## 2016-11-21 NOTE — Progress Notes (Signed)
Patient is on room air, O2 sats remained 95-96% after ambulation.

## 2016-11-21 NOTE — Telephone Encounter (Signed)
Patient went back in to the hospital and is been discharged today. Please call. Thank you-aa

## 2016-11-21 NOTE — Discharge Summary (Signed)
[facility] [place] NAME: Lisa Stein    MR#:  [id number]  DATE OF BIRTH:  1961/08/14  DATE OF ADMISSION:  11/19/2016 ADMITTING PHYSICIAN: Epifanio Lesches, MD  DATE OF DISCHARGE: 11/21/2016  PRIMARY CARE PHYSICIAN: [name], MD    ADMISSION DIAGNOSIS:  Shortness of breath [R06.02] Acute respiratory failure with hypoxia ([place]) [J96.01] Community acquired pneumonia, unspecified laterality [J18.9]  DISCHARGE DIAGNOSIS:  Active Problems:   Acute respiratory failure with hypoxemia ([place])   SECONDARY DIAGNOSIS:   Past Medical History:  Diagnosis Date  . Anxiety   . Depression   . GERD (gastroesophageal reflux disease)   . Lymphedema   . Neuralgia   . Osteoarthritis   . Raynaud disease     HOSPITAL COURSE:   56 year old female with history of osteoarthritis just discharged with CAP/sepsis comes back to ER due to nausea and SOB.  1. Acute respiratory failure with hypoxia: She will  continue Levaquin for community-acquired pneumonia. Oxygen has been weaned to room air.  Continue Prednisone 40 mg for 3 more days then stop.  2. Community-acquired pneumonia: Continue Levaquin.  3. Back pain, chronic: Patient will continue home medications  4.GERD: Continue PPI    DISCHARGE CONDITIONS AND DIET:   Stable regular  CONSULTS OBTAINED:    DRUG ALLERGIES:   Allergies  Allergen Reactions  . Benzoin     Blisters.  . Biaxin [Clarithromycin] Diarrhea    GI upset  . Sulfa Antibiotics Itching  . Tegretol  [Carbamazepine] Itching  . Betadine  [Povidone Iodine] Rash    DISCHARGE MEDICATIONS:   Current Discharge Medication List    START taking these medications   Details  predniSONE (DELTASONE) 20 MG tablet Take 2 tablets (40 mg total) by mouth daily with breakfast. Qty: 6 tablet, Refills: 0      CONTINUE these medications which have NOT CHANGED   Details  Cholecalciferol (VITAMIN D3) 2000 UNITS TABS  Take 2,000 Units by mouth as needed.     diazepam (VALIUM) 5 MG tablet Take 5 mg by mouth every 12 (twelve) hours as needed.     DiphenhydrAMINE HCl, Sleep, 50 MG CAPS Take 50 mg by mouth at bedtime as needed.     estradiol (ESTRACE) 1 MG tablet Take 1 tablet (1 mg total) by mouth daily. Qty: 30 tablet, Refills: 11   Associated Diagnoses: Menopausal symptom    fluticasone (FLONASE) 50 MCG/ACT nasal spray Place 1 spray into the nose daily as needed.     HYDROcodone-acetaminophen (NORCO) 5-325 MG tablet Take 1-2 tablets by mouth every 4 (four) hours as needed for moderate pain. Qty: 15 tablet, Refills: 0    omeprazole (PRILOSEC) 20 MG capsule Take 1 capsule by mouth two times daily with food Qty: 180 capsule, Refills: 3    sertraline (ZOLOFT) 100 MG tablet Take 2 tablets (200 mg total) by mouth daily. Qty: 180 tablet, Refills: 1   Associated Diagnoses: Anxiety, generalized    levofloxacin (LEVAQUIN) 750 MG tablet Take 1 tablet (750 mg total) by mouth daily. Qty: 5 tablet, Refills: 0          Today   CHIEF COMPLAINT:  Doing well did nto sleep well last night too many thoughts in her mind   VITAL SIGNS:  Blood pressure (!) 154/85, pulse (!) 55, temperature 97.9 F (36.6 C), temperature source Oral, resp. rate 16, height 6' (1.829 m), weight 129 kg (284 lb 6.3 oz), SpO2 96 %.   REVIEW  OF SYSTEMS:  Review of Systems  Constitutional: Negative.  Negative for chills, fever and malaise/fatigue.  HENT: Negative.  Negative for ear discharge, ear pain, hearing loss, nosebleeds and sore throat.   Eyes: Negative.  Negative for blurred vision and pain.  Respiratory: Positive for shortness of breath (overall improved). Negative for cough, hemoptysis and wheezing.   Cardiovascular: Negative.  Negative for chest pain, palpitations and leg swelling.  Gastrointestinal: Negative.  Negative for abdominal pain, blood in stool, diarrhea, nausea and vomiting.  Genitourinary: Negative.   Negative for dysuria.  Musculoskeletal: Negative.  Negative for back pain.  Skin: Negative.   Neurological: Negative for dizziness, tremors, speech change, focal weakness, seizures and headaches.  Endo/Heme/Allergies: Negative.  Does not bruise/bleed easily.  Psychiatric/Behavioral: Negative.  Negative for depression, hallucinations and suicidal ideas.     PHYSICAL EXAMINATION:  GENERAL:  56 y.o.-year-old patient lying in the bed with no acute distress.  NECK:  Supple, no jugular venous distention. No thyroid enlargement, no tenderness.  LUNGS: Normal breath sounds bilaterally, no wheezing, rales,rhonchi  No use of accessory muscles of respiration.  CARDIOVASCULAR: S1, S2 normal. No murmurs, rubs, or gallops.  ABDOMEN: Soft, non-tender, non-distended. Bowel sounds present. No organomegaly or mass.  EXTREMITIES: No pedal edema, cyanosis, or clubbing.  PSYCHIATRIC: The patient is alert and oriented x 3.  SKIN: No obvious rash, lesion, or ulcer.   DATA REVIEW:   CBC  Recent Labs Lab 11/20/16 0358  WBC 5.3  HGB 11.3*  HCT 34.1*  PLT 167    Chemistries   Recent Labs Lab 11/19/16 1226 11/20/16 0358  NA 140 141  K 4.3 4.2  CL 107 107  CO2 25 26  GLUCOSE 153* 153*  BUN 10 10  CREATININE 1.05* 0.95  CALCIUM 8.7* 8.5*  AST 41  --   ALT 25  --   ALKPHOS 86  --   BILITOT 0.7  --     Cardiac Enzymes  Recent Labs Lab 11/17/16 1016  TROPONINI <0.03    Microbiology Results  @[email or web address] @  RADIOLOGY:  Dg Chest 2 View  Result Date: 11/19/2016 CLINICAL DATA:  Patient with history of pneumonia, urinary tract infection and sepsis. EXAM: CHEST  2 VIEW COMPARISON:  Chest radiograph 11/17/2016. FINDINGS: Normal cardiac and mediastinal contours. Persistent heterogeneous opacities left lung base. Probable small left pleural effusion. No pneumothorax. Biapical pleuroparenchymal thickening. Postsurgical changes left humerus. Thoracic spine degenerative changes. IMPRESSION:  Heterogeneous opacities left lung base may represent atelectasis or infection. Followup PA and lateral chest X-ray is recommended in 3-4 weeks following trial of antibiotic therapy to ensure resolution and exclude underlying malignancy. Probable small left pleural effusion. Electronically Signed   By: Lovey Newcomer M.D.   On: 11/19/2016 13:30      Current Discharge Medication List    START taking these medications   Details  predniSONE (DELTASONE) 20 MG tablet Take 2 tablets (40 mg total) by mouth daily with breakfast. Qty: 6 tablet, Refills: 0      CONTINUE these medications which have NOT CHANGED   Details  Cholecalciferol (VITAMIN D3) 2000 UNITS TABS Take 2,000 Units by mouth as needed.     diazepam (VALIUM) 5 MG tablet Take 5 mg by mouth every 12 (twelve) hours as needed.     DiphenhydrAMINE HCl, Sleep, 50 MG CAPS Take 50 mg by mouth at bedtime as needed.     estradiol (ESTRACE) 1 MG tablet Take 1 tablet (1 mg total) by mouth daily. Qty: 30 tablet, Refills:  11   Associated Diagnoses: Menopausal symptom    fluticasone (FLONASE) 50 MCG/ACT nasal spray Place 1 spray into the nose daily as needed.     HYDROcodone-acetaminophen (NORCO) 5-325 MG tablet Take 1-2 tablets by mouth every 4 (four) hours as needed for moderate pain. Qty: 15 tablet, Refills: 0    omeprazole (PRILOSEC) 20 MG capsule Take 1 capsule by mouth two times daily with food Qty: 180 capsule, Refills: 3    sertraline (ZOLOFT) 100 MG tablet Take 2 tablets (200 mg total) by mouth daily. Qty: 180 tablet, Refills: 1   Associated Diagnoses: Anxiety, generalized    levofloxacin (LEVAQUIN) 750 MG tablet Take 1 tablet (750 mg total) by mouth daily. Qty: 5 tablet, Refills: 0          Management plans discussed with the patient and she is in agreement. Stable for discharge home  Patient should follow up with pcp  CODE STATUS:     Code Status Orders        Start     Ordered   11/19/16 1625  Full code   Continuous     11/19/16 1628    Code Status History    Date Active Date Inactive Code Status Order ID Comments User Context   11/17/2016  6:01 PM 11/18/2016  5:44 PM Full Code [id number]  Gladstone Lighter, MD Inpatient    Advance Directive Documentation   Flowsheet Row Most Recent Value  Type of Advance Directive  Healthcare Power of Attorney, Living will  Pre-existing out of facility DNR order (yellow form or pink MOST form)  No data  "MOST" Form in Place?  No data      TOTAL TIME TAKING CARE OF THIS PATIENT: 37 minutes.    Note: This dictation was prepared with Dragon dictation along with smaller phrase technology. Any transcriptional errors that result from this process are unintentional.  Franca Stakes M.D on 11/21/2016 at 8:14 AM  Between 7am to 6pm - Pager - [phone] After 6pm go to [email or web address] - password [email or web address]  Office  [phone]  CC: Primary care physician; [name], MD

## 2016-11-21 NOTE — Progress Notes (Signed)
Patient on room air, O2 sat 96%. No complaints of SOB. Will continue to monitor.

## 2016-11-21 NOTE — Progress Notes (Signed)
Patient is alert and oriented. No complaints of pain at this time. Discharge instructions gone over with patient. AVS printed and hard scripts given to patient. Patient verbalized understanding of all discharge instructions and made aware of all follow up appointments. No complaints of shortness of breath. Continues on room air. Daughter called and awaiting on her arrival for patient to discharge home.

## 2016-11-22 LAB — CULTURE, BLOOD (ROUTINE X 2): CULTURE: NO GROWTH

## 2016-11-22 NOTE — Telephone Encounter (Signed)
Transition Care Management Follow-up Telephone Call    Date discharged? 11/21/2016  How have you been since you were released from the hospital? Recovering but still weak.  Any patient concerns? Pt was advised at the hospital that she may need a chemical stress test and she wants to f/u with Dr. Rosanna Randy regarding this.    Items Reviewed:  Medications reviewed: Yes  Allergies reviewed: Yes  Dietary changes reviewed: N/A  Referrals reviewed: N/A   Functional Questionnaire:  Independent - I Dependent - D    Activities of Daily Living (ADLs):    Personal hygiene - I Dressing - I Eating - I Maintaining continence - I Transferring - I   Independent Activities of Daily Living (iADLs): Basic communication skills - I Transportation - I Meal preparation  - I Shopping - I Housework - I Managing medications - I  Managing personal finances - I   Confirmed importance and date/time of follow-up visits scheduled YES  Provider Appointment booked with PCP 11/24/16 @ 2:30 PM.  Confirmed with patient if condition begins to worsen call PCP or go to the ER.  Patient was given the office number and encouraged to call back with question or concerns: YES

## 2016-11-24 ENCOUNTER — Ambulatory Visit (INDEPENDENT_AMBULATORY_CARE_PROVIDER_SITE_OTHER): Payer: 59 | Admitting: Family Medicine

## 2016-11-24 ENCOUNTER — Encounter: Payer: Self-pay | Admitting: Family Medicine

## 2016-11-24 VITALS — BP 138/84 | HR 71 | Temp 98.0°F | Resp 18 | Wt 276.0 lb

## 2016-11-24 DIAGNOSIS — J181 Lobar pneumonia, unspecified organism: Secondary | ICD-10-CM

## 2016-11-24 DIAGNOSIS — R9431 Abnormal electrocardiogram [ECG] [EKG]: Secondary | ICD-10-CM

## 2016-11-24 DIAGNOSIS — J9601 Acute respiratory failure with hypoxia: Secondary | ICD-10-CM

## 2016-11-24 DIAGNOSIS — J189 Pneumonia, unspecified organism: Secondary | ICD-10-CM | POA: Insufficient documentation

## 2016-11-24 HISTORY — DX: Pneumonia, unspecified organism: J18.9

## 2016-11-24 NOTE — Progress Notes (Signed)
Patient: Lisa Stein Female    DOB: 12-19-1960   56 y.o.   MRN: [id number] Visit Date: 11/24/2016  Today's Provider: [name], MD   Chief Complaint  Patient presents with  . Hospitalization Follow-up   Subjective:    HPI     Follow up Hospitalization  Patient was admitted to [facility] on 11/17/2016 and discharged on 11/18/2016. She was treated for pneumonia, infective urethritis, sepsis. Treatment for this included oral Levaquin. Telephone follow up was done on 11/18/2016  Pt went back to ED on 11/19/2016 and was D/C on 11/21/2016.  She was treated for CAP and acute respiratory failure with hypoxia. Treatment for this included 40 mg of Prednisone x 3 days, as well as continuing Levaquin. Pt is still taking Levaquin and Prednisone. Pt reports she feels 70% improved. ------------------------------------------------------------------------------------      Allergies  Allergen Reactions  . Benzoin     Blisters.  . Biaxin [Clarithromycin] Diarrhea    GI upset  . Sulfa Antibiotics Itching  . Tegretol  [Carbamazepine] Itching  . Betadine  [Povidone Iodine] Rash     Current Outpatient Prescriptions:  .  celecoxib (CELEBREX) 200 MG capsule, Take 200 mg by mouth daily., Disp: , Rfl:  .  Cholecalciferol (VITAMIN D3) 2000 UNITS TABS, Take 2,000 Units by mouth as needed. , Disp: , Rfl:  .  diazepam (VALIUM) 5 MG tablet, Take 5 mg by mouth every 12 (twelve) hours as needed. , Disp: , Rfl:  .  DiphenhydrAMINE HCl, Sleep, 50 MG CAPS, Take 50 mg by mouth at bedtime as needed. , Disp: , Rfl:  .  estradiol (ESTRACE) 1 MG tablet, Take 1 tablet (1 mg total) by mouth daily., Disp: 30 tablet, Rfl: 11 .  fluticasone (FLONASE) 50 MCG/ACT nasal spray, Place 1 spray into the nose daily as needed. , Disp: , Rfl:  .  levofloxacin (LEVAQUIN) 750 MG tablet, Take 1 tablet (750 mg total) by mouth daily., Disp: 5 tablet, Rfl: 0 .  omeprazole (PRILOSEC) 20 MG capsule, Take 1 capsule  by mouth two times daily with food, Disp: 180 capsule, Rfl: 3 .  predniSONE (DELTASONE) 20 MG tablet, Take 2 tablets (40 mg total) by mouth daily with breakfast., Disp: 6 tablet, Rfl: 0 .  sertraline (ZOLOFT) 100 MG tablet, Take 2 tablets (200 mg total) by mouth daily., Disp: 180 tablet, Rfl: 1 .  HYDROcodone-acetaminophen (NORCO) 5-325 MG tablet, Take 1-2 tablets by mouth every 4 (four) hours as needed for moderate pain. (Patient not taking: Reported on 11/24/2016), Disp: 15 tablet, Rfl: 0  Review of Systems  Constitutional: Positive for activity change, appetite change and fatigue. Negative for chills, diaphoresis and fever.  Respiratory: Positive for shortness of breath (improving). Negative for cough, chest tightness and wheezing.   Cardiovascular: Positive for leg swelling (lymphedema). Negative for chest pain and palpitations.    Social History  Substance Use Topics  . Smoking status: Former Smoker    Years: 5.00  . Smokeless tobacco: Never Used     Comment: Quit smoking in 1980's  . Alcohol use 0.6 oz/week    1 Glasses of wine per week     Comment: occasional   Objective:   BP 138/84 (BP Location: Right Arm, Patient Position: Sitting, Cuff Size: Large)   Pulse 71   Temp 98 F (36.7 C) (Oral)   Resp 18   Wt 276 lb (125.2 kg)   SpO2 97%   BMI 37.43 kg/m  Physical Exam  Constitutional: She appears well-developed and well-nourished.  HENT:  Head: Normocephalic.  Neck: Normal range of motion. No thyromegaly present.  Cardiovascular: Normal rate, regular rhythm and normal heart sounds.   Pulmonary/Chest: Effort normal and breath sounds normal. No respiratory distress.  Abdominal: Soft. There is no tenderness.  Musculoskeletal: She exhibits edema.  Lymphadenopathy:    She has no cervical adenopathy.  Psychiatric: She has a normal mood and affect. Her behavior is normal.        Assessment & Plan:     1. Acute respiratory failure with hypoxemia (HCC) Stable. Continue  current medications and plan of care. Get plenty of rest.  2. Community acquired pneumonia of left lower lobe of lung ([place]) Recheck in 6 weeks. Will need FU CXR at that time.  3. ECG abnormality Possible stress induced EKG changes. Refer to Dr. Clayborn Bigness as below. Start ASA 81 mg po qd. - Ambulatory referral to Cardiology 4.Possible UTI Urine culture on f/u.Treated. 5.OA 6.s/p Lumbar Laminectomy    Patient seen and examined by Miguel Aschoff, MD, and note scribed by Renaldo Fiddler, CMA. I have done the exam and reviewed the above chart and it is accurate to the best of my knowledge. Development worker, community has been used in this note in any air is in the dictation or transcription are unintentional.  [name], MD  [place]

## 2016-11-28 ENCOUNTER — Inpatient Hospital Stay: Payer: Self-pay | Admitting: Family Medicine

## 2016-12-01 LAB — CULTURE, BLOOD (ROUTINE X 2): Culture: NO GROWTH

## 2016-12-19 DIAGNOSIS — M1712 Unilateral primary osteoarthritis, left knee: Secondary | ICD-10-CM | POA: Insufficient documentation

## 2017-01-09 ENCOUNTER — Encounter: Payer: Self-pay | Admitting: Family Medicine

## 2017-01-09 ENCOUNTER — Ambulatory Visit
Admission: RE | Admit: 2017-01-09 | Discharge: 2017-01-09 | Disposition: A | Discharge status: Home / Self Care | Payer: 59 | Source: Ambulatory Visit | Attending: Family Medicine | Admitting: Family Medicine

## 2017-01-09 ENCOUNTER — Ambulatory Visit (INDEPENDENT_AMBULATORY_CARE_PROVIDER_SITE_OTHER): Payer: 59 | Admitting: Family Medicine

## 2017-01-09 VITALS — BP 128/82 | HR 82 | Temp 97.7°F | Resp 16 | Wt 274.0 lb

## 2017-01-09 DIAGNOSIS — J181 Lobar pneumonia, unspecified organism: Secondary | ICD-10-CM

## 2017-01-09 DIAGNOSIS — Z8701 Personal history of pneumonia (recurrent): Secondary | ICD-10-CM | POA: Insufficient documentation

## 2017-01-09 DIAGNOSIS — J189 Pneumonia, unspecified organism: Secondary | ICD-10-CM

## 2017-01-09 DIAGNOSIS — J984 Other disorders of lung: Secondary | ICD-10-CM | POA: Insufficient documentation

## 2017-01-09 DIAGNOSIS — Z09 Encounter for follow-up examination after completed treatment for conditions other than malignant neoplasm: Secondary | ICD-10-CM | POA: Diagnosis not present

## 2017-01-09 DIAGNOSIS — Z23 Encounter for immunization: Secondary | ICD-10-CM | POA: Diagnosis not present

## 2017-01-09 NOTE — Progress Notes (Signed)
Subjective:  HPI Pneumonia- Pt is here for a 6 week follow up of pneumonia. She is due for a recheck of her chest xray to make sure the pneumonia has cleared. Pt reports that she is feeling much better and feeling back to normal. No cough or shortness of breath.   Prior to Admission medications   Medication Sig Start Date End Date Taking? Authorizing Provider  celecoxib (CELEBREX) 200 MG capsule Take 200 mg by mouth daily.    Historical Provider, MD  Cholecalciferol (VITAMIN D3) 2000 UNITS TABS Take 2,000 Units by mouth as needed.  04/22/13   Historical Provider, MD  diazepam (VALIUM) 5 MG tablet Take 5 mg by mouth every 12 (twelve) hours as needed.  04/22/13   Historical Provider, MD  DiphenhydrAMINE HCl, Sleep, 50 MG CAPS Take 50 mg by mouth at bedtime as needed.  05/31/11   Historical Provider, MD  estradiol (ESTRACE) 1 MG tablet Take 1 tablet (1 mg total) by mouth daily. 06/24/16   Suzanne Garbers Maceo Pro., MD  fluticasone (FLONASE) 50 MCG/ACT nasal spray Place 1 spray into the nose daily as needed.  12/05/13   Historical Provider, MD  HYDROcodone-acetaminophen (NORCO) 5-325 MG tablet Take 1-2 tablets by mouth every 4 (four) hours as needed for moderate pain. Patient not taking: Reported on 11/24/2016 11/30/15   Pierce Crane Beers, PA-C  levofloxacin (LEVAQUIN) 750 MG tablet Take 1 tablet (750 mg total) by mouth daily. 11/18/16   Gladstone Lighter, MD  omeprazole (PRILOSEC) 20 MG capsule Take 1 capsule by mouth two times daily with food 05/23/16   Jerrol Banana., MD  sertraline (ZOLOFT) 100 MG tablet Take 2 tablets (200 mg total) by mouth daily. 10/25/16   Tareek Sabo Maceo Pro., MD    Patient Active Problem List   Diagnosis Date Noted  . Community acquired pneumonia 11/24/2016  . Acute respiratory failure with hypoxemia ([place]) 11/19/2016  . Sepsis ([place]) 11/17/2016  . Skin nodule 09/05/2016  . Menopause 07/21/2016  . Clinical depression 01/12/2016  . Difficult or painful urination 03/16/2015   . Fatigue 03/16/2015  . Anxiety, generalized 03/16/2015  . Acid reflux 03/16/2015  . Cardiac murmur 03/16/2015  . H/O major abdominal surgery 03/16/2015  . Calculus of kidney 03/16/2015  . Acquired lymphedema 03/16/2015  . Affective disorder, major 03/16/2015  . Arthritis, degenerative 03/16/2015  . Adiposity 03/16/2015  . Raynaud's syndrome 03/16/2015  . Muscle rigidity 03/16/2015  . Fothergill's neuralgia 03/16/2015  . Avitaminosis D 03/16/2015  . DDD (degenerative disc disease), lumbar 11/20/2014  . Neuritis or radiculitis due to rupture of lumbar intervertebral disc 11/20/2014  . Lower abdominal pain 08/05/2014  . Diarrhea 08/05/2014    Past Medical History:  Diagnosis Date  . Anxiety   . Depression   . GERD (gastroesophageal reflux disease)   . Lymphedema   . Neuralgia   . Osteoarthritis   . Raynaud disease     Social History   Social History  . Marital status: Married    Spouse name: N/A  . Number of children: N/A  . Years of education: N/A   Occupational History  . Not on file.   Social History Main Topics  . Smoking status: Former Smoker    Years: 5.00  . Smokeless tobacco: Never Used     Comment: Quit smoking in 1980's  . Alcohol use 0.6 oz/week    1 Glasses of wine per week     Comment: occasional  . Drug use: No  .  Sexual activity: Not on file   Other Topics Concern  . Not on file   Social History Narrative   Lives at home with husband and family. Independent at baseline    Allergies  Allergen Reactions  . Benzoin     Blisters.  . Biaxin [Clarithromycin] Diarrhea    GI upset  . Sulfa Antibiotics Itching  . Tegretol  [Carbamazepine] Itching  . Betadine  [Povidone Iodine] Rash    Review of Systems  Constitutional: Negative.   HENT: Negative.   Eyes: Negative.   Respiratory: Negative.   Cardiovascular: Negative.   Genitourinary: Negative.   Musculoskeletal: Negative.   Skin: Negative.   Neurological: Negative.     Endo/Heme/Allergies: Negative.   Psychiatric/Behavioral: Negative.     Immunization History  Administered Date(s) Administered  . Influenza,inj,Quad PF,36+ Mos 08/01/2016  . Tdap 01/26/2006    Objective:  BP 128/82 (BP Location: Left Arm, Patient Position: Sitting, Cuff Size: Large)   Pulse 82   Temp 97.7 F (36.5 C) (Oral)   Resp 16   Wt 274 lb (124.3 kg)   SpO2 97%   BMI 37.16 kg/m   Physical Exam  Constitutional: She is oriented to person, place, and time and well-developed, well-nourished, and in no distress.  Eyes: Conjunctivae and EOM are normal. Pupils are equal, round, and reactive to light.  Neck: Normal range of motion. Neck supple.  Cardiovascular: Normal rate, regular rhythm, normal heart sounds and intact distal pulses.   Pulmonary/Chest: Effort normal and breath sounds normal.  Musculoskeletal: Normal range of motion.  Neurological: She is alert and oriented to person, place, and time. She has normal reflexes. Gait normal. GCS score is 15.  Skin: Skin is warm and dry.  Psychiatric: Mood, memory, affect and judgment normal.    Lab Results  Component Value Date   WBC 5.3 11/20/2016   HGB 11.3 (L) 11/20/2016   HCT 34.1 (L) 11/20/2016   PLT 167 11/20/2016   GLUCOSE 153 (H) 11/20/2016   TSH 1.840 07/15/2015    CMP     Component Value Date/Time   NA 141 11/20/2016 0358   NA 143 07/15/2015 0840   NA 138 04/17/2013 1025   K 4.2 11/20/2016 0358   K 3.4 (L) 04/17/2013 1025   CL 107 11/20/2016 0358   CL 105 04/17/2013 1025   CO2 26 11/20/2016 0358   CO2 28 04/17/2013 1025   GLUCOSE 153 (H) 11/20/2016 0358   GLUCOSE 141 (H) 04/17/2013 1025   BUN 10 11/20/2016 0358   BUN 20 07/15/2015 0840   BUN 19 (H) 04/17/2013 1025   CREATININE 0.95 11/20/2016 0358   CREATININE 1.75 (H) 04/17/2013 1025   CALCIUM 8.5 (L) 11/20/2016 0358   CALCIUM 8.8 04/17/2013 1025   PROT 7.1 11/19/2016 1226   PROT 6.9 07/15/2015 0840   PROT 7.0 04/17/2013 1025   ALBUMIN 3.5  11/19/2016 1226   ALBUMIN 4.6 07/15/2015 0840   ALBUMIN 3.0 (L) 04/17/2013 1025   AST 41 11/19/2016 1226   AST 27 04/17/2013 1025   ALT 25 11/19/2016 1226   ALT 30 04/17/2013 1025   ALKPHOS 86 11/19/2016 1226   ALKPHOS 148 (H) 04/17/2013 1025   BILITOT 0.7 11/19/2016 1226   BILITOT 0.3 07/15/2015 0840   BILITOT 0.6 04/17/2013 1025   GFRNONAA >60 11/20/2016 0358   GFRNONAA 32 (L) 04/17/2013 1025   GFRAA >60 11/20/2016 0358   GFRAA 38 (L) 04/17/2013 1025    Assessment and Plan :  1. Community  acquired pneumonia of left lower lobe of lung ([place]) Improved. Repeat chest xray today.  2. Need for TD vaccine  - Td : Tetanus/diphtheria >7yo Preservative  free 3.Obesity  HPI, Exam, and A&P Transcribed under the direction and in the presence of Chrisette Man L. Cranford Mon, MD  Electronically Signed: Katina Dung, CMA I have done the exam and reviewed the above chart and it is accurate to the best of my knowledge. Development worker, community has been used in this note in any air is in the dictation or transcription are unintentional.  [facility] 01/09/2017 4:08 PM

## 2017-01-10 NOTE — Progress Notes (Signed)
Advised  ED 

## 2017-01-25 ENCOUNTER — Ambulatory Visit (INDEPENDENT_AMBULATORY_CARE_PROVIDER_SITE_OTHER): Payer: 59 | Admitting: Physician Assistant

## 2017-01-25 ENCOUNTER — Encounter: Payer: Self-pay | Admitting: Physician Assistant

## 2017-01-25 VITALS — BP 122/72 | HR 72 | Temp 98.4°F | Resp 16 | Wt 275.0 lb

## 2017-01-25 DIAGNOSIS — Z124 Encounter for screening for malignant neoplasm of cervix: Secondary | ICD-10-CM | POA: Diagnosis not present

## 2017-01-25 DIAGNOSIS — L298 Other pruritus: Secondary | ICD-10-CM | POA: Diagnosis not present

## 2017-01-25 DIAGNOSIS — N898 Other specified noninflammatory disorders of vagina: Secondary | ICD-10-CM

## 2017-01-25 DIAGNOSIS — N949 Unspecified condition associated with female genital organs and menstrual cycle: Secondary | ICD-10-CM

## 2017-01-25 MED ORDER — TERCONAZOLE 0.4 % VA CREA: 1.0000 | TOPICAL_CREAM | Freq: Every day | VAGINAL | 0 refills | Status: AC

## 2017-01-25 NOTE — Patient Instructions (Addendum)
May apply thin layer of terconazole cream externally at night   Vaginal Yeast infection, Adult Vaginal yeast infection is a condition that causes soreness, swelling, and redness (inflammation) of the vagina. It also causes vaginal discharge. This is a common condition. Some women get this infection frequently. What are the causes? This condition is caused by a change in the normal balance of the yeast (candida) and bacteria that live in the vagina. This change causes an overgrowth of yeast, which causes the inflammation. What increases the risk? This condition is more likely to develop in:  Women who take antibiotic medicines.  Women who have diabetes.  Women who take birth control pills.  Women who are pregnant.  Women who douche often.  Women who have a weak defense (immune) system.  Women who have been taking steroid medicines for a long time.  Women who frequently wear tight clothing. What are the signs or symptoms? Symptoms of this condition include:  White, thick vaginal discharge.  Swelling, itching, redness, and irritation of the vagina. The lips of the vagina (vulva) may be affected as well.  Pain or a burning feeling while urinating.  Pain during sex. How is this diagnosed? This condition is diagnosed with a medical history and physical exam. This will include a pelvic exam. Your health care provider will examine a sample of your vaginal discharge under a microscope. Your health care provider may send this sample for testing to confirm the diagnosis. How is this treated? This condition is treated with medicine. Medicines may be over-the-counter or prescription. You may be told to use one or more of the following:  Medicine that is taken orally.  Medicine that is applied as a cream.  Medicine that is inserted directly into the vagina (suppository). Follow these instructions at home:  Take or apply over-the-counter and prescription medicines only as told by your  health care provider.  Do not have sex until your health care provider has approved. Tell your sex partner that you have a yeast infection. That person should go to his or her health care provider if he or she develops symptoms.  Do not wear tight clothes, such as pantyhose or tight pants.  Avoid using tampons until your health care provider approves.  Eat more yogurt. This may help to keep your yeast infection from returning.  Try taking a sitz bath to help with discomfort. This is a warm water bath that is taken while you are sitting down. The water should only come up to your hips and should cover your buttocks. Do this 3-4 times per day or as told by your health care provider.  Do not douche.  Wear breathable, cotton underwear.  If you have diabetes, keep your blood sugar levels under control. Contact a health care provider if:  You have a fever.  Your symptoms go away and then return.  Your symptoms do not get better with treatment.  Your symptoms get worse.  You have new symptoms.  You develop blisters in or around your vagina.  You have blood coming from your vagina and it is not your menstrual period.  You develop pain in your abdomen. This information is not intended to replace advice given to you by your health care provider. Make sure you discuss any questions you have with your health care provider. Document Released: 06/29/2005 Document Revised: 03/02/2016 Document Reviewed: 03/23/2015 Elsevier Interactive Patient Education  2017 [employer].

## 2017-01-25 NOTE — Progress Notes (Signed)
Patient: Lisa Stein Female    DOB: December 19, 1960   56 y.o.   MRN: [id number] Visit Date: 01/25/2017  Today's Provider: Trinna Post, PA-C   Chief Complaint  Patient presents with  . Vaginal Itching   Subjective:    Vaginal Itching  The patient's primary symptoms include genital itching, genital lesions and a genital rash. The patient's pertinent negatives include no genital odor, missed menses, pelvic pain, vaginal bleeding or vaginal discharge. This is a new problem. The current episode started in the past 7 days. The problem occurs constantly. The problem has been unchanged. The pain is moderate. The problem affects both sides. She is not pregnant. Associated symptoms include urgency. Pertinent negatives include no abdominal pain, constipation, diarrhea, dysuria, flank pain, frequency, hematuria, nausea or vomiting. She has tried antifungals for the symptoms. The treatment provided no relief. She is not sexually active (Has been several months). No, her partner does not have an STD.   She is due for a PAP today, last PAP/HPV was 01/10/2012 and was normal.   Allergies  Allergen Reactions  . Benzoin     Blisters.  . Biaxin [Clarithromycin] Diarrhea    GI upset  . Sulfa Antibiotics Itching  . Tegretol  [Carbamazepine] Itching  . Betadine  [Povidone Iodine] Rash     Current Outpatient Prescriptions:  .  celecoxib (CELEBREX) 200 MG capsule, Take 200 mg by mouth daily., Disp: , Rfl:  .  Cholecalciferol (VITAMIN D3) 2000 UNITS TABS, Take 2,000 Units by mouth as needed. , Disp: , Rfl:  .  diazepam (VALIUM) 5 MG tablet, Take 5 mg by mouth every 12 (twelve) hours as needed. , Disp: , Rfl:  .  DiphenhydrAMINE HCl, Sleep, 50 MG CAPS, Take 50 mg by mouth at bedtime as needed. , Disp: , Rfl:  .  estradiol (ESTRACE) 1 MG tablet, Take 1 tablet (1 mg total) by mouth daily., Disp: 30 tablet, Rfl: 11 .  fluticasone (FLONASE) 50 MCG/ACT nasal spray, Place 1 spray into the nose daily as  needed. , Disp: , Rfl:  .  omeprazole (PRILOSEC) 20 MG capsule, Take 1 capsule by mouth two times daily with food, Disp: 180 capsule, Rfl: 3 .  sertraline (ZOLOFT) 100 MG tablet, Take 2 tablets (200 mg total) by mouth daily., Disp: 180 tablet, Rfl: 1 .  HYDROcodone-acetaminophen (NORCO) 5-325 MG tablet, Take 1-2 tablets by mouth every 4 (four) hours as needed for moderate pain. (Patient not taking: Reported on 01/25/2017), Disp: 15 tablet, Rfl: 0 .  terconazole (TERAZOL 7) 0.4 % vaginal cream, Place 1 applicator vaginally at bedtime., Disp: 45 g, Rfl: 0  Review of Systems  Constitutional: Negative.   Gastrointestinal: Negative for abdominal distention, abdominal pain, anal bleeding, blood in stool, constipation, diarrhea, nausea, rectal pain and vomiting.  Genitourinary: Positive for genital sores, urgency and vaginal pain. Negative for decreased urine volume, difficulty urinating, dysuria, flank pain, frequency, hematuria, missed menses, pelvic pain, vaginal bleeding and vaginal discharge.    Social History  Substance Use Topics  . Smoking status: Former Smoker    Years: 5.00  . Smokeless tobacco: Never Used     Comment: Quit smoking in 1980's  . Alcohol use 0.6 oz/week    1 Glasses of wine per week     Comment: occasional   Objective:   BP 122/72 (BP Location: Left Arm, Patient Position: Sitting, Cuff Size: Large)   Pulse 72   Temp 98.4 F (36.9 C) (Oral)  Resp 16   Wt 275 lb (124.7 kg)   BMI 37.30 kg/m  Vitals:   01/25/17 1100  BP: 122/72  Pulse: 72  Resp: 16  Temp: 98.4 F (36.9 C)  TempSrc: Oral  Weight: 275 lb (124.7 kg)     Physical Exam  Constitutional: She is oriented to person, place, and time. She appears well-developed and well-nourished.  Genitourinary:    There is rash and lesion on the right labia. There is no tenderness or injury on the right labia. There is rash on the left labia. There is no tenderness, lesion or injury on the left labia. Cervix  exhibits no motion tenderness, no discharge and no friability. Vaginal discharge found.  Genitourinary Comments: There is erythema and swelling of labia majora  Neurological: She is alert and oriented to person, place, and time.  Skin: Skin is warm and dry. There is erythema.  Psychiatric: She has a normal mood and affect. Her behavior is normal.        Assessment & Plan:     1. Vaginal itching  Marked erythema of external genitalia. Will treat with terconazole externally until Nuswab comes back.  - terconazole (TERAZOL 7) 0.4 % vaginal cream; Place 1 applicator vaginally at bedtime.  Dispense: 45 g; Refill: 0 - NuSwab Vaginitis (VG)  2. Vaginal burning  There is a herpetic appearing lesion on labia minora. Patient does not want this cultured, though I suspect this may contribute to the burning.   3. Screening for cervical cancer  Due for PAP/HPV, sent in as below, will contact with results.  - Pap IG and HPV (high risk) DNA detection  Return if symptoms worsen or fail to improve.  The entirety of the information documented in the History of Present Illness, Review of Systems and Physical Exam were personally obtained by me. Portions of this information were initially documented by Ashley Royalty, CMA and reviewed by me for thoroughness and accuracy.        Trinna Post, PA-C  [facility]

## 2017-01-27 LAB — NUSWAB VAGINITIS (VG)
Candida albicans, NAA: POSITIVE — AB
Candida glabrata, NAA: NEGATIVE
Trich vag by NAA: NEGATIVE

## 2017-01-30 LAB — PAP IG AND HPV HIGH-RISK: PAP Smear Comment: 0

## 2017-01-30 LAB — HPV, LOW VOLUME (REFLEX): HPV low volume reflex: NEGATIVE

## 2017-01-31 ENCOUNTER — Telehealth: Payer: Self-pay

## 2017-01-31 NOTE — Telephone Encounter (Signed)
-----   Message from Trinna Post, [place] sent at 01/31/2017  9:06 AM EDT ----- PAP/HPV normal and negative, repeat in five years.

## 2017-01-31 NOTE — Telephone Encounter (Signed)
LMTCB 01/31/2017  Thanks,   -Mickel Baas

## 2017-01-31 NOTE — Telephone Encounter (Signed)
Pt advised.   Thanks,   -Shaquila Sigman  

## 2017-02-16 ENCOUNTER — Telehealth: Payer: Self-pay | Admitting: Family Medicine

## 2017-02-16 DIAGNOSIS — B373 Candidiasis of vulva and vagina: Secondary | ICD-10-CM

## 2017-02-16 DIAGNOSIS — B3731 Acute candidiasis of vulva and vagina: Secondary | ICD-10-CM

## 2017-02-16 MED ORDER — TERCONAZOLE 0.4 % VA CREA: 1.0000 | TOPICAL_CREAM | Freq: Every day | VAGINAL | 0 refills | Status: DC

## 2017-02-16 NOTE — Telephone Encounter (Signed)
Pt advised. Barnes Florek Drozdowski, CMA  

## 2017-02-16 NOTE — Telephone Encounter (Signed)
ADrianna patient saw you for this issue, can you please review? Thank you-aa

## 2017-02-16 NOTE — Telephone Encounter (Signed)
Pt saw you 01/25/2017 for vaginal itching, and Terazol 7 was prescribed for positive candida on NuSwab. Please advise. Renaldo Fiddler, CMA

## 2017-02-16 NOTE — Telephone Encounter (Signed)
Sent in one more week of terconazole cream.

## 2017-02-16 NOTE — Telephone Encounter (Signed)
Pt states she is still having itching in her vaginal area.  Pt states she is taking a Rx Cipro for a UTI.  Pt states she rec'd this through [facility].  [facility]  CB#[phone]/MW

## 2017-02-17 ENCOUNTER — Other Ambulatory Visit: Payer: Self-pay

## 2017-02-17 DIAGNOSIS — B373 Candidiasis of vulva and vagina: Secondary | ICD-10-CM

## 2017-02-17 DIAGNOSIS — B3731 Acute candidiasis of vulva and vagina: Secondary | ICD-10-CM

## 2017-02-17 MED ORDER — TERCONAZOLE 0.4 % VA CREA: 1.0000 | TOPICAL_CREAM | Freq: Every day | VAGINAL | 0 refills | Status: DC

## 2017-02-17 NOTE — Telephone Encounter (Signed)
Pt reports the cream was sent to [facility] by accident. [facility] [address]  Please resend.   Thanks,   -Mickel Baas

## 2017-04-08 ENCOUNTER — Other Ambulatory Visit: Payer: Self-pay | Admitting: Family Medicine

## 2017-04-08 DIAGNOSIS — F411 Generalized anxiety disorder: Secondary | ICD-10-CM

## 2017-05-02 ENCOUNTER — Other Ambulatory Visit: Payer: Self-pay | Admitting: Family Medicine

## 2017-05-02 DIAGNOSIS — N951 Menopausal and female climacteric states: Secondary | ICD-10-CM

## 2017-05-19 ENCOUNTER — Encounter: Payer: Self-pay | Admitting: Physician Assistant

## 2017-05-19 ENCOUNTER — Ambulatory Visit (INDEPENDENT_AMBULATORY_CARE_PROVIDER_SITE_OTHER): Payer: 59 | Admitting: Physician Assistant

## 2017-05-19 VITALS — BP 116/80 | HR 84 | Temp 98.3°F | Resp 16 | Wt 274.0 lb

## 2017-05-19 DIAGNOSIS — F32A Depression, unspecified: Secondary | ICD-10-CM

## 2017-05-19 DIAGNOSIS — F329 Major depressive disorder, single episode, unspecified: Secondary | ICD-10-CM | POA: Diagnosis not present

## 2017-05-19 DIAGNOSIS — F411 Generalized anxiety disorder: Secondary | ICD-10-CM | POA: Diagnosis not present

## 2017-05-19 MED ORDER — BUSPIRONE HCL 5 MG PO TABS: 5.0000 mg | ORAL_TABLET | Freq: Three times a day (TID) | ORAL | 2 refills | Status: DC

## 2017-05-19 NOTE — Patient Instructions (Signed)

## 2017-05-19 NOTE — Progress Notes (Signed)
Patient: Lisa Stein Female    DOB: 05/21/1961   56 y.o.   MRN: [id number] Visit Date: 05/19/2017  Today's Provider: Trinna Post, PA-C   Chief Complaint  Patient presents with  . Anxiety  . Fatigue   Subjective:      Lisa Stein is a 56 y/o woman with history of anxiety and depression on 200 mg of zoloft presenting today with worsening anxiety. She reports she has a lot of life stressors right. She lives with her mother in law, her husband, her 58 year old daughter and her grand daughter. She has recently been through a lengthy court battle to obtain custody over her granddaughter. 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 She is the main financial provider for her family. Her husband who has been a Theme park manager for the past ten years is leaving his church next week. She feels overwhelmed. She used to see a [place] based counselor who has since left work and she is unsure of who else to see.  Anxiety  Presents for follow-up (Pt reports her anxiety has been worsening in the last two weeks.  She has not had a panic attack yet but "feels like one is coming".  She has increased stressors at home and work. ) visit. Symptoms include decreased concentration, depressed mood (Worsening secondary to anxiety), excessive worry, nervous/anxious behavior, palpitations (Secondary to anxiety.) and restlessness. Patient reports no chest pain, confusion, dizziness, insomnia, nausea, obsessions, panic, shortness of breath or suicidal ideas. Symptoms occur constantly. The quality of sleep is fair. Nighttime awakenings: none.      Allergies  Allergen Reactions  . Benzoin     Blisters.  . Biaxin [Clarithromycin] Diarrhea    GI upset  . Sulfa Antibiotics Itching  . Tegretol  [Carbamazepine] Itching  . Betadine  [Povidone Iodine] Rash     Current Outpatient Prescriptions:  .  celecoxib (CELEBREX) 200 MG capsule, Take 200 mg by mouth daily., Disp: , Rfl:  .  Cholecalciferol  (VITAMIN D3) 2000 UNITS TABS, Take 2,000 Units by mouth as needed. , Disp: , Rfl:  .  diazepam (VALIUM) 5 MG tablet, Take 5 mg by mouth every 12 (twelve) hours as needed. , Disp: , Rfl:  .  DiphenhydrAMINE HCl, Sleep, 50 MG CAPS, Take 50 mg by mouth at bedtime as needed. , Disp: , Rfl:  .  estradiol (ESTRACE) 1 MG tablet, TAKE 1 TABLET BY MOUTH  DAILY, Disp: 90 tablet, Rfl: 3 .  fluticasone (FLONASE) 50 MCG/ACT nasal spray, Place 1 spray into the nose daily as needed. , Disp: , Rfl:  .  omeprazole (PRILOSEC) 20 MG capsule, Take 1 capsule by mouth two times daily with food, Disp: 180 capsule, Rfl: 3 .  sertraline (ZOLOFT) 100 MG tablet, TAKE 2 TABLETS BY MOUTH  DAILY, Disp: 180 tablet, Rfl: 3 .  busPIRone (BUSPAR) 5 MG tablet, Take 1 tablet (5 mg total) by mouth 3 (three) times daily., Disp: 90 tablet, Rfl: 2  Review of Systems  Constitutional: Positive for fatigue. Negative for activity change, appetite change, chills, diaphoresis, fever and unexpected weight change.  Respiratory: Negative.  Negative for shortness of breath.   Cardiovascular: Positive for palpitations (Secondary to anxiety.) and leg swelling (Chronic baseline leg swelling.). Negative for chest pain.  Gastrointestinal: Negative.  Negative for nausea.  Endocrine: Negative for cold intolerance, heat intolerance, polydipsia, polyphagia and polyuria.  Neurological: Positive for headaches. Negative for dizziness and light-headedness.  Hematological: Does not bruise/bleed easily.  Psychiatric/Behavioral: Positive for decreased concentration. Negative for agitation, behavioral problems, confusion, dysphoric mood, hallucinations, self-injury, sleep disturbance and suicidal ideas. The patient is nervous/anxious. The patient does not have insomnia and is not hyperactive.     Social History  Substance Use Topics  . Smoking status: Former Smoker    Years: 5.00  . Smokeless tobacco: Never Used     Comment: Quit smoking in 1980's  .  Alcohol use 0.6 oz/week    1 Glasses of wine per week     Comment: occasional   Objective:   BP 116/80 (BP Location: Left Arm, Patient Position: Sitting, Cuff Size: Large)   Pulse 84   Temp 98.3 F (36.8 C) (Oral)   Resp 16   Wt 274 lb (124.3 kg)   BMI 37.16 kg/m  Vitals:   05/19/17 1121  BP: 116/80  Pulse: 84  Resp: 16  Temp: 98.3 F (36.8 C)  TempSrc: Oral  Weight: 274 lb (124.3 kg)   Depression screen [facility] 2/9 07/21/2016 07/13/2015  Decreased Interest 2 1  Down, Depressed, Hopeless 1 0  PHQ - 2 Score 3 1  Altered sleeping 0 -  Tired, decreased energy 2 -  Change in appetite 0 -  Feeling bad or failure about yourself  1 -  Trouble concentrating 2 -  Moving slowly or fidgety/restless 1 -  Suicidal thoughts 0 -  PHQ-9 Score 9 -  Difficult doing work/chores Very difficult -      Physical Exam  Constitutional: She is oriented to person, place, and time. She appears well-developed and well-nourished.  Cardiovascular: Normal rate and regular rhythm.   Pulmonary/Chest: Effort normal.  Neurological: She is alert and oriented to person, place, and time.  Skin: Skin is warm and dry.  Psychiatric: Her behavior is normal. Her mood appears anxious.        Assessment & Plan:     1. Depression, unspecified depression type  Currently takes zoloft 200 mg. Will add Buspar. Counseled on s/sx of serotonin syndrome. We can increase every few days if needed. Referral to connected care for [facility] based counseling. Follow up with Dr. Rosanna Randy.   - busPIRone (BUSPAR) 5 MG tablet; Take 1 tablet (5 mg total) by mouth 3 (three) times daily.  Dispense: 90 tablet; Refill: 2 - Ambulatory referral to Connected Care  2. Anxiety, generalized  - busPIRone (BUSPAR) 5 MG tablet; Take 1 tablet (5 mg total) by mouth 3 (three) times daily.  Dispense: 90 tablet; Refill: 2 - Ambulatory referral to Connected Care  Return in about 4 weeks (around 06/16/2017) for Dr. Rosanna Randy,  depression/anxiety.  The entirety of the information documented in the History of Present Illness, Review of Systems and Physical Exam were personally obtained by me. Portions of this information were initially documented by Ashley Royalty, CMA and reviewed by me for thoroughness and accuracy.         Trinna Post, PA-C  [facility]

## 2017-05-23 ENCOUNTER — Encounter: Payer: Self-pay | Admitting: Physician Assistant

## 2017-05-23 DIAGNOSIS — F419 Anxiety disorder, unspecified: Secondary | ICD-10-CM

## 2017-05-23 MED ORDER — BUSPIRONE HCL 10 MG PO TABS: 5.0000 mg | ORAL_TABLET | Freq: Three times a day (TID) | ORAL | 2 refills | Status: DC

## 2017-05-23 NOTE — Telephone Encounter (Signed)
Sent in 10 mg Buspar for patient to take 1/2 tab since it was on back order. [facility]. Thanks.

## 2017-06-19 ENCOUNTER — Ambulatory Visit (INDEPENDENT_AMBULATORY_CARE_PROVIDER_SITE_OTHER): Payer: 59 | Admitting: Family Medicine

## 2017-06-19 ENCOUNTER — Encounter: Payer: Self-pay | Admitting: Family Medicine

## 2017-06-19 VITALS — BP 116/78 | HR 90 | Temp 98.0°F | Resp 16 | Wt 276.0 lb

## 2017-06-19 DIAGNOSIS — F329 Major depressive disorder, single episode, unspecified: Secondary | ICD-10-CM

## 2017-06-19 DIAGNOSIS — M7062 Trochanteric bursitis, left hip: Secondary | ICD-10-CM

## 2017-06-19 DIAGNOSIS — F419 Anxiety disorder, unspecified: Secondary | ICD-10-CM

## 2017-06-19 DIAGNOSIS — F32A Depression, unspecified: Secondary | ICD-10-CM

## 2017-06-19 MED ORDER — BUSPIRONE HCL 10 MG PO TABS: 10.0000 mg | ORAL_TABLET | Freq: Two times a day (BID) | ORAL | 10 refills | Status: DC

## 2017-06-19 MED ORDER — METHYLPREDNISOLONE ACETATE 80 MG/ML IJ SUSP
80.0000 mg | Freq: Once | INTRAMUSCULAR | Status: AC
  Administered 2017-06-19: 80 mg via INTRAMUSCULAR

## 2017-06-19 NOTE — Progress Notes (Signed)
Patient: Lisa Stein Female    DOB: [birth year]   56 y.o.   MRN: [id number] Visit Date: 06/19/2017  Today's Provider: [name], MD   Chief Complaint  Patient presents with  . Anxiety   Subjective:    HPI Pt is here for a follow up of anxiety/depression. She was started on Buspar on 05/19/17 and referred to a counselor. She is taking the Buspar 1/2 tablet 3 times a day. She reports that she is feeling about 50-60% better but she has a hard time getting that last dose in because she does not have her pill bottle. She is wanting to know if we can change it to twice a day.  Patient states she and her husband are dealing with family matters that are causing him great distress. He also recently resigned position of  pastor of a church and this has improved some with her stress    Allergies  Allergen Reactions  . Benzoin     Blisters.  . Biaxin [Clarithromycin] Diarrhea    GI upset  . Sulfa Antibiotics Itching  . Tegretol  [Carbamazepine] Itching  . Betadine  [Povidone Iodine] Rash     Current Outpatient Prescriptions:  .  busPIRone (BUSPAR) 10 MG tablet, Take 1 tablet (10 mg total) by mouth 2 (two) times daily., Disp: 60 tablet, Rfl: 10 .  celecoxib (CELEBREX) 200 MG capsule, Take 200 mg by mouth daily., Disp: , Rfl:  .  Cholecalciferol (VITAMIN D3) 2000 UNITS TABS, Take 2,000 Units by mouth as needed. , Disp: , Rfl:  .  diazepam (VALIUM) 5 MG tablet, Take 5 mg by mouth every 12 (twelve) hours as needed. , Disp: , Rfl:  .  DiphenhydrAMINE HCl, Sleep, 50 MG CAPS, Take 50 mg by mouth at bedtime as needed. , Disp: , Rfl:  .  estradiol (ESTRACE) 1 MG tablet, TAKE 1 TABLET BY MOUTH  DAILY, Disp: 90 tablet, Rfl: 3 .  fluticasone (FLONASE) 50 MCG/ACT nasal spray, Place 1 spray into the nose daily as needed. , Disp: , Rfl:  .  omeprazole (PRILOSEC) 20 MG capsule, Take 1 capsule by mouth two times daily with food, Disp: 180 capsule, Rfl: 3 .  sertraline (ZOLOFT) 100 MG  tablet, TAKE 2 TABLETS BY MOUTH  DAILY, Disp: 180 tablet, Rfl: 3  Current Facility-Administered Medications:  .  methylPREDNISolone acetate (DEPO-MEDROL) injection 80 mg, 80 mg, Intramuscular, Once, Jerrol Banana., MD  Review of Systems  Constitutional: Negative.   HENT: Negative.   Eyes: Negative.   Respiratory: Negative.   Cardiovascular: Negative.   Gastrointestinal: Negative.   Endocrine: Negative.   Genitourinary: Negative.   Musculoskeletal: Negative.   Skin: Negative.   Allergic/Immunologic: Negative.   Neurological: Negative.   Hematological: Negative.   Psychiatric/Behavioral: The patient is nervous/anxious.     Social History  Substance Use Topics  . Smoking status: Former Smoker    Years: 5.00  . Smokeless tobacco: Never Used     Comment: Quit smoking in 1980's  . Alcohol use 0.6 oz/week    1 Glasses of wine per week     Comment: occasional   Objective:   BP 116/78 (BP Location: Left Arm, Patient Position: Sitting, Cuff Size: Large)   Pulse 90   Temp 98 F (36.7 C) (Oral)   Resp 16   Wt 276 lb (125.2 kg)   BMI 37.43 kg/m  Vitals:   06/19/17 1547  BP: 116/78  Pulse: 90  Resp: 16  Temp: 98 F (36.7 C)  TempSrc: Oral  Weight: 276 lb (125.2 kg)     Physical Exam  Constitutional: She is oriented to person, place, and time. She appears well-developed and well-nourished.  Eyes: Pupils are equal, round, and reactive to light. Conjunctivae and EOM are normal.  Neck: Normal range of motion. Neck supple.  Cardiovascular: Normal rate, regular rhythm, normal heart sounds and intact distal pulses.   Pulmonary/Chest: Effort normal and breath sounds normal.  Musculoskeletal: Normal range of motion. She exhibits tenderness (greater trochanter left greater than right).  Neurological: She is alert and oriented to person, place, and time. She has normal reflexes.  Skin: Skin is warm and dry.  Psychiatric: She has a normal mood and affect. Her behavior is  normal. Judgment and thought content normal.        Assessment & Plan:     1. Depression, unspecified depression type Stable per pt.  2. Anxiety Improving. 50-60% better.   - busPIRone (BUSPAR) 10 MG tablet; Take 1 tablet (10 mg total) by mouth 2 (two) times daily.  Dispense: 60 tablet; Refill: 10  3. Greater trochanteric bursitis of left hip  - methylPREDNISolone acetate (DEPO-MEDROL) injection 80 mg; Inject 1 mL (80 mg total) into the muscle once. 4.OA 5.Obesity     HPI, Exam, and A&P Transcribed under the direction and in the presence of Richard L. Cranford Mon, MD  Electronically Signed: Katina Dung, CMA  I have done the exam and reviewed the above chart and it is accurate to the best of my knowledge. Development worker, community has been used in this note in any air is in the dictation or transcription are unintentional.  [name], MD  [place]

## 2017-06-21 ENCOUNTER — Other Ambulatory Visit: Payer: Self-pay | Admitting: Family Medicine

## 2017-06-21 DIAGNOSIS — F419 Anxiety disorder, unspecified: Secondary | ICD-10-CM

## 2017-06-21 IMAGING — CR DG KNEE COMPLETE 4+V*R*
1 series · 4 of 4 positions shown · non-contrast
Comparison: None.

CLINICAL DATA: Acute right knee pain without known injury.

EXAM:
RIGHT KNEE - COMPLETE 4+ VIEW

[Series 1: dg knee complete 4 views right · 0.14mm/px · 4 of 4 slices shown]
[im 1/4]
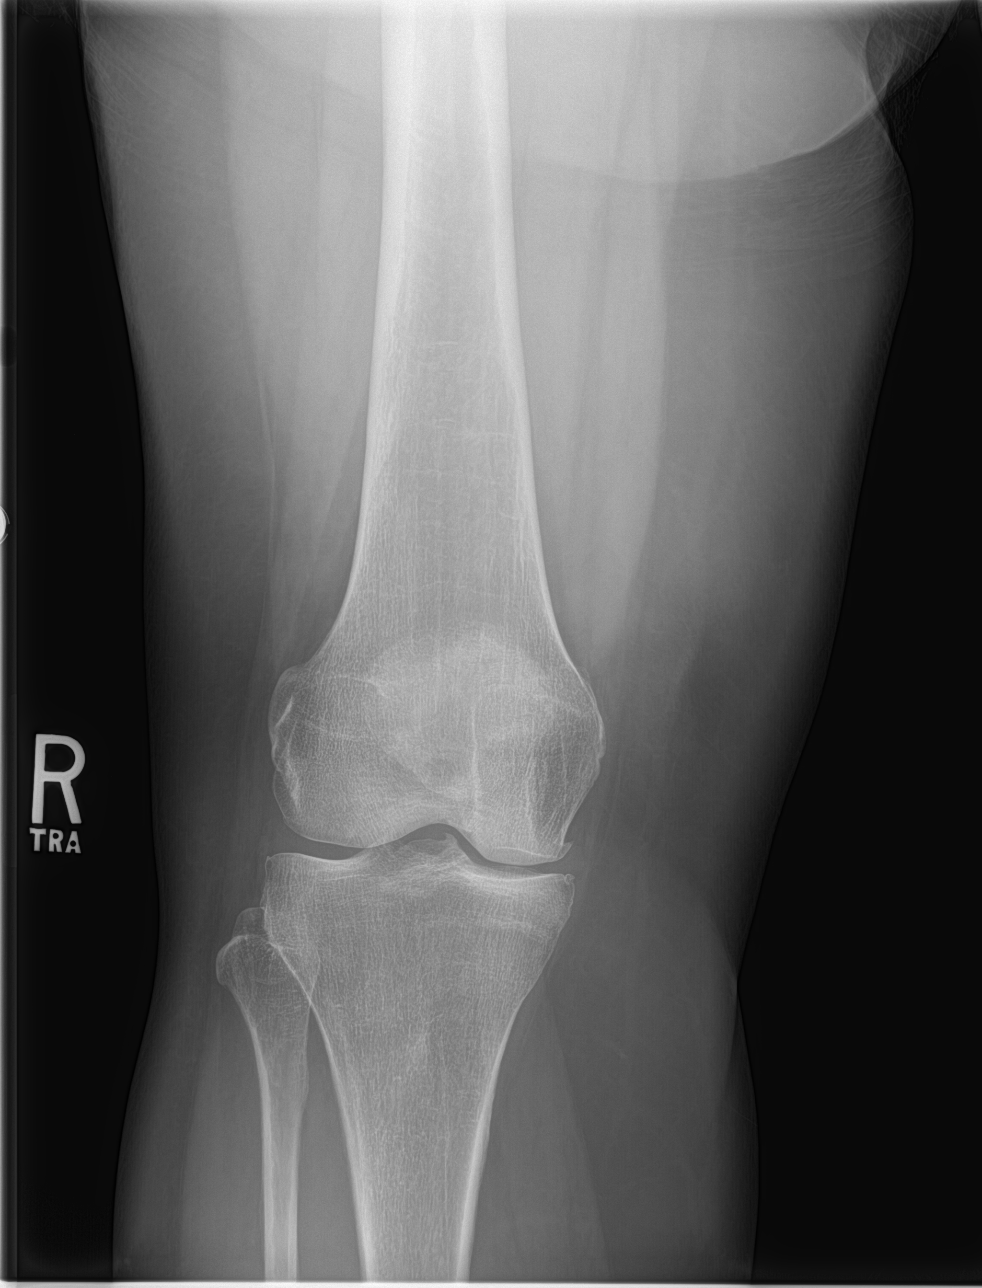
[im 2/4]
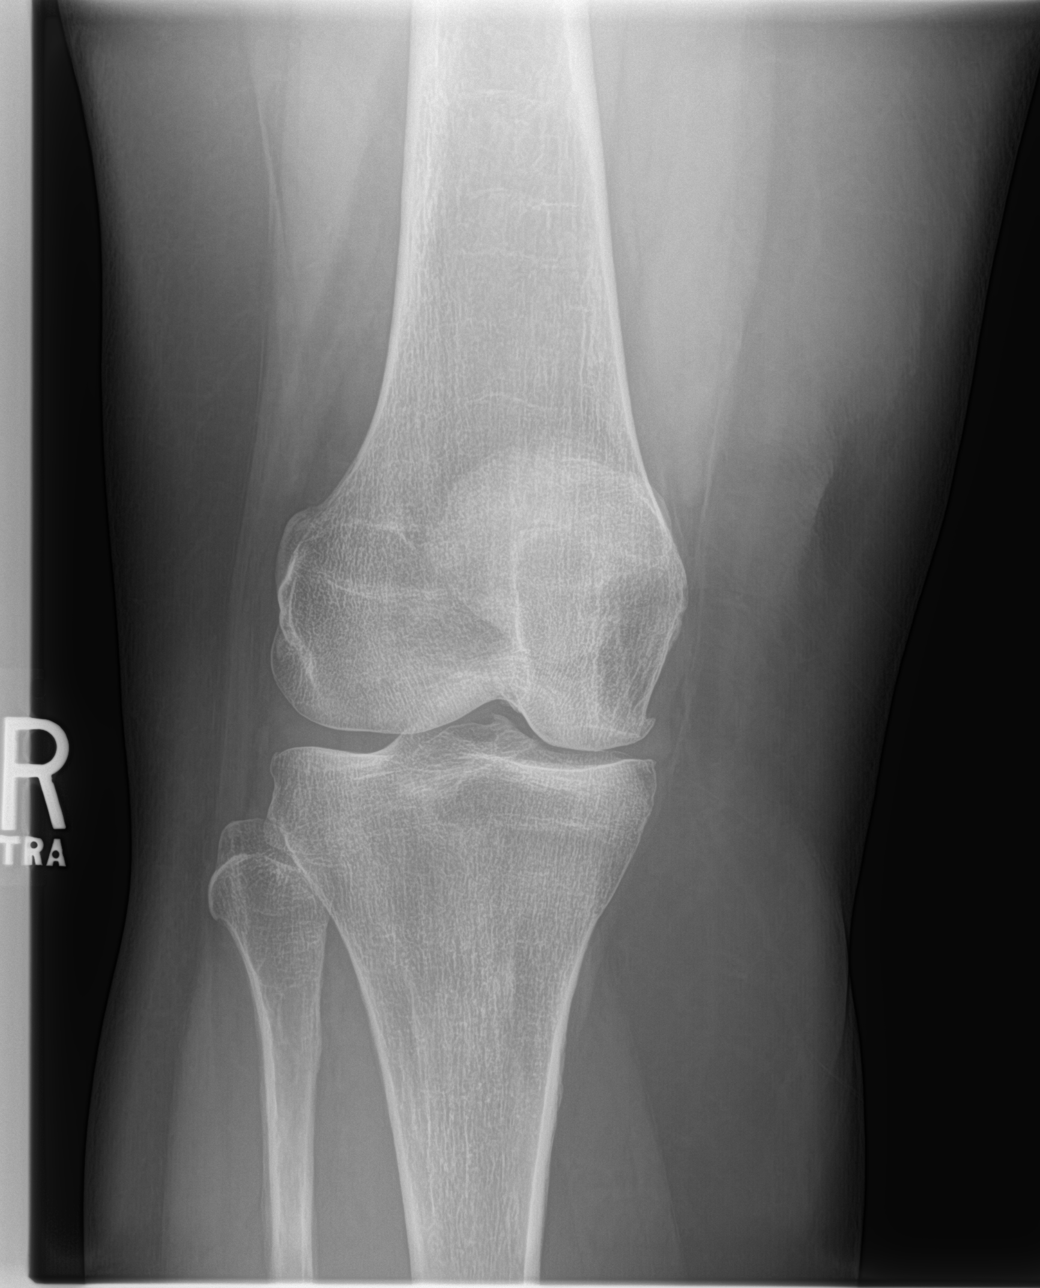
[im 3/4]
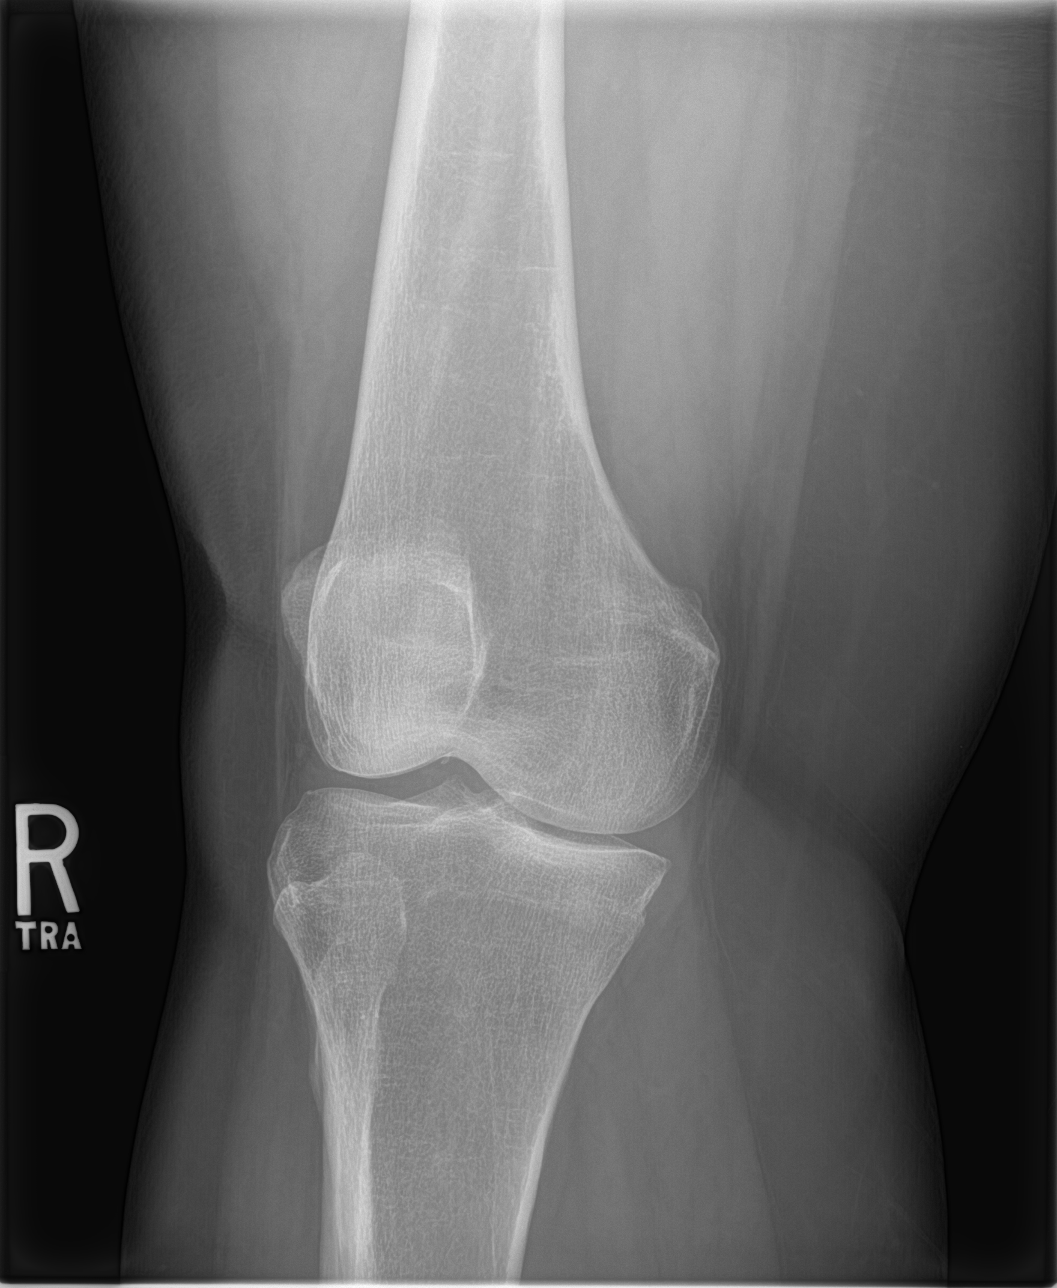
[im 4/4]
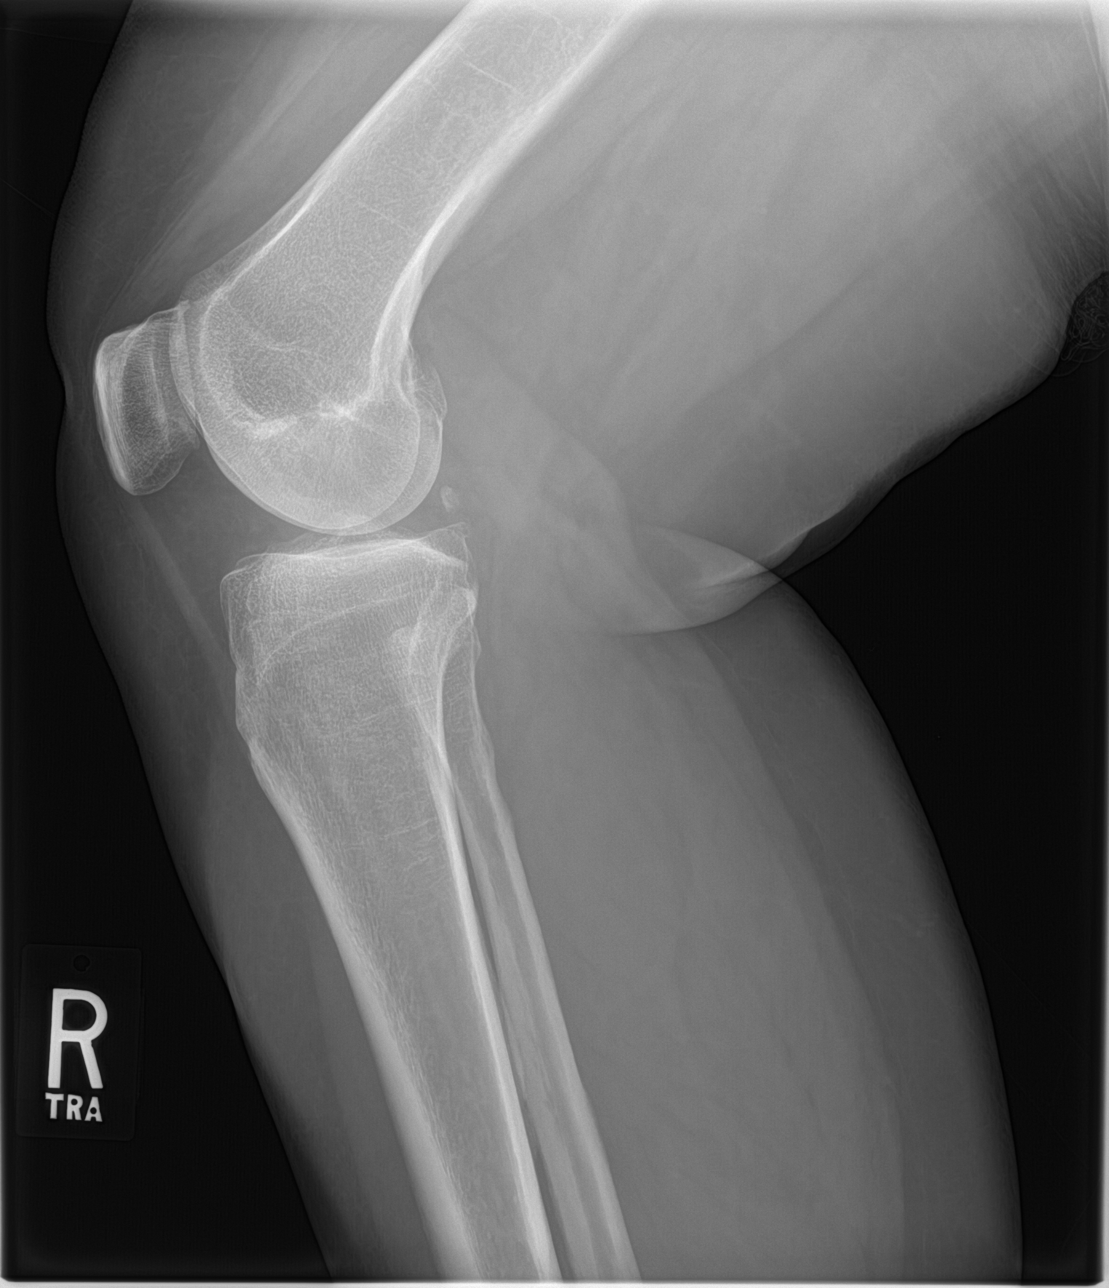

[4 of 4 positions shown; findings below may reference images not displayed]

FINDINGS: There is no evidence of fracture, dislocation, or joint effusion.
Moderate narrowing of medial joint space is noted with osteophyte
formation. Soft tissues are unremarkable.
IMPRESSION: Moderate degenerative joint disease medially. No acute abnormality
seen in the right knee.

## 2017-06-21 MED ORDER — BUSPIRONE HCL 10 MG PO TABS: 10.0000 mg | ORAL_TABLET | Freq: Two times a day (BID) | ORAL | 3 refills | Status: AC

## 2017-06-21 NOTE — Telephone Encounter (Signed)
[facility] faxed a request for the following medication. Thanks CC  busPIRone (BUSPAR) 10 MG tablet

## 2017-07-11 ENCOUNTER — Ambulatory Visit: Payer: 59 | Admitting: Family Medicine

## 2017-09-14 ENCOUNTER — Other Ambulatory Visit: Payer: Self-pay | Admitting: Family Medicine

## 2017-10-16 ENCOUNTER — Ambulatory Visit (INDEPENDENT_AMBULATORY_CARE_PROVIDER_SITE_OTHER): Payer: BLUE CROSS/BLUE SHIELD | Admitting: Family Medicine

## 2017-10-16 ENCOUNTER — Other Ambulatory Visit: Payer: Self-pay

## 2017-10-16 VITALS — BP 120/80 | HR 80 | Temp 98.0°F | Resp 16 | Wt 272.0 lb

## 2017-10-16 DIAGNOSIS — R7303 Prediabetes: Secondary | ICD-10-CM

## 2017-10-16 DIAGNOSIS — E785 Hyperlipidemia, unspecified: Secondary | ICD-10-CM | POA: Diagnosis not present

## 2017-10-16 DIAGNOSIS — R5383 Other fatigue: Secondary | ICD-10-CM

## 2017-10-16 NOTE — Progress Notes (Signed)
DAYLA GASCA  MRN: [id number] DOB: 25-Jan-1961  Subjective:  HPI   The patient is a 57 year old female who presents today for follow up of chronic issues.  She was last seen on 06/19/17.  Depression-Patient was seen for this on last visit and had a PHQ 9 score in August of 17 which was up from her score of 9, 10 months prior.  At that time her Buspar was increased and she states today that she feels much better.    Anxiety-Patient reported being 50-60% better on her last visit.  She feels she is still having increased anxiety due to issues at home.    Depression screen [facility] 2/9 10/16/2017 05/19/2017 07/21/2016 07/13/2015  Decreased Interest 0 3 2 1   Down, Depressed, Hopeless 0 2 1 0  PHQ - 2 Score 0 5 3 1   Altered sleeping 0 1 0 -  Tired, decreased energy 1 3 2  -  Change in appetite 0 1 0 -  Feeling bad or failure about yourself  0 3 1 -  Trouble concentrating 0 3 2 -  Moving slowly or fidgety/restless 0 1 1 -  Suicidal thoughts 0 0 0 -  PHQ-9 Score 1 17 9  -  Difficult doing work/chores Not difficult at all Extremely dIfficult Very difficult -    Patient Active Problem List   Diagnosis Date Noted  . Community acquired pneumonia 11/24/2016  . Acute respiratory failure with hypoxemia ([place]) 11/19/2016  . Sepsis ([place]) 11/17/2016  . Skin nodule 09/05/2016  . Menopause 07/21/2016  . Clinical depression 01/12/2016  . Difficult or painful urination 03/16/2015  . Fatigue 03/16/2015  . Anxiety, generalized 03/16/2015  . Acid reflux 03/16/2015  . Cardiac murmur 03/16/2015  . H/O major abdominal surgery 03/16/2015  . Calculus of kidney 03/16/2015  . Acquired lymphedema 03/16/2015  . Affective disorder, major 03/16/2015  . Arthritis, degenerative 03/16/2015  . Adiposity 03/16/2015  . Raynaud's syndrome 03/16/2015  . Muscle rigidity 03/16/2015  . Fothergill's neuralgia 03/16/2015  . Avitaminosis D 03/16/2015  . DDD (degenerative disc disease), lumbar 11/20/2014  . Neuritis or  radiculitis due to rupture of lumbar intervertebral disc 11/20/2014  . Lower abdominal pain 08/05/2014  . Diarrhea 08/05/2014    Past Medical History:  Diagnosis Date  . Anxiety   . Depression   . GERD (gastroesophageal reflux disease)   . Lymphedema   . Neuralgia   . Osteoarthritis   . Raynaud disease     Social History   Socioeconomic History  . Marital status: Married    Spouse name: Not on file  . Number of children: Not on file  . Years of education: Not on file  . Highest education level: Not on file  Social Needs  . Financial resource strain: Not on file  . Food insecurity - worry: Not on file  . Food insecurity - inability: Not on file  . Transportation needs - medical: Not on file  . Transportation needs - non-medical: Not on file  Occupational History  . Not on file  Tobacco Use  . Smoking status: Former Smoker    Years: 5.00  . Smokeless tobacco: Never Used  . Tobacco comment: Quit smoking in 1980's  Substance and Sexual Activity  . Alcohol use: Yes    Alcohol/week: 0.6 oz    Types: 1 Glasses of wine per week    Comment: occasional  . Drug use: No  . Sexual activity: Not on file  Other Topics Concern  .  Not on file  Social History Narrative   Lives at home with husband and family. Independent at baseline    Outpatient Encounter Medications as of 10/16/2017  Medication Sig  . busPIRone (BUSPAR) 10 MG tablet Take 1 tablet (10 mg total) by mouth 2 (two) times daily.  . celecoxib (CELEBREX) 200 MG capsule TAKE 1 CAPSULE BY MOUTH  DAILY  . Cholecalciferol (VITAMIN D3) 2000 UNITS TABS Take 2,000 Units by mouth as needed.   . diazepam (VALIUM) 5 MG tablet Take 5 mg by mouth every 12 (twelve) hours as needed.   . DiphenhydrAMINE HCl, Sleep, 50 MG CAPS Take 50 mg by mouth at bedtime as needed.   Marland Kitchen estradiol (ESTRACE) 1 MG tablet TAKE 1 TABLET BY MOUTH  DAILY  . fluticasone (FLONASE) 50 MCG/ACT nasal spray Place 1 spray into the nose daily as needed.   Marland Kitchen  omeprazole (PRILOSEC) 20 MG capsule TAKE 1 CAPSULE BY MOUTH TWO TIMES DAILY WITH FOOD  . sertraline (ZOLOFT) 100 MG tablet TAKE 2 TABLETS BY MOUTH  DAILY   No facility-administered encounter medications on file as of 10/16/2017.     Allergies  Allergen Reactions  . Benzoin     Blisters.  . Biaxin [Clarithromycin] Diarrhea    GI upset  . Sulfa Antibiotics Itching  . Tegretol  [Carbamazepine] Itching  . Betadine  [Povidone Iodine] Rash    Review of Systems  Constitutional: Negative for fever and malaise/fatigue.  HENT: Negative.   Eyes: Negative.   Respiratory: Negative for cough, shortness of breath and wheezing.   Cardiovascular: Negative for chest pain, palpitations, orthopnea, claudication and leg swelling.  Gastrointestinal: Negative.   Skin: Negative.   Neurological: Negative for weakness.  Endo/Heme/Allergies: Negative.   Psychiatric/Behavioral: Positive for depression. Negative for hallucinations, memory loss, substance abuse and suicidal ideas. The patient is nervous/anxious. The patient does not have insomnia.     Objective:  BP 120/80 (BP Location: Right Arm, Patient Position: Sitting, Cuff Size: Normal)   Pulse 80   Temp 98 F (36.7 C) (Oral)   Resp 16   Wt 272 lb (123.4 kg)   BMI 36.89 kg/m   Physical Exam  Constitutional: She is oriented to person, place, and time and well-developed, well-nourished, and in no distress.  Obese pleasant WF NAD.  HENT:  Head: Normocephalic and atraumatic.  Eyes: Conjunctivae are normal. No scleral icterus.  Neck: No thyromegaly present.  Cardiovascular: Normal rate and regular rhythm.  Pulmonary/Chest: Effort normal and breath sounds normal.  Abdominal: Soft.  Neurological: She is alert and oriented to person, place, and time.  Skin: Skin is warm and dry.  Psychiatric: Mood, memory, affect and judgment normal.    Assessment and Plan :  1. Other fatigue  - CBC with Differential/Platelet - Comprehensive metabolic  panel - TSH  2. Hyperlipidemia, unspecified hyperlipidemia type  - Lipid Panel With LDL/HDL Ratio  3. Pre-diabetes  - Hemoglobin A1c 4.Obesity 5.OA 6.MDD/GAD  I have done the exam and reviewed the chart and it is accurate to the best of my knowledge. Development worker, community has been used and  any errors in dictation or transcription are unintentional. Miguel Aschoff M.D. [facility]

## 2017-11-01 LAB — COMPREHENSIVE METABOLIC PANEL
ALBUMIN: 4.5 g/dL (ref 3.5–5.5)
ALT: 14 IU/L (ref 0–32)
AST: 20 IU/L (ref 0–40)
Albumin/Globulin Ratio: 2 (ref 1.2–2.2)
Alkaline Phosphatase: 73 IU/L (ref 39–117)
BUN/Creatinine Ratio: 21 (ref 9–23)
BUN: 18 mg/dL (ref 6–24)
Bilirubin Total: 0.3 mg/dL (ref 0.0–1.2)
CALCIUM: 9.3 mg/dL (ref 8.7–10.2)
CHLORIDE: 104 mmol/L (ref 96–106)
CO2: 21 mmol/L (ref 20–29)
Creatinine, Ser: 0.87 mg/dL (ref 0.57–1.00)
GFR calc Af Amer: 85 mL/min/{1.73_m2} (ref >59)
GFR, EST NON AFRICAN AMERICAN: 74 mL/min/{1.73_m2} (ref >59)
GLOBULIN, TOTAL: 2.3 g/dL (ref 1.5–4.5)
GLUCOSE: 88 mg/dL (ref 65–99)
POTASSIUM: 4.7 mmol/L (ref 3.5–5.2)
Sodium: 142 mmol/L (ref 134–144)
Total Protein: 6.8 g/dL (ref 6.0–8.5)

## 2017-11-01 LAB — CBC WITH DIFFERENTIAL/PLATELET
BASOS ABS: 0 10*3/uL (ref 0.0–0.2)
Basos: 0 %
EOS (ABSOLUTE): 0.1 10*3/uL (ref 0.0–0.4)
EOS: 2 %
HEMATOCRIT: 44.4 % (ref 34.0–46.6)
HEMOGLOBIN: 14.5 g/dL (ref 11.1–15.9)
IMMATURE GRANULOCYTES: 0 %
Immature Grans (Abs): 0 10*3/uL (ref 0.0–0.1)
Lymphocytes Absolute: 1.7 10*3/uL (ref 0.7–3.1)
Lymphs: 37 %
MCH: 27.5 pg (ref 26.6–33.0)
MCHC: 32.7 g/dL (ref 31.5–35.7)
MCV: 84 fL (ref 79–97)
MONOCYTES: 8 %
Monocytes Absolute: 0.4 10*3/uL (ref 0.1–0.9)
NEUTROS PCT: 53 %
Neutrophils Absolute: 2.5 10*3/uL (ref 1.4–7.0)
Platelets: 272 10*3/uL (ref 150–379)
RBC: 5.27 x10E6/uL (ref 3.77–5.28)
RDW: 13.9 % (ref 12.3–15.4)
WBC: 4.7 10*3/uL (ref 3.4–10.8)

## 2017-11-01 LAB — LIPID PANEL WITH LDL/HDL RATIO
Cholesterol, Total: 234 mg/dL — ABNORMAL HIGH (ref 100–199)
HDL: 90 mg/dL (ref >39)
LDL CALC: 119 mg/dL — AB (ref 0–99)
LDL/HDL RATIO: 1.3 ratio (ref 0.0–3.2)
Triglycerides: 124 mg/dL (ref 0–149)
VLDL Cholesterol Cal: 25 mg/dL (ref 5–40)

## 2017-11-01 LAB — TSH: TSH: 1.69 u[IU]/mL (ref 0.450–4.500)

## 2017-11-01 LAB — HEMOGLOBIN A1C
Est. average glucose Bld gHb Est-mCnc: 111 mg/dL
Hgb A1c MFr Bld: 5.5 % (ref 4.8–5.6)

## 2017-11-01 NOTE — Progress Notes (Signed)
Advised  ED 

## 2018-02-14 ENCOUNTER — Ambulatory Visit: Payer: BLUE CROSS/BLUE SHIELD | Admitting: Family Medicine

## 2018-02-14 VITALS — BP 120/80 | HR 68 | Temp 98.4°F | Resp 16 | Wt 277.0 lb

## 2018-02-14 DIAGNOSIS — M1990 Unspecified osteoarthritis, unspecified site: Secondary | ICD-10-CM

## 2018-02-14 DIAGNOSIS — I890 Lymphedema, not elsewhere classified: Secondary | ICD-10-CM | POA: Diagnosis not present

## 2018-02-14 DIAGNOSIS — F329 Major depressive disorder, single episode, unspecified: Secondary | ICD-10-CM

## 2018-02-14 DIAGNOSIS — F32A Depression, unspecified: Secondary | ICD-10-CM

## 2018-02-14 NOTE — Progress Notes (Signed)
Lisa Stein  MRN: [id number] DOB: 1961/06/07  Subjective:  HPI   The patient is a 57 year old female who presents for follow up of her depression.   The patient states that since increasing her Buspirone she feels she has definitely improved.  She feels this dose is doing well for her.  Patient Active Problem List   Diagnosis Date Noted  . Community acquired pneumonia 11/24/2016  . Acute respiratory failure with hypoxemia ([place]) 11/19/2016  . Sepsis ([place]) 11/17/2016  . Skin nodule 09/05/2016  . Menopause 07/21/2016  . Clinical depression 01/12/2016  . Difficult or painful urination 03/16/2015  . Fatigue 03/16/2015  . Anxiety, generalized 03/16/2015  . Acid reflux 03/16/2015  . Cardiac murmur 03/16/2015  . H/O major abdominal surgery 03/16/2015  . Calculus of kidney 03/16/2015  . Acquired lymphedema 03/16/2015  . Affective disorder, major 03/16/2015  . Arthritis, degenerative 03/16/2015  . Adiposity 03/16/2015  . Raynaud's syndrome 03/16/2015  . Muscle rigidity 03/16/2015  . Fothergill's neuralgia 03/16/2015  . Avitaminosis D 03/16/2015  . DDD (degenerative disc disease), lumbar 11/20/2014  . Neuritis or radiculitis due to rupture of lumbar intervertebral disc 11/20/2014  . Lower abdominal pain 08/05/2014  . Diarrhea 08/05/2014    Past Medical History:  Diagnosis Date  . Anxiety   . Depression   . GERD (gastroesophageal reflux disease)   . Lymphedema   . Neuralgia   . Osteoarthritis   . Raynaud disease     Social History   Socioeconomic History  . Marital status: Married    Spouse name: Not on file  . Number of children: Not on file  . Years of education: Not on file  . Highest education level: Not on file  Occupational History  . Not on file  Social Needs  . Financial resource strain: Not on file  . Food insecurity:    Worry: Not on file    Inability: Not on file  . Transportation needs:    Medical: Not on file    Non-medical: Not on file    Tobacco Use  . Smoking status: Former Smoker    Years: 5.00  . Smokeless tobacco: Never Used  . Tobacco comment: Quit smoking in 1980's  Substance and Sexual Activity  . Alcohol use: Yes    Alcohol/week: 0.6 oz    Types: 1 Glasses of wine per week    Comment: occasional  . Drug use: No  . Sexual activity: Not on file  Lifestyle  . Physical activity:    Days per week: Not on file    Minutes per session: Not on file  . Stress: Not on file  Relationships  . Social connections:    Talks on phone: Not on file    Gets together: Not on file    Attends religious service: Not on file    Active member of club or organization: Not on file    Attends meetings of clubs or organizations: Not on file    Relationship status: Not on file  . Intimate partner violence:    Fear of current or ex partner: Not on file    Emotionally abused: Not on file    Physically abused: Not on file    Forced sexual activity: Not on file  Other Topics Concern  . Not on file  Social History Narrative   Lives at home with husband and family. Independent at baseline    Outpatient Encounter Medications as of 02/14/2018  Medication Sig  .  busPIRone (BUSPAR) 10 MG tablet Take 1 tablet (10 mg total) by mouth 2 (two) times daily.  . celecoxib (CELEBREX) 100 MG capsule Take 100 mg by mouth daily.  . Cholecalciferol (VITAMIN D3) 2000 UNITS TABS Take 2,000 Units by mouth as needed.   . diazepam (VALIUM) 5 MG tablet Take 5 mg by mouth every 12 (twelve) hours as needed.   . DiphenhydrAMINE HCl, Sleep, 50 MG CAPS Take 50 mg by mouth at bedtime as needed.   Marland Kitchen estradiol (ESTRACE) 1 MG tablet TAKE 1 TABLET BY MOUTH  DAILY  . fluticasone (FLONASE) 50 MCG/ACT nasal spray Place 1 spray into the nose daily as needed.   Marland Kitchen omeprazole (PRILOSEC) 20 MG capsule TAKE 1 CAPSULE BY MOUTH TWO TIMES DAILY WITH FOOD  . sertraline (ZOLOFT) 100 MG tablet TAKE 2 TABLETS BY MOUTH  DAILY  . [DISCONTINUED] celecoxib (CELEBREX) 200 MG capsule  TAKE 1 CAPSULE BY MOUTH  DAILY  . [DISCONTINUED] venlafaxine (EFFEXOR) 37.5 MG tablet Take 1 tablet (37.5 mg total) by mouth 2 (two) times daily. (Patient not taking: Reported on 11/02/2015)   No facility-administered encounter medications on file as of 02/14/2018.     Allergies  Allergen Reactions  . Benzoin     Blisters.  . Biaxin [Clarithromycin] Diarrhea    GI upset  . Sulfa Antibiotics Itching  . Tegretol  [Carbamazepine] Itching  . Betadine  [Povidone Iodine] Rash    Review of Systems  Constitutional: Negative for fever and malaise/fatigue.  Eyes: Negative.   Respiratory: Negative for cough, shortness of breath and wheezing.   Cardiovascular: Positive for leg swelling (lymphadema left leg). Negative for chest pain, palpitations, orthopnea and claudication.  Gastrointestinal: Negative.   Skin: Negative.   Neurological: Negative for dizziness and headaches.  Endo/Heme/Allergies: Negative.   Psychiatric/Behavioral: Negative for depression, hallucinations, memory loss, substance abuse and suicidal ideas. The patient is not nervous/anxious and does not have insomnia.     Objective:  BP 120/80 (BP Location: Right Arm, Patient Position: Sitting, Cuff Size: Normal)   Pulse 68   Temp 98.4 F (36.9 C) (Oral)   Resp 16   Wt 277 lb (125.6 kg)   BMI 37.57 kg/m   Physical Exam  Constitutional: She is oriented to person, place, and time and well-developed, well-nourished, and in no distress.  HENT:  Head: Normocephalic and atraumatic.  Eyes: Conjunctivae are normal.  Neck: No thyromegaly present.  Cardiovascular: Normal rate, regular rhythm and normal heart sounds.  Pulmonary/Chest: Effort normal and breath sounds normal.  Abdominal: Soft.  Musculoskeletal:  Chronic mild lymphedema of LE.  Neurological: She is alert and oriented to person, place, and time. Gait normal. Coordination normal. GCS score is 15.  Skin: Skin is warm and dry.  Psychiatric: Memory, affect and judgment  normal.    Assessment and Plan :  Depression  75% improved. OA Chronic Lymphedema Encouraged support hose.  I have done the exam and reviewed the chart and it is accurate to the best of my knowledge. Development worker, community has been used and  any errors in dictation or transcription are unintentional. Miguel Aschoff M.D. [facility]

## 2018-03-02 ENCOUNTER — Other Ambulatory Visit: Payer: Self-pay | Admitting: Family Medicine

## 2018-03-02 DIAGNOSIS — F411 Generalized anxiety disorder: Secondary | ICD-10-CM

## 2018-03-09 ENCOUNTER — Other Ambulatory Visit: Payer: Self-pay | Admitting: Family Medicine

## 2018-03-09 DIAGNOSIS — N951 Menopausal and female climacteric states: Secondary | ICD-10-CM

## 2018-03-19 ENCOUNTER — Encounter: Payer: Self-pay | Admitting: Physician Assistant

## 2018-03-19 ENCOUNTER — Ambulatory Visit: Payer: BLUE CROSS/BLUE SHIELD | Admitting: Physician Assistant

## 2018-03-19 VITALS — BP 120/70 | HR 70 | Temp 98.2°F | Resp 16 | Wt 276.0 lb

## 2018-03-19 DIAGNOSIS — B373 Candidiasis of vulva and vagina: Secondary | ICD-10-CM

## 2018-03-19 DIAGNOSIS — N898 Other specified noninflammatory disorders of vagina: Secondary | ICD-10-CM

## 2018-03-19 DIAGNOSIS — B3731 Acute candidiasis of vulva and vagina: Secondary | ICD-10-CM

## 2018-03-19 MED ORDER — FLUCONAZOLE 150 MG PO TABS: 150.0000 mg | ORAL_TABLET | Freq: Once | ORAL | 0 refills | Status: AC

## 2018-03-19 MED ORDER — HYDROCORTISONE 1 % EX LOTN
1.0000 | TOPICAL_LOTION | Freq: Two times a day (BID) | CUTANEOUS | 0 refills | Status: DC
Start: 2018-03-19 — End: 2019-10-01

## 2018-03-19 NOTE — Patient Instructions (Signed)

## 2018-03-19 NOTE — Progress Notes (Signed)
Patient: Lisa Stein Female    DOB: [birth year]   57 y.o.   MRN: [id number] Visit Date: 03/19/2018  Today's Provider: Mar Daring, PA-C   Chief Complaint  Patient presents with  . Vaginal Itching   Subjective:    Vaginal Itching  The patient's primary symptoms include genital itching. The patient's pertinent negatives include no genital odor, pelvic pain or vaginal discharge. This is a new problem. The current episode started more than 1 month ago (off and on). The problem has been gradually worsening. She is not pregnant. Pertinent negatives include no hematuria, painful intercourse or urgency. Associated symptoms comments: Burning on the outside of vaginal area. She has tried antifungals (non irritating vaginal wash and wipes) for the symptoms. The treatment provided no relief. She is not sexually active.      Allergies  Allergen Reactions  . Benzoin     Blisters.  . Biaxin [Clarithromycin] Diarrhea    GI upset  . Sulfa Antibiotics Itching  . Tegretol  [Carbamazepine] Itching  . Betadine  [Povidone Iodine] Rash     Current Outpatient Medications:  .  busPIRone (BUSPAR) 10 MG tablet, Take 1 tablet (10 mg total) by mouth 2 (two) times daily., Disp: 180 tablet, Rfl: 3 .  celecoxib (CELEBREX) 100 MG capsule, Take 100 mg by mouth daily., Disp: , Rfl:  .  Cholecalciferol (VITAMIN D3) 2000 UNITS TABS, Take 2,000 Units by mouth as needed. , Disp: , Rfl:  .  diazepam (VALIUM) 5 MG tablet, Take 5 mg by mouth every 12 (twelve) hours as needed. , Disp: , Rfl:  .  DiphenhydrAMINE HCl, Sleep, 50 MG CAPS, Take 50 mg by mouth at bedtime as needed. , Disp: , Rfl:  .  estradiol (ESTRACE) 1 MG tablet, TAKE 1 TABLET BY MOUTH  DAILY, Disp: 90 tablet, Rfl: 3 .  fluticasone (FLONASE) 50 MCG/ACT nasal spray, Place 1 spray into the nose daily as needed. , Disp: , Rfl:  .  omeprazole (PRILOSEC) 20 MG capsule, TAKE 1 CAPSULE BY MOUTH TWO TIMES DAILY WITH FOOD, Disp: 180 capsule, Rfl:  3 .  sertraline (ZOLOFT) 100 MG tablet, TAKE 2 TABLETS BY MOUTH  DAILY, Disp: 180 tablet, Rfl: 3  Review of Systems  Constitutional: Negative.   Respiratory: Negative.   Cardiovascular: Negative.   Genitourinary: Negative for hematuria, pelvic pain, urgency and vaginal discharge.    Social History   Tobacco Use  . Smoking status: Former Smoker    Years: 5.00  . Smokeless tobacco: Never Used  . Tobacco comment: Quit smoking in 1980's  Substance Use Topics  . Alcohol use: Yes    Alcohol/week: 0.6 oz    Types: 1 Glasses of wine per week    Comment: occasional   Objective:   BP 120/70 (BP Location: Left Arm, Patient Position: Sitting, Cuff Size: Large)   Pulse 70   Temp 98.2 F (36.8 C) (Oral)   Resp 16   Wt 276 lb (125.2 kg)   SpO2 97%   BMI 37.43 kg/m    Physical Exam  Constitutional: She appears well-developed and well-nourished. No distress.  Neck: Normal range of motion. Neck supple. No JVD present.  Cardiovascular: Normal rate, regular rhythm and normal heart sounds. Exam reveals no gallop and no friction rub.  No murmur heard. Pulmonary/Chest: Effort normal and breath sounds normal. No respiratory distress. She has no wheezes. She has no rales.  Genitourinary: There is rash on the right labia. There  is no tenderness, lesion or injury on the right labia. There is rash on the left labia. There is no tenderness, lesion or injury on the left labia. There is erythema in the vagina. Vaginal discharge (white, milky, some thicker discharge externally) found.  Skin: She is not diaphoretic.  Vitals reviewed.     Assessment & Plan:     1. Vaginal yeast infection Suspect irritation due to chronic yeast infection. NuSwab collected to confirm. Diflucan sent in as well as hydrocortisone cream for external irritation. She is to call if symptoms return once completed and may do loading dose of 3 consecutive diflucan to see if this will resolve. May consider long term terazol cream  to have on hand for treatment at onset of symptoms. Discussed also trying to stick with one product and not changing. Avoid fragrance soaps. She does wear panty liner for urine leakage so make sure to change frequently. Also discussed vaginal probiotics. If NuSwab is negative will refer to GYN for consideration of lichen sclerosis work up to r/o.  - NuSwab Vaginitis (VG) - fluconazole (DIFLUCAN) 150 MG tablet; Take 1 tablet (150 mg total) by mouth once for 1 dose. May repeat in 48-72 hrs if needed  Dispense: 2 tablet; Refill: 0  2. Vaginal discharge - NuSwab Vaginitis (VG)   3. Vaginal irritation - NuSwab Vaginitis (VG) - hydrocortisone 1 % lotion; Apply 1 application topically 2 (two) times daily.  Dispense: 118 mL; Refill: 0       Mar Daring, PA-C  [facility]

## 2018-03-21 ENCOUNTER — Telehealth: Payer: Self-pay

## 2018-03-21 NOTE — Telephone Encounter (Signed)
-----   Message from Mar Daring, PA-C sent at 03/21/2018  9:35 AM EDT ----- Swab was positive for yeast. Call if symptoms do not improve following diflucan treatment.

## 2018-03-21 NOTE — Telephone Encounter (Signed)
Patient advised as below.  

## 2018-03-22 LAB — NUSWAB VAGINITIS (VG)
CANDIDA GLABRATA, NAA: NEGATIVE
Candida albicans, NAA: POSITIVE — AB
TRICH VAG BY NAA: NEGATIVE

## 2018-04-19 ENCOUNTER — Encounter: Payer: Self-pay | Admitting: *Deleted

## 2018-04-19 ENCOUNTER — Other Ambulatory Visit: Payer: Self-pay

## 2018-04-19 ENCOUNTER — Emergency Department: Payer: BLUE CROSS/BLUE SHIELD

## 2018-04-19 ENCOUNTER — Emergency Department
Admission: EM | Admit: 2018-04-19 | Discharge: 2018-04-20 | Disposition: A | Discharge status: Home / Self Care | Payer: BLUE CROSS/BLUE SHIELD | Attending: Emergency Medicine | Admitting: Emergency Medicine

## 2018-04-19 DIAGNOSIS — Z87891 Personal history of nicotine dependence: Secondary | ICD-10-CM | POA: Diagnosis not present

## 2018-04-19 DIAGNOSIS — Z96619 Presence of unspecified artificial shoulder joint: Secondary | ICD-10-CM | POA: Diagnosis not present

## 2018-04-19 DIAGNOSIS — N200 Calculus of kidney: Secondary | ICD-10-CM | POA: Diagnosis not present

## 2018-04-19 DIAGNOSIS — R1011 Right upper quadrant pain: Secondary | ICD-10-CM | POA: Diagnosis not present

## 2018-04-19 DIAGNOSIS — N132 Hydronephrosis with renal and ureteral calculous obstruction: Secondary | ICD-10-CM | POA: Diagnosis not present

## 2018-04-19 DIAGNOSIS — Z79899 Other long term (current) drug therapy: Secondary | ICD-10-CM | POA: Diagnosis not present

## 2018-04-19 DIAGNOSIS — Z96659 Presence of unspecified artificial knee joint: Secondary | ICD-10-CM | POA: Diagnosis not present

## 2018-04-19 LAB — COMPREHENSIVE METABOLIC PANEL
ALBUMIN: 4.4 g/dL (ref 3.5–5.0)
ALT: 15 U/L (ref 0–44)
AST: 30 U/L (ref 15–41)
Alkaline Phosphatase: 62 U/L (ref 38–126)
Anion gap: 11 (ref 5–15)
BILIRUBIN TOTAL: 0.7 mg/dL (ref 0.3–1.2)
BUN: 19 mg/dL (ref 6–20)
CO2: 26 mmol/L (ref 22–32)
Calcium: 9.2 mg/dL (ref 8.9–10.3)
Chloride: 105 mmol/L (ref 98–111)
Creatinine, Ser: 1.05 mg/dL — ABNORMAL HIGH (ref 0.44–1.00)
GFR calc Af Amer: >60 mL/min (ref >60)
GFR calc non Af Amer: 57 mL/min — ABNORMAL LOW (ref >60)
GLUCOSE: 117 mg/dL — AB (ref 70–99)
POTASSIUM: 4.3 mmol/L (ref 3.5–5.1)
SODIUM: 142 mmol/L (ref 135–145)
TOTAL PROTEIN: 7.1 g/dL (ref 6.5–8.1)

## 2018-04-19 LAB — CBC WITH DIFFERENTIAL/PLATELET
BASOS ABS: 0.1 10*3/uL (ref 0–0.1)
BASOS PCT: 1 %
EOS ABS: 0.1 10*3/uL (ref 0–0.7)
Eosinophils Relative: 1 %
HEMATOCRIT: 42.3 % (ref 35.0–47.0)
HEMOGLOBIN: 13.7 g/dL (ref 12.0–16.0)
Lymphocytes Relative: 25 %
Lymphs Abs: 1.9 10*3/uL (ref 1.0–3.6)
MCH: 26.8 pg (ref 26.0–34.0)
MCHC: 32.4 g/dL (ref 32.0–36.0)
MCV: 82.7 fL (ref 80.0–100.0)
MONO ABS: 0.8 10*3/uL (ref 0.2–0.9)
Monocytes Relative: 10 %
NEUTROS ABS: 4.8 10*3/uL (ref 1.4–6.5)
NEUTROS PCT: 63 %
Platelets: 253 10*3/uL (ref 150–440)
RBC: 5.11 MIL/uL (ref 3.80–5.20)
RDW: 13.8 % (ref 11.5–14.5)
WBC: 7.6 10*3/uL (ref 3.6–11.0)

## 2018-04-19 LAB — URINALYSIS, COMPLETE (UACMP) WITH MICROSCOPIC
BACTERIA UA: NONE SEEN
Glucose, UA: NEGATIVE mg/dL
KETONES UR: 20 mg/dL — AB
LEUKOCYTES UA: NEGATIVE
Nitrite: NEGATIVE
Protein, ur: 100 mg/dL — AB
SPECIFIC GRAVITY, URINE: 1.035 — AB (ref 1.005–1.030)
pH: 5 (ref 5.0–8.0)

## 2018-04-19 MED ORDER — OXYCODONE-ACETAMINOPHEN 5-325 MG PO TABS: 1.0000 | ORAL_TABLET | ORAL | 0 refills | Status: DC | PRN

## 2018-04-19 MED ORDER — ONDANSETRON HCL 4 MG/2ML IJ SOLN
4.0000 mg | Freq: Once | INTRAMUSCULAR | Status: AC
  Administered 2018-04-19: 4 mg via INTRAVENOUS

## 2018-04-19 MED ORDER — ONDANSETRON HCL 4 MG/2ML IJ SOLN
INTRAMUSCULAR | Status: AC
  Filled 2018-04-19: qty 2

## 2018-04-19 MED ORDER — KETOROLAC TROMETHAMINE 30 MG/ML IJ SOLN
INTRAMUSCULAR | Status: AC
  Filled 2018-04-19: qty 1

## 2018-04-19 MED ORDER — SODIUM CHLORIDE 0.9 % IV BOLUS
1000.0000 mL | Freq: Once | INTRAVENOUS | Status: AC
  Administered 2018-04-19: 1000 mL via INTRAVENOUS

## 2018-04-19 MED ORDER — FENTANYL CITRATE (PF) 100 MCG/2ML IJ SOLN
50.0000 ug | Freq: Once | INTRAMUSCULAR | Status: AC
  Administered 2018-04-19: 50 ug via INTRAVENOUS

## 2018-04-19 MED ORDER — FENTANYL CITRATE (PF) 100 MCG/2ML IJ SOLN
INTRAMUSCULAR | Status: AC
  Filled 2018-04-19: qty 2

## 2018-04-19 MED ORDER — KETOROLAC TROMETHAMINE 30 MG/ML IJ SOLN
30.0000 mg | Freq: Once | INTRAMUSCULAR | Status: AC
  Administered 2018-04-19: 30 mg via INTRAVENOUS

## 2018-04-19 NOTE — ED Notes (Signed)
Pt unable to give any more urine at this time.

## 2018-04-19 NOTE — ED Triage Notes (Addendum)
Pt to triage in wheelchair.  Pt has right side flank pain.  Pt vomiting in triage.  Onset 1930 tonight.  Hx kidney stones

## 2018-04-19 NOTE — ED Provider Notes (Signed)
[facility] Emergency Department Provider Note ____________________________________________   First MD Initiated Contact with Patient 04/19/18 2127     (approximate)  I have reviewed the triage vital signs and the nursing notes.   HISTORY  Chief Complaint Abdominal Pain and Emesis  HPI Lisa Stein is a 57 y.o. female with a history of depression as well as past history of kidney stones who is presenting to the emergency department with right flank pain radiating around to her pelvis.  Says that the pain as well as urinary frequency started approximately 4 days ago.  She used an Designer, television/film set "MD live" service and was prescribed Macrobid.  She says that today the pain increased to a severe right-sided flank and abdominal pain.  She states that she had nausea but no vomiting.  Denies any fever chills.  Past Medical History:  Diagnosis Date  . Anxiety   . Depression   . GERD (gastroesophageal reflux disease)   . Lymphedema   . Neuralgia   . Osteoarthritis   . Raynaud disease     Patient Active Problem List   Diagnosis Date Noted  . Community acquired pneumonia 11/24/2016  . Acute respiratory failure with hypoxemia ([place]) 11/19/2016  . Sepsis ([place]) 11/17/2016  . Skin nodule 09/05/2016  . Menopause 07/21/2016  . Clinical depression 01/12/2016  . Difficult or painful urination 03/16/2015  . Fatigue 03/16/2015  . Anxiety, generalized 03/16/2015  . Acid reflux 03/16/2015  . Cardiac murmur 03/16/2015  . H/O major abdominal surgery 03/16/2015  . Calculus of kidney 03/16/2015  . Acquired lymphedema 03/16/2015  . Affective disorder, major 03/16/2015  . Arthritis, degenerative 03/16/2015  . Adiposity 03/16/2015  . Raynaud's syndrome 03/16/2015  . Muscle rigidity 03/16/2015  . Fothergill's neuralgia 03/16/2015  . Avitaminosis D 03/16/2015  . DDD (degenerative disc disease), lumbar 11/20/2014  . Neuritis or radiculitis due to rupture of lumbar  intervertebral disc 11/20/2014  . Lower abdominal pain 08/05/2014  . Diarrhea 08/05/2014    Past Surgical History:  Procedure Laterality Date  . ABDOMINAL HYSTERECTOMY  2003  . ANKLE FRACTURE SURGERY  2010  . Bunionectomy Right   . 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  . COLONOSCOPY  2010   Dr. Vira Agar  . COLONOSCOPY  08-07-14   Dr Bary Castilla  . HEMICOLECTOMY  08/08/14  . REPLACEMENT TOTAL KNEE  2009  . sinus problem  2004   Sinuses opened up  . SPINE SURGERY  08/02/2016   spinal fusion - [facility]  . TOOTH EXTRACTION Right 2016  . TOTAL SHOULDER REPLACEMENT  2008,2009    Prior to Admission medications   Medication Sig Start Date End Date Taking? Authorizing Provider  busPIRone (BUSPAR) 10 MG tablet Take 1 tablet (10 mg total) by mouth 2 (two) times daily. 06/21/17 05/17/18  Jerrol Banana., MD  celecoxib (CELEBREX) 100 MG capsule Take 100 mg by mouth daily.    [provider]  Cholecalciferol (VITAMIN D3) 2000 UNITS TABS Take 2,000 Units by mouth as needed.  04/22/13   [provider]  diazepam (VALIUM) 5 MG tablet Take 5 mg by mouth every 12 (twelve) hours as needed.  04/22/13   [provider]  DiphenhydrAMINE HCl, Sleep, 50 MG CAPS Take 50 mg by mouth at bedtime as needed.  05/31/11   [provider]  estradiol (ESTRACE) 1 MG tablet TAKE 1 TABLET BY MOUTH  DAILY  03/11/18   Jerrol Banana., MD  fluticasone [facility]) 50 MCG/ACT nasal spray Place 1 spray into the nose daily as needed.  12/05/13   [provider]  hydrocortisone 1 % lotion Apply 1 application topically 2 (two) times daily. 03/19/18   Mar Daring, PA-C  omeprazole (PRILOSEC) 20 MG capsule TAKE 1 CAPSULE BY MOUTH TWO TIMES DAILY WITH FOOD 06/21/17   Jerrol Banana., MD  sertraline (ZOLOFT) 100 MG tablet TAKE 2 TABLETS BY MOUTH  DAILY 03/03/18   Jerrol Banana., MD  venlafaxine [facility]) 37.5 MG tablet Take 1 tablet (37.5 mg total) by mouth 2 (two) times daily. Patient not taking: Reported on 11/02/2015 10/06/15 11/30/15  Jerrol Banana., MD    Allergies Benzoin; Biaxin [clarithromycin]; Sulfa antibiotics; Tegretol  [carbamazepine]; and Betadine  [povidone iodine]  Family History  Problem Relation Age of Onset  . Hypertension Mother   . Arthritis Mother   . COPD Mother   . Raynaud syndrome Mother   . Kidney failure Mother   . Heart failure Mother   . Cancer Father        lung & liver cancer  . Raynaud syndrome Sister   . Raynaud syndrome Sister     Social History Social History   Tobacco Use  . Smoking status: Former Smoker    Years: 5.00  . Smokeless tobacco: Never Used  . Tobacco comment: Quit smoking in 1980's  Substance Use Topics  . Alcohol use: Yes    Alcohol/week: 0.6 oz    Types: 1 Glasses of wine per week    Comment: occasional  . Drug use: No    Review of Systems  Constitutional: No fever/chills Eyes: No visual changes. ENT: No sore throat. Cardiovascular: Denies chest pain. Respiratory: Denies shortness of breath. Gastrointestinal: no vomiting.  No diarrhea.  No constipation. Genitourinary: Negative for dysuria. Musculoskeletal: As above Skin: Negative for rash. Neurological: Negative for headaches, focal weakness or numbness.   ____________________________________________   PHYSICAL EXAM:  VITAL SIGNS: ED Triage Vitals  Enc Vitals Group     BP 04/19/18 2123 (!) 160/85     Pulse Rate 04/19/18 2123 64     Resp 04/19/18 2123 20     Temp 04/19/18 2123 99.1 F (37.3 C)     Temp Source 04/19/18 2123 Oral     SpO2 04/19/18 2123 95 %     Weight 04/19/18 2121 276 lb (125.2 kg)     Height 04/19/18 2121 6\' 1"  (1.854 m)     Head Circumference --      Peak Flow --      Pain Score 04/19/18 2121 10     Pain Loc --      Pain Edu? --      Excl. in [place]? --     Constitutional: Alert and oriented.   Appears uncomfortable. Eyes: Conjunctivae are normal.  Head: Atraumatic. Nose: No congestion/rhinnorhea. Mouth/Throat: Mucous membranes are moist.  Neck: No stridor.   Cardiovascular: Normal rate, regular rhythm. Grossly normal heart sounds.  Respiratory: Normal respiratory effort.  No retractions. Lungs CTAB. Gastrointestinal: Soft and nontender. No distention. No CVA tenderness. Musculoskeletal: No lower extremity tenderness nor edema.  No joint effusions. Neurologic:  Normal speech and language. No gross focal neurologic deficits are appreciated. Skin:  Skin is warm, dry and intact. No rash noted. Psychiatric: Mood and affect are normal. Speech and behavior are normal.  ____________________________________________   LABS (all labs ordered are  listed, but only abnormal results are displayed)  Labs Reviewed  COMPREHENSIVE METABOLIC PANEL - Abnormal; Notable for the following components:      Result Value   Glucose, Bld 117 (*)    Creatinine, Ser 1.05 (*)    GFR calc non Af Amer 57 (*)    All other components within normal limits  URINALYSIS, COMPLETE (UACMP) WITH MICROSCOPIC - Abnormal; Notable for the following components:   Color, Urine AMBER (*)    APPearance CLOUDY (*)    Specific Gravity, Urine 1.035 (*)    Hgb urine dipstick MODERATE (*)    Bilirubin Urine SMALL (*)    Ketones, ur 20 (*)    Protein, ur 100 (*)    Squamous Epithelial / LPF >50 (*)    All other components within normal limits  CBC WITH DIFFERENTIAL/PLATELET   ____________________________________________  EKG   ____________________________________________  RADIOLOGY  Right-sided obstructive uropathy secondary to 5 x 8 mm right UVJ as well as 5 x 8 mm UPJ stones.  Moderate hydroureteronephrosis associated. ____________________________________________   PROCEDURES  Procedure(s) performed:   Procedures  Critical Care performed:   ____________________________________________   INITIAL  IMPRESSION / ASSESSMENT AND PLAN / ED COURSE  Pertinent labs & imaging results that were available during my care of the patient were reviewed by me and considered in my medical decision making (see chart for details).  Differential diagnosis includes, but is not limited to, ovarian cyst, ovarian torsion, acute appendicitis, diverticulitis, urinary tract infection/pyelonephritis, endometriosis, bowel obstruction, colitis, renal colic, gastroenteritis, hernia, fibroids, endometriosis, pregnancy related pain including ectopic pregnancy, etc. As part of my medical decision making, I reviewed the following data within the electronic MEDICAL RECORD NUMBER Notes from prior ED visits  ----------------------------------------- 11:27 PM on 04/19/2018 -----------------------------------------  Patient says that her pain is now a 3 out of 10 after being given morphine as well as Toradol.  I discussed the case with Dr. Louis Meckel of urology who states that he will notify his office of the patient may be seen tomorrow.  He says that as long as the patient is comfortable/pain is controlled that she may be discharged home.  I discussed this with the patient and she states that she feels comfortable to be discharged home and says that she will come back for any worsening or concerning symptoms.  We discussed returning for any worsening concerning symptoms especially worsening pain, nausea vomiting or fever.  Otherwise says that she will follow-up tomorrow in the office.  She will be given an additional Percocet before discharge because she is concerned about not being able to get her medications until pharmacies open up tomorrow morning. ____________________________________________   FINAL CLINICAL IMPRESSION(S) / ED DIAGNOSES  Kidney stones.   NEW MEDICATIONS STARTED DURING THIS VISIT:  New Prescriptions   No medications on file     Note:  This document was prepared using Dragon voice recognition software and  may include unintentional dictation errors.     Orbie Pyo, MD 04/19/18 [phone]

## 2018-04-20 ENCOUNTER — Encounter: Payer: Self-pay | Admitting: Urology

## 2018-04-20 ENCOUNTER — Ambulatory Visit: Payer: BLUE CROSS/BLUE SHIELD | Admitting: Urology

## 2018-04-20 ENCOUNTER — Telehealth: Payer: Self-pay | Admitting: Urology

## 2018-04-20 ENCOUNTER — Other Ambulatory Visit: Payer: Self-pay | Admitting: Radiology

## 2018-04-20 VITALS — BP 101/65 | HR 76 | Ht 73.0 in | Wt 276.0 lb

## 2018-04-20 DIAGNOSIS — N201 Calculus of ureter: Secondary | ICD-10-CM

## 2018-04-20 DIAGNOSIS — N200 Calculus of kidney: Secondary | ICD-10-CM | POA: Diagnosis not present

## 2018-04-20 DIAGNOSIS — N132 Hydronephrosis with renal and ureteral calculous obstruction: Secondary | ICD-10-CM | POA: Diagnosis not present

## 2018-04-20 LAB — URINALYSIS, COMPLETE
Bilirubin, UA: NEGATIVE
GLUCOSE, UA: NEGATIVE
KETONES UA: NEGATIVE
Nitrite, UA: NEGATIVE
SPEC GRAV UA: 1.025 (ref 1.005–1.030)
Urobilinogen, Ur: 0.2 mg/dL (ref 0.2–1.0)
pH, UA: 6 (ref 5.0–7.5)

## 2018-04-20 LAB — MICROSCOPIC EXAMINATION: Epithelial Cells (non renal): >10 /hpf — ABNORMAL HIGH (ref 0–10)

## 2018-04-20 MED ORDER — ALFUZOSIN HCL ER 10 MG PO TB24: 10.0000 mg | ORAL_TABLET | Freq: Every day | ORAL | 1 refills | Status: DC

## 2018-04-20 MED ORDER — ONDANSETRON HCL 4 MG/2ML IJ SOLN
INTRAMUSCULAR | Status: AC
  Filled 2018-04-20: qty 2

## 2018-04-20 MED ORDER — OXYCODONE-ACETAMINOPHEN 5-325 MG PO TABS
2.0000 | ORAL_TABLET | Freq: Once | ORAL | Status: AC
  Administered 2018-04-20: 2 via ORAL

## 2018-04-20 MED ORDER — KETOROLAC TROMETHAMINE 60 MG/2ML IM SOLN
60.0000 mg | Freq: Once | INTRAMUSCULAR | Status: AC
  Administered 2018-04-20: 60 mg via INTRAMUSCULAR

## 2018-04-20 MED ORDER — OXYCODONE-ACETAMINOPHEN 5-325 MG PO TABS
ORAL_TABLET | ORAL | Status: AC
  Filled 2018-04-20: qty 2

## 2018-04-20 MED ORDER — ONDANSETRON HCL 4 MG/2ML IJ SOLN
4.0000 mg | INTRAMUSCULAR | Status: AC
  Administered 2018-04-20: 4 mg via INTRAVENOUS

## 2018-04-20 NOTE — Telephone Encounter (Signed)
App made ° ° °Lisa Stein  °

## 2018-04-20 NOTE — Telephone Encounter (Signed)
-----   Message from Ardis Hughs, MD sent at 04/19/2018 10:58 PM EDT ----- Regarding: urgent f/u Patient seen in ED, has two large stones, should be seen ASAP. Seabron Spates

## 2018-04-20 NOTE — Progress Notes (Signed)
04/20/2018 1:48 PM   Lisa Stein July 07, 1961 [id number]  Referring provider: Jerrol Banana., MD [address] [place], [place] [address]  Chief Complaint  Patient presents with  . Nephrolithiasis    HPI:  57 year old referred for ureteral stones.  She was seen yesterday in the emergency department.  She had right flank pain radiating down to the right pelvis for 4 days.  She also had urinary frequency.  She was previously started on nitrofurantoin.  UA showed no bacteria.  CT scan of the abdomen and pelvis was obtained which revealed an 8 mm right UPJ stone and moderate hydronephrosis and a 7 mm right UVJ stone.  The stones were visible on the scout image.  Her white count was 7 and creatinine 1.   Today, she continues to have some pain which improved with toradol. She looks well.   She may have passed a stone once before.   PMH: Past Medical History:  Diagnosis Date  . Anxiety   . Depression   . GERD (gastroesophageal reflux disease)   . Lymphedema   . Neuralgia   . Osteoarthritis   . Raynaud disease     Surgical History: Past Surgical History:  Procedure Laterality Date  . ABDOMINAL HYSTERECTOMY  2003  . ANKLE FRACTURE SURGERY  2010  . Bunionectomy Right   . 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  . COLONOSCOPY  2010   Dr. Vira Agar  . COLONOSCOPY  08-07-14   Dr Bary Castilla  . HEMICOLECTOMY  08/08/14  . REPLACEMENT TOTAL KNEE  2009  . sinus problem  2004   Sinuses opened up  . SPINE SURGERY  08/02/2016   spinal fusion - [facility]  . TOOTH EXTRACTION Right 2016  . TOTAL SHOULDER REPLACEMENT  2008,2009    Home Medications:  Allergies as of 04/20/2018      Reactions   Benzoin    Blisters.   Biaxin [clarithromycin] Diarrhea   GI upset   Sulfa Antibiotics Itching   Tegretol  [carbamazepine] Itching   Betadine  [povidone Iodine] Rash        Medication List        Accurate as of 04/20/18  1:48 PM. Always use your most recent med list.          busPIRone 10 MG tablet Commonly known as:  BUSPAR Take 1 tablet (10 mg total) by mouth 2 (two) times daily.   celecoxib 100 MG capsule Commonly known as:  CELEBREX Take 100 mg by mouth daily.   diazepam 5 MG tablet Commonly known as:  VALIUM Take 5 mg by mouth every 12 (twelve) hours as needed.   diphenhydrAMINE HCl (Sleep) 50 MG Caps Take 50 mg by mouth at bedtime as needed.   estradiol 1 MG tablet Commonly known as:  ESTRACE TAKE 1 TABLET BY MOUTH  DAILY   fluticasone 50 MCG/ACT nasal spray Commonly known as:  FLONASE Place 1 spray into the nose daily as needed.   hydrocortisone 1 % lotion Apply 1 application topically 2 (two) times daily.   omeprazole 20 MG capsule Commonly known as:  PRILOSEC TAKE 1 CAPSULE BY MOUTH TWO TIMES DAILY WITH FOOD   oxyCODONE-acetaminophen 5-325 MG tablet Commonly known as:  PERCOCET Take 1 tablet by mouth every 4 (four) hours as needed for moderate pain or severe pain.   sertraline 100 MG tablet Commonly known as:  ZOLOFT  TAKE 2 TABLETS BY MOUTH  DAILY   Vitamin D3 2000 units Tabs Take 2,000 Units by mouth as needed.       Allergies:  Allergies  Allergen Reactions  . Benzoin     Blisters.  . Biaxin [Clarithromycin] Diarrhea    GI upset  . Sulfa Antibiotics Itching  . Tegretol  [Carbamazepine] Itching  . Betadine  [Povidone Iodine] Rash    Family History: Family History  Problem Relation Age of Onset  . Hypertension Mother   . Arthritis Mother   . COPD Mother   . Raynaud syndrome Mother   . Kidney failure Mother   . Heart failure Mother   . Cancer Father        lung & liver cancer  . Raynaud syndrome Sister   . Raynaud syndrome Sister     Social History:  reports that she has quit smoking. She quit after 5.00 years of use. She has never used smokeless tobacco. She reports that she drinks about 0.6 oz of  alcohol per week. She reports that she does not use drugs.  ROS: UROLOGY Frequent Urination?: No Hard to postpone urination?: No Burning/pain with urination?: No Get up at night to urinate?: No Leakage of urine?: No Urine stream starts and stops?: No Trouble starting stream?: Yes Do you have to strain to urinate?: Yes Blood in urine?: No Urinary tract infection?: No Sexually transmitted disease?: No Injury to kidneys or bladder?: No Painful intercourse?: No Weak stream?: No Currently pregnant?: No Vaginal bleeding?: No Last menstrual period?: n  Gastrointestinal Nausea?: Yes Vomiting?: Yes Indigestion/heartburn?: No Diarrhea?: No Constipation?: No  Constitutional Fever: No Night sweats?: No Weight loss?: No Fatigue?: Yes  Skin Skin rash/lesions?: No Itching?: No  Eyes Blurred vision?: No Double vision?: No  Ears/Nose/Throat Sore throat?: No Sinus problems?: No  Hematologic/Lymphatic Swollen glands?: No Easy bruising?: No  Cardiovascular Leg swelling?: Yes Chest pain?: No  Respiratory Cough?: No Shortness of breath?: No  Endocrine Excessive thirst?: No  Musculoskeletal Back pain?: No Joint pain?: Yes  Neurological Headaches?: No Dizziness?: No  Psychologic Depression?: Yes Anxiety?: Yes  Physical Exam: BP 101/65   Pulse 76   Ht 6\' 1"  (1.854 m)   Wt 125.2 kg (276 lb)   BMI 36.41 kg/m   Constitutional:  Alert and oriented, No acute distress. HEENT: [place] AT, moist mucus membranes.  Trachea midline, no masses. Cardiovascular: No clubbing, cyanosis, or edema. Respiratory: Normal respiratory effort, no increased work of breathing. GI: Abdomen is soft, nontender, nondistended, no abdominal masses GU: No CVA tenderness Skin: No rashes, bruises or suspicious lesions. Neurologic: Grossly intact, no focal deficits, moving all 4 extremities. Psychiatric: Normal mood and affect.  Laboratory Data: Lab Results  Component Value Date   WBC 7.6  04/19/2018   HGB 13.7 04/19/2018   HCT 42.3 04/19/2018   MCV 82.7 04/19/2018   PLT 253 04/19/2018    Lab Results  Component Value Date   CREATININE 1.05 (H) 04/19/2018    No results found for: PSA  No results found for: TESTOSTERONE  Lab Results  Component Value Date   HGBA1C 5.5 10/31/2017    Urinalysis    Component Value Date/Time   COLORURINE AMBER (A) 04/19/2018 2248   APPEARANCEUR CLOUDY (A) 04/19/2018 2248   APPEARANCEUR Clear 04/17/2013 1054   LABSPEC 1.035 (H) 04/19/2018 2248   LABSPEC 1.013 04/17/2013 1054   PHURINE 5.0 04/19/2018 2248   GLUCOSEU NEGATIVE 04/19/2018 2248   GLUCOSEU Negative 04/17/2013 1054  HGBUR MODERATE (A) 04/19/2018 2248   BILIRUBINUR SMALL (A) 04/19/2018 2248   BILIRUBINUR Negative 04/17/2013 1054   KETONESUR 20 (A) 04/19/2018 2248   PROTEINUR 100 (A) 04/19/2018 2248   NITRITE NEGATIVE 04/19/2018 2248   LEUKOCYTESUR NEGATIVE 04/19/2018 2248   LEUKOCYTESUR 2+ 04/17/2013 1054    Lab Results  Component Value Date   BACTERIA NONE SEEN 04/19/2018    Pertinent Imaging: CT No results found for this or any previous visit. No results found for this or any previous visit. No results found for this or any previous visit. No results found for this or any previous visit. No results found for this or any previous visit. No results found for this or any previous visit. No results found for this or any previous visit. Results for orders placed during the hospital encounter of 04/19/18  CT RENAL STONE STUDY   Narrative CLINICAL DATA:  Right-sided flank pain  EXAM: CT ABDOMEN AND PELVIS WITHOUT CONTRAST  TECHNIQUE: Multidetector CT imaging of the abdomen and pelvis was performed following the standard protocol without IV contrast.  COMPARISON:  CT abdomen pelvis 08/04/2014  FINDINGS: LOWER CHEST: No basilar pulmonary nodules or pleural effusion. No apical pericardial effusion.  HEPATOBILIARY: Normal hepatic contours and density.  No intra- or extrahepatic biliary dilatation. Normal gallbladder.  PANCREAS: Normal parenchymal contours without ductal dilatation. No peripancreatic fluid collection.  SPLEEN: Normal.  ADRENALS/URINARY TRACT:  --Adrenal glands: Normal.  --Right kidney/ureter: There is moderate right hydronephrosis and hydroureter. At the ureterovesical junction, there is a stone that measures 8 x 5 mm. At the ureteropelvic junction, there is a stone that measures 8 x 5 mm. There is an additional nonobstructing renal stone in the interpolar region measuring 6 mm.  --Left kidney/ureter: Multiple nonobstructing, small calculi.  --Urinary bladder: Decompressed.  STOMACH/BOWEL:  --Stomach/Duodenum: No hiatal hernia or other gastric abnormality. Normal duodenal course.  --Small bowel: No dilatation or inflammation.  --Colon: No focal abnormality.  --Appendix: Surgically absent.  VASCULAR/LYMPHATIC: Normal course and caliber of the major abdominal vessels. No abdominal or pelvic lymphadenopathy.  REPRODUCTIVE: Status post hysterectomy. No adnexal mass.  MUSCULOSKELETAL. No bony spinal canal stenosis or focal osseous abnormality.  OTHER: None.  IMPRESSION: 1. Right-sided obstructive uropathy secondary to a 5 x 8 mm right ureterovesical junction stone and a 5 x 8 mm ureteropelvic junction stone with moderate hydroureteronephrosis. 2. Additional nonobstructing bilateral nephrolithiasis.   Electronically Signed   By: Ulyses Jarred M.D.   On: 04/19/2018 22:36     Assessment & Plan:    Right ureteral stones -- I drew pt a picture of the anatomy and discussed the nature risks benefits of continued stone passage, off label alpha blocker use, ESWL and URS/HLL/stent. All questions answered. She will start tamsulosin and we'll set her up for right URS/HLL/stent. If she passes a stone she should consider right ESWL for the more proximal. She'll notify us. Discussed with pt and husband any  malaise, N/V, fever go to ED immediately.    No follow-ups on file.  Festus Aloe, MD  [facility] [address] [place], [place] [address] [phone]

## 2018-04-20 NOTE — ED Notes (Signed)
Pt states she is afraid to go home. Dr Karma Greaser notified. Dr Dineen Kid d/c pt. Dr Karma Greaser in to speak with pt.

## 2018-04-20 NOTE — H&P (View-Only) (Signed)
04/20/2018 1:48 PM   Lisa Stein May 28, 1961 [id number]  Referring provider: Jerrol Banana., MD [address] [place], [place] [address]  Chief Complaint  Patient presents with  . Nephrolithiasis    HPI:  58 year old referred for ureteral stones.  She was seen yesterday in the emergency department.  She had right flank pain radiating down to the right pelvis for 4 days.  She also had urinary frequency.  She was previously started on nitrofurantoin.  UA showed no bacteria.  CT scan of the abdomen and pelvis was obtained which revealed an 8 mm right UPJ stone and moderate hydronephrosis and a 7 mm right UVJ stone.  The stones were visible on the scout image.  Her white count was 7 and creatinine 1.   Today, she continues to have some pain which improved with toradol. She looks well.   She may have passed a stone once before.   PMH: Past Medical History:  Diagnosis Date  . Anxiety   . Depression   . GERD (gastroesophageal reflux disease)   . Lymphedema   . Neuralgia   . Osteoarthritis   . Raynaud disease     Surgical History: Past Surgical History:  Procedure Laterality Date  . ABDOMINAL HYSTERECTOMY  2003  . ANKLE FRACTURE SURGERY  2010  . Bunionectomy Right   . 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  . COLONOSCOPY  2010   Dr. Vira Agar  . COLONOSCOPY  08-07-14   Dr Bary Castilla  . HEMICOLECTOMY  08/08/14  . REPLACEMENT TOTAL KNEE  2009  . sinus problem  2004   Sinuses opened up  . SPINE SURGERY  08/02/2016   spinal fusion - [facility]  . TOOTH EXTRACTION Right 2016  . TOTAL SHOULDER REPLACEMENT  2008,2009    Home Medications:  Allergies as of 04/20/2018      Reactions   Benzoin    Blisters.   Biaxin [clarithromycin] Diarrhea   GI upset   Sulfa Antibiotics Itching   Tegretol  [carbamazepine] Itching   Betadine  [povidone Iodine] Rash        Medication List        Accurate as of 04/20/18  1:48 PM. Always use your most recent med list.          busPIRone 10 MG tablet Commonly known as:  BUSPAR Take 1 tablet (10 mg total) by mouth 2 (two) times daily.   celecoxib 100 MG capsule Commonly known as:  CELEBREX Take 100 mg by mouth daily.   diazepam 5 MG tablet Commonly known as:  VALIUM Take 5 mg by mouth every 12 (twelve) hours as needed.   diphenhydrAMINE HCl (Sleep) 50 MG Caps Take 50 mg by mouth at bedtime as needed.   estradiol 1 MG tablet Commonly known as:  ESTRACE TAKE 1 TABLET BY MOUTH  DAILY   fluticasone 50 MCG/ACT nasal spray Commonly known as:  FLONASE Place 1 spray into the nose daily as needed.   hydrocortisone 1 % lotion Apply 1 application topically 2 (two) times daily.   omeprazole 20 MG capsule Commonly known as:  PRILOSEC TAKE 1 CAPSULE BY MOUTH TWO TIMES DAILY WITH FOOD   oxyCODONE-acetaminophen 5-325 MG tablet Commonly known as:  PERCOCET Take 1 tablet by mouth every 4 (four) hours as needed for moderate pain or severe pain.   sertraline 100 MG tablet Commonly known as:  ZOLOFT  TAKE 2 TABLETS BY MOUTH  DAILY   Vitamin D3 2000 units Tabs Take 2,000 Units by mouth as needed.       Allergies:  Allergies  Allergen Reactions  . Benzoin     Blisters.  . Biaxin [Clarithromycin] Diarrhea    GI upset  . Sulfa Antibiotics Itching  . Tegretol  [Carbamazepine] Itching  . Betadine  [Povidone Iodine] Rash    Family History: Family History  Problem Relation Age of Onset  . Hypertension Mother   . Arthritis Mother   . COPD Mother   . Raynaud syndrome Mother   . Kidney failure Mother   . Heart failure Mother   . Cancer Father        lung & liver cancer  . Raynaud syndrome Sister   . Raynaud syndrome Sister     Social History:  reports that she has quit smoking. She quit after 5.00 years of use. She has never used smokeless tobacco. She reports that she drinks about 0.6 oz of  alcohol per week. She reports that she does not use drugs.  ROS: UROLOGY Frequent Urination?: No Hard to postpone urination?: No Burning/pain with urination?: No Get up at night to urinate?: No Leakage of urine?: No Urine stream starts and stops?: No Trouble starting stream?: Yes Do you have to strain to urinate?: Yes Blood in urine?: No Urinary tract infection?: No Sexually transmitted disease?: No Injury to kidneys or bladder?: No Painful intercourse?: No Weak stream?: No Currently pregnant?: No Vaginal bleeding?: No Last menstrual period?: n  Gastrointestinal Nausea?: Yes Vomiting?: Yes Indigestion/heartburn?: No Diarrhea?: No Constipation?: No  Constitutional Fever: No Night sweats?: No Weight loss?: No Fatigue?: Yes  Skin Skin rash/lesions?: No Itching?: No  Eyes Blurred vision?: No Double vision?: No  Ears/Nose/Throat Sore throat?: No Sinus problems?: No  Hematologic/Lymphatic Swollen glands?: No Easy bruising?: No  Cardiovascular Leg swelling?: Yes Chest pain?: No  Respiratory Cough?: No Shortness of breath?: No  Endocrine Excessive thirst?: No  Musculoskeletal Back pain?: No Joint pain?: Yes  Neurological Headaches?: No Dizziness?: No  Psychologic Depression?: Yes Anxiety?: Yes  Physical Exam: BP 101/65   Pulse 76   Ht 6\' 1"  (1.854 m)   Wt 125.2 kg (276 lb)   BMI 36.41 kg/m   Constitutional:  Alert and oriented, No acute distress. HEENT: [place] AT, moist mucus membranes.  Trachea midline, no masses. Cardiovascular: No clubbing, cyanosis, or edema. Respiratory: Normal respiratory effort, no increased work of breathing. GI: Abdomen is soft, nontender, nondistended, no abdominal masses GU: No CVA tenderness Skin: No rashes, bruises or suspicious lesions. Neurologic: Grossly intact, no focal deficits, moving all 4 extremities. Psychiatric: Normal mood and affect.  Laboratory Data: Lab Results  Component Value Date   WBC 7.6  04/19/2018   HGB 13.7 04/19/2018   HCT 42.3 04/19/2018   MCV 82.7 04/19/2018   PLT 253 04/19/2018    Lab Results  Component Value Date   CREATININE 1.05 (H) 04/19/2018    No results found for: PSA  No results found for: TESTOSTERONE  Lab Results  Component Value Date   HGBA1C 5.5 10/31/2017    Urinalysis    Component Value Date/Time   COLORURINE AMBER (A) 04/19/2018 2248   APPEARANCEUR CLOUDY (A) 04/19/2018 2248   APPEARANCEUR Clear 04/17/2013 1054   LABSPEC 1.035 (H) 04/19/2018 2248   LABSPEC 1.013 04/17/2013 1054   PHURINE 5.0 04/19/2018 2248   GLUCOSEU NEGATIVE 04/19/2018 2248   GLUCOSEU Negative 04/17/2013 1054  HGBUR MODERATE (A) 04/19/2018 2248   BILIRUBINUR SMALL (A) 04/19/2018 2248   BILIRUBINUR Negative 04/17/2013 1054   KETONESUR 20 (A) 04/19/2018 2248   PROTEINUR 100 (A) 04/19/2018 2248   NITRITE NEGATIVE 04/19/2018 2248   LEUKOCYTESUR NEGATIVE 04/19/2018 2248   LEUKOCYTESUR 2+ 04/17/2013 1054    Lab Results  Component Value Date   BACTERIA NONE SEEN 04/19/2018    Pertinent Imaging: CT No results found for this or any previous visit. No results found for this or any previous visit. No results found for this or any previous visit. No results found for this or any previous visit. No results found for this or any previous visit. No results found for this or any previous visit. No results found for this or any previous visit. Results for orders placed during the hospital encounter of 04/19/18  CT RENAL STONE STUDY   Narrative CLINICAL DATA:  Right-sided flank pain  EXAM: CT ABDOMEN AND PELVIS WITHOUT CONTRAST  TECHNIQUE: Multidetector CT imaging of the abdomen and pelvis was performed following the standard protocol without IV contrast.  COMPARISON:  CT abdomen pelvis 08/04/2014  FINDINGS: LOWER CHEST: No basilar pulmonary nodules or pleural effusion. No apical pericardial effusion.  HEPATOBILIARY: Normal hepatic contours and density.  No intra- or extrahepatic biliary dilatation. Normal gallbladder.  PANCREAS: Normal parenchymal contours without ductal dilatation. No peripancreatic fluid collection.  SPLEEN: Normal.  ADRENALS/URINARY TRACT:  --Adrenal glands: Normal.  --Right kidney/ureter: There is moderate right hydronephrosis and hydroureter. At the ureterovesical junction, there is a stone that measures 8 x 5 mm. At the ureteropelvic junction, there is a stone that measures 8 x 5 mm. There is an additional nonobstructing renal stone in the interpolar region measuring 6 mm.  --Left kidney/ureter: Multiple nonobstructing, small calculi.  --Urinary bladder: Decompressed.  STOMACH/BOWEL:  --Stomach/Duodenum: No hiatal hernia or other gastric abnormality. Normal duodenal course.  --Small bowel: No dilatation or inflammation.  --Colon: No focal abnormality.  --Appendix: Surgically absent.  VASCULAR/LYMPHATIC: Normal course and caliber of the major abdominal vessels. No abdominal or pelvic lymphadenopathy.  REPRODUCTIVE: Status post hysterectomy. No adnexal mass.  MUSCULOSKELETAL. No bony spinal canal stenosis or focal osseous abnormality.  OTHER: None.  IMPRESSION: 1. Right-sided obstructive uropathy secondary to a 5 x 8 mm right ureterovesical junction stone and a 5 x 8 mm ureteropelvic junction stone with moderate hydroureteronephrosis. 2. Additional nonobstructing bilateral nephrolithiasis.   Electronically Signed   By: Ulyses Jarred M.D.   On: 04/19/2018 22:36     Assessment & Plan:    Right ureteral stones -- I drew pt a picture of the anatomy and discussed the nature risks benefits of continued stone passage, off label alpha blocker use, ESWL and URS/HLL/stent. All questions answered. She will start tamsulosin and we'll set her up for right URS/HLL/stent. If she passes a stone she should consider right ESWL for the more proximal. She'll notify us. Discussed with pt and husband any  malaise, N/V, fever go to ED immediately.    No follow-ups on file.  Festus Aloe, MD  [facility] [address], [place], [place] [address] [phone]

## 2018-04-23 ENCOUNTER — Other Ambulatory Visit: Payer: Self-pay

## 2018-04-23 ENCOUNTER — Other Ambulatory Visit: Payer: Self-pay | Admitting: Radiology

## 2018-04-23 ENCOUNTER — Telehealth: Payer: Self-pay | Admitting: Urology

## 2018-04-23 ENCOUNTER — Ambulatory Visit
Admission: RE | Admit: 2018-04-23 | Discharge: 2018-04-23 | Disposition: A | Discharge status: Home / Self Care | Payer: BLUE CROSS/BLUE SHIELD | Source: Ambulatory Visit | Attending: Urology | Admitting: Urology

## 2018-04-23 DIAGNOSIS — N201 Calculus of ureter: Secondary | ICD-10-CM

## 2018-04-23 DIAGNOSIS — N202 Calculus of kidney with calculus of ureter: Secondary | ICD-10-CM | POA: Diagnosis not present

## 2018-04-23 NOTE — Telephone Encounter (Signed)
Patient states she thinks she passed one of the ureteral stones seen on CT done 04/19/2018. Per Zara Council, advised patient to get a KUB and she will be called with results & to discuss plan of care. Patient voices understanding.

## 2018-04-23 NOTE — Telephone Encounter (Signed)
Pt LMOM stating she "thinks" she passed 1 of her stones, she's not positive.  Pt asking for someone to call her back to explain what's next, pt wasn't very descriptive in her message. I apologize. Please advise pt. Thanks.

## 2018-04-24 ENCOUNTER — Other Ambulatory Visit: Payer: BLUE CROSS/BLUE SHIELD

## 2018-04-24 ENCOUNTER — Other Ambulatory Visit: Payer: Self-pay | Admitting: Radiology

## 2018-04-25 ENCOUNTER — Encounter: Admission: RE | Payer: Self-pay | Source: Ambulatory Visit

## 2018-04-25 ENCOUNTER — Ambulatory Visit: Admission: RE | Admit: 2018-04-25 | Payer: BLUE CROSS/BLUE SHIELD | Source: Ambulatory Visit | Admitting: Urology

## 2018-04-25 SURGERY — CYSTOSCOPY/URETEROSCOPY/HOLMIUM LASER/STENT PLACEMENT
Anesthesia: Choice | Laterality: Right

## 2018-04-25 MED ORDER — CIPROFLOXACIN HCL 500 MG PO TABS
500.0000 mg | ORAL_TABLET | ORAL | Status: AC
  Administered 2018-04-26: 500 mg via ORAL

## 2018-04-26 ENCOUNTER — Other Ambulatory Visit: Payer: Self-pay

## 2018-04-26 ENCOUNTER — Encounter
Admission: RE | Disposition: A | Discharge status: Home / Self Care | Payer: Self-pay | Source: Ambulatory Visit | Attending: Urology

## 2018-04-26 ENCOUNTER — Encounter: Payer: Self-pay | Admitting: *Deleted

## 2018-04-26 ENCOUNTER — Ambulatory Visit: Payer: BLUE CROSS/BLUE SHIELD

## 2018-04-26 ENCOUNTER — Ambulatory Visit
Admission: RE | Admit: 2018-04-26 | Discharge: 2018-04-26 | Disposition: A | Discharge status: Home / Self Care | Payer: BLUE CROSS/BLUE SHIELD | Source: Ambulatory Visit | Attending: Urology | Admitting: Urology

## 2018-04-26 DIAGNOSIS — Z791 Long term (current) use of non-steroidal anti-inflammatories (NSAID): Secondary | ICD-10-CM | POA: Diagnosis not present

## 2018-04-26 DIAGNOSIS — N132 Hydronephrosis with renal and ureteral calculous obstruction: Secondary | ICD-10-CM | POA: Insufficient documentation

## 2018-04-26 DIAGNOSIS — Z6836 Body mass index (BMI) 36.0-36.9, adult: Secondary | ICD-10-CM | POA: Insufficient documentation

## 2018-04-26 DIAGNOSIS — K219 Gastro-esophageal reflux disease without esophagitis: Secondary | ICD-10-CM | POA: Diagnosis not present

## 2018-04-26 DIAGNOSIS — N201 Calculus of ureter: Secondary | ICD-10-CM | POA: Diagnosis not present

## 2018-04-26 DIAGNOSIS — Z9049 Acquired absence of other specified parts of digestive tract: Secondary | ICD-10-CM | POA: Insufficient documentation

## 2018-04-26 DIAGNOSIS — Z87891 Personal history of nicotine dependence: Secondary | ICD-10-CM | POA: Insufficient documentation

## 2018-04-26 DIAGNOSIS — N200 Calculus of kidney: Secondary | ICD-10-CM | POA: Diagnosis not present

## 2018-04-26 DIAGNOSIS — Z79890 Hormone replacement therapy (postmenopausal): Secondary | ICD-10-CM | POA: Diagnosis not present

## 2018-04-26 DIAGNOSIS — F419 Anxiety disorder, unspecified: Secondary | ICD-10-CM | POA: Insufficient documentation

## 2018-04-26 DIAGNOSIS — E669 Obesity, unspecified: Secondary | ICD-10-CM | POA: Diagnosis not present

## 2018-04-26 DIAGNOSIS — Z96659 Presence of unspecified artificial knee joint: Secondary | ICD-10-CM | POA: Diagnosis not present

## 2018-04-26 DIAGNOSIS — F329 Major depressive disorder, single episode, unspecified: Secondary | ICD-10-CM | POA: Insufficient documentation

## 2018-04-26 DIAGNOSIS — Z79899 Other long term (current) drug therapy: Secondary | ICD-10-CM | POA: Diagnosis not present

## 2018-04-26 HISTORY — PX: EXTRACORPOREAL SHOCK WAVE LITHOTRIPSY: SHX1557

## 2018-04-26 HISTORY — DX: Pneumonia, unspecified organism: J18.9

## 2018-04-26 HISTORY — DX: Personal history of urinary calculi: Z87.442

## 2018-04-26 LAB — CULTURE, URINE COMPREHENSIVE

## 2018-04-26 SURGERY — LITHOTRIPSY, ESWL
Anesthesia: Moderate Sedation | Laterality: Right

## 2018-04-26 MED ORDER — CIPROFLOXACIN HCL 500 MG PO TABS
ORAL_TABLET | ORAL | Status: AC
  Administered 2018-04-26: 500 mg via ORAL
  Filled 2018-04-26: qty 1

## 2018-04-26 MED ORDER — DIPHENHYDRAMINE HCL 25 MG PO CAPS
ORAL_CAPSULE | ORAL | Status: AC
  Administered 2018-04-26: 25 mg via ORAL
  Filled 2018-04-26: qty 1

## 2018-04-26 MED ORDER — DIPHENHYDRAMINE HCL 25 MG PO CAPS
25.0000 mg | ORAL_CAPSULE | ORAL | Status: AC
  Administered 2018-04-26: 25 mg via ORAL

## 2018-04-26 MED ORDER — ONDANSETRON HCL 4 MG/2ML IJ SOLN
4.0000 mg | Freq: Once | INTRAMUSCULAR | Status: AC | PRN
  Administered 2018-04-26: 4 mg via INTRAVENOUS

## 2018-04-26 MED ORDER — DIAZEPAM 5 MG PO TABS
ORAL_TABLET | ORAL | Status: AC
  Administered 2018-04-26: 10 mg via ORAL
  Filled 2018-04-26: qty 2

## 2018-04-26 MED ORDER — TAMSULOSIN HCL 0.4 MG PO CAPS: 0.4000 mg | ORAL_CAPSULE | Freq: Every day | ORAL | 0 refills | Status: DC

## 2018-04-26 MED ORDER — DIAZEPAM 5 MG PO TABS
10.0000 mg | ORAL_TABLET | ORAL | Status: AC
  Administered 2018-04-26: 10 mg via ORAL

## 2018-04-26 MED ORDER — OXYCODONE-ACETAMINOPHEN 5-325 MG PO TABS: 1.0000 | ORAL_TABLET | ORAL | 0 refills | Status: DC | PRN

## 2018-04-26 MED ORDER — SODIUM CHLORIDE 0.9 % IV SOLN
INTRAVENOUS | Status: DC
  Administered 2018-04-26: 12:00:00 via INTRAVENOUS

## 2018-04-26 MED ORDER — ONDANSETRON HCL 4 MG/2ML IJ SOLN
INTRAMUSCULAR | Status: AC
  Administered 2018-04-26: 4 mg via INTRAVENOUS
  Filled 2018-04-26: qty 2

## 2018-04-26 NOTE — Interval H&P Note (Signed)
History and Physical Interval Note:  04/26/2018 2:20 PM  Lisa Stein  has presented today for surgery, with the diagnosis of Kidney Stone  The various methods of treatment have been discussed with the patient and family. After consideration of risks, benefits and other options for treatment, the patient has consented to  Procedure(s): EXTRACORPOREAL SHOCK WAVE LITHOTRIPSY (ESWL) (Right) as a surgical intervention .  The patient's history has been reviewed, patient examined, no change in status, stable for surgery.  I have reviewed the patient's chart and labs.  Questions were answered to the patient's satisfaction.     Hollice Espy

## 2018-04-26 NOTE — OR Nursing (Signed)
PT ADVISES CAN'T TAKE FLOMAX DUE TO SULFA ALLERGY, BUT TAKES UROXATRAL SHE ALREADY HAS AT HOME, SO WILL NOT GER RX FOR FLOMAX FILLED.

## 2018-04-26 NOTE — Discharge Instructions (Addendum)
See [facility] discharge instructions in chart.  AMBULATORY SURGERY  DISCHARGE INSTRUCTIONS   1) The drugs that you were given will stay in your system until tomorrow so for the next 24 hours you should not:  A) Drive an automobile B) Make any legal decisions C) Drink any alcoholic beverage   2) You may resume regular meals tomorrow.  Today it is better to start with liquids and gradually work up to solid foods.  You may eat anything you prefer, but it is better to start with liquids, then soup and crackers, and gradually work up to solid foods.   3) Please notify your doctor immediately if you have any unusual bleeding, trouble breathing, redness and pain at the surgery site, drainage, fever, or pain not relieved by medication.    4) Additional Instructions:        Please contact your physician with any problems or Same Day Surgery at [phone], Monday through Friday 6 am to 4 pm, or [place] [facility] Main number at [phone].  

## 2018-04-27 ENCOUNTER — Encounter: Payer: Self-pay | Admitting: Urology

## 2018-05-18 ENCOUNTER — Ambulatory Visit: Payer: BLUE CROSS/BLUE SHIELD

## 2018-05-23 ENCOUNTER — Ambulatory Visit: Payer: BLUE CROSS/BLUE SHIELD | Admitting: Urology

## 2018-05-29 ENCOUNTER — Encounter: Payer: Self-pay | Admitting: Family Medicine

## 2018-06-06 ENCOUNTER — Other Ambulatory Visit: Payer: Self-pay | Admitting: Family Medicine

## 2018-06-09 IMAGING — CR DG CHEST 2V
2 series · 3 of 3 positions shown · non-contrast
Comparison: PA and lateral chest x-ray April 17, 2013

CLINICAL DATA: Onset of shaking chills and nausea in the night.
Right ribcage discomfort today. History of gastroesophageal reflux,
Lidia disease, former smoker.

EXAM:
CHEST  2 VIEW

[Series 2: chest lat · 0.14mm/px · 2 of 2 slices shown]
[im 1/2]
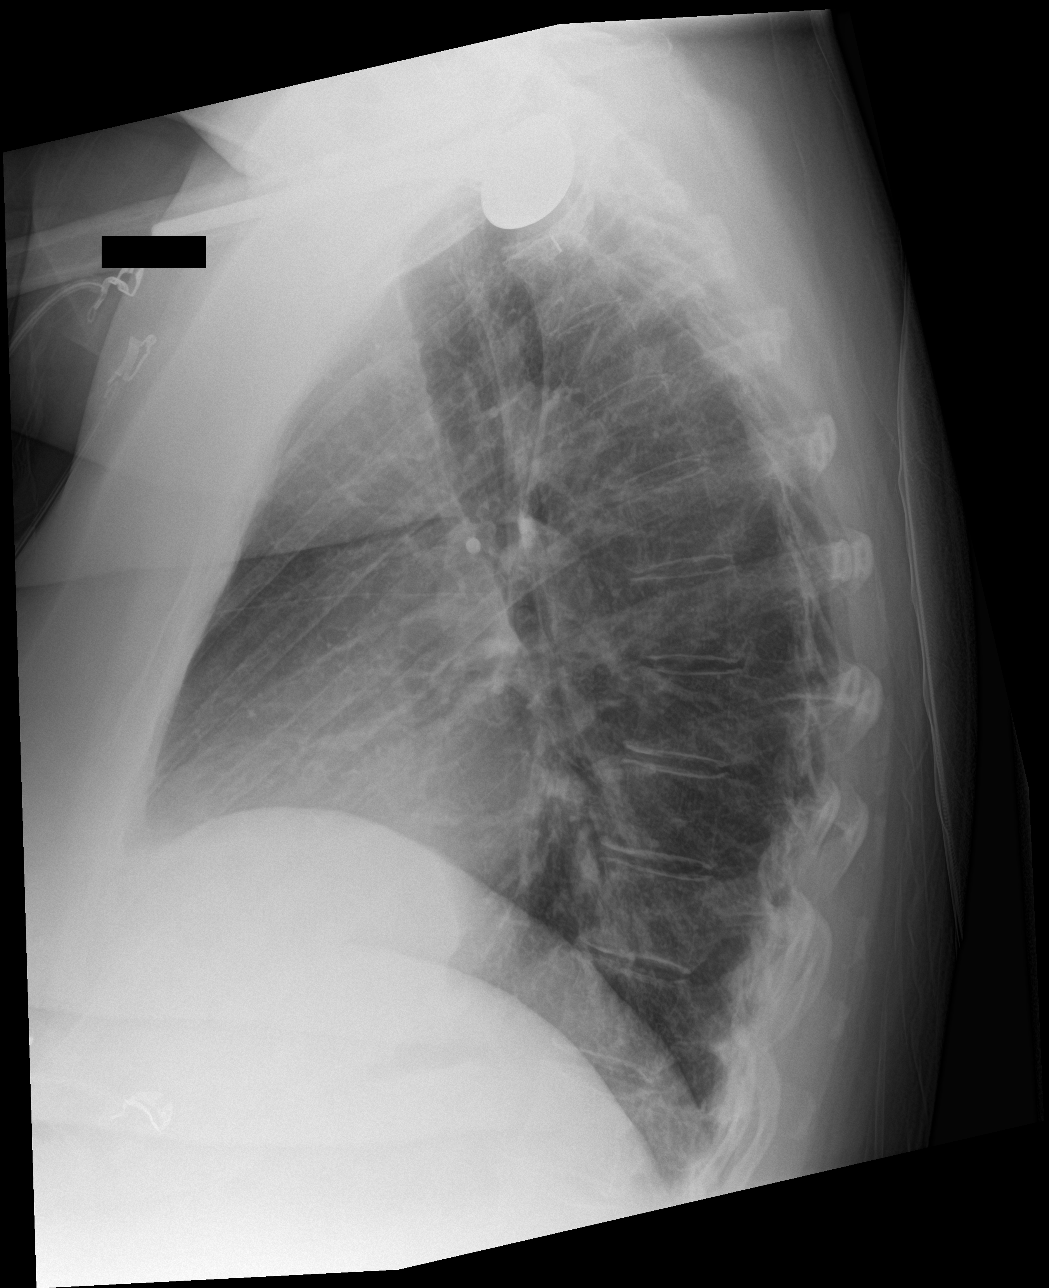
[im 2/2]
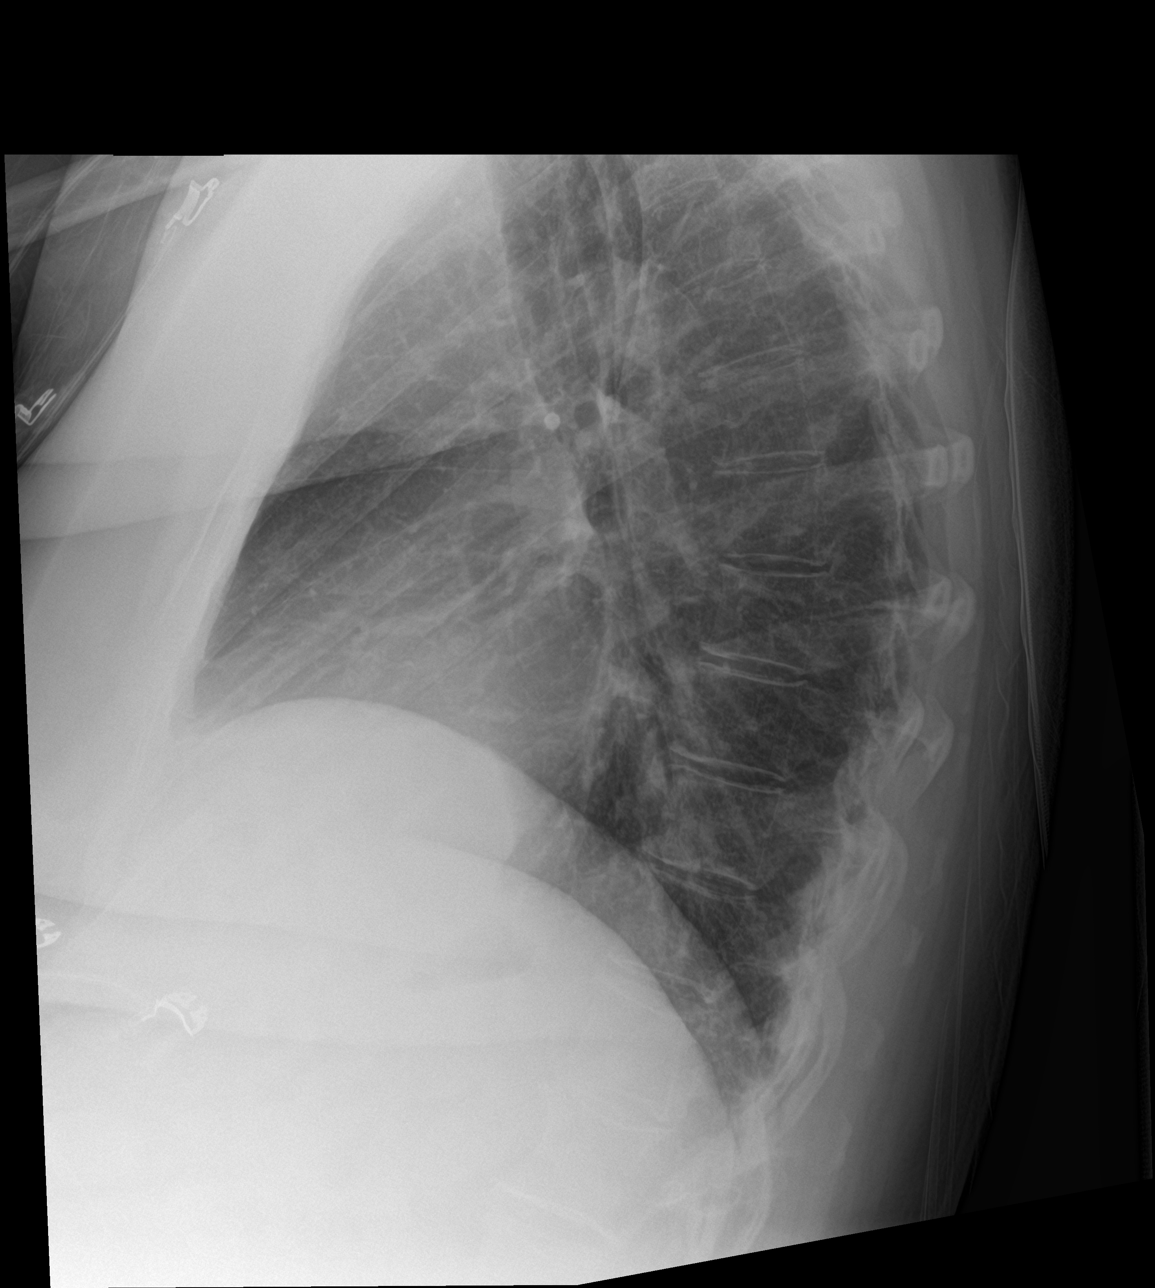

[chest ap]
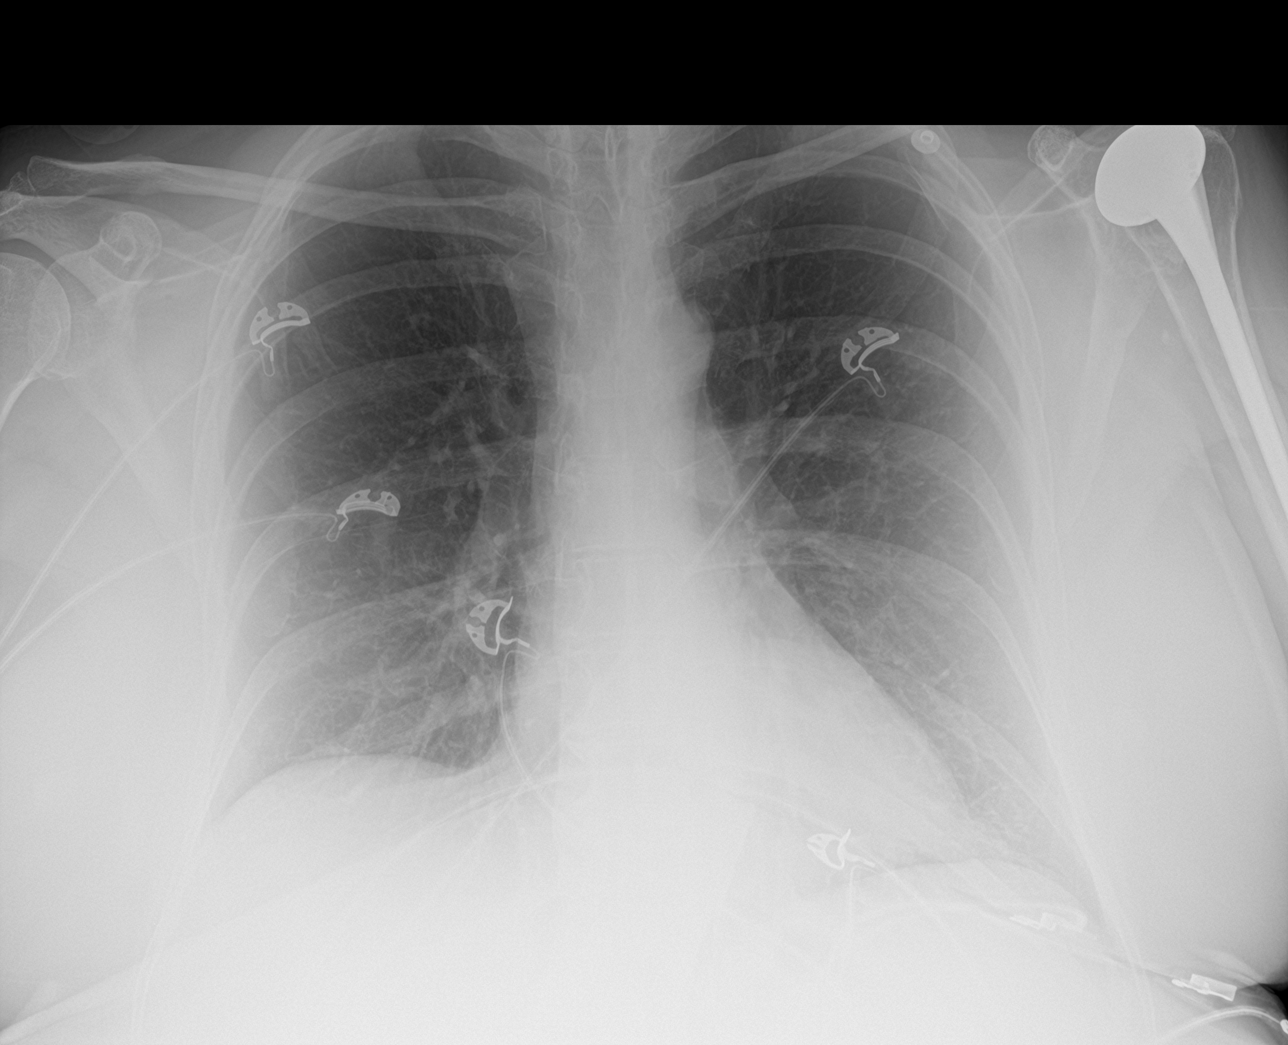

[3 of 3 positions shown; findings below may reference images not displayed]

FINDINGS: The lungs remain mildly hyperinflated. The lung markings are coarse
over the lower thoracic spine which likely lies in the left lower
lobe. The heart and pulmonary vascularity are normal. The
mediastinum is normal in width. The trachea is midline. There is no
pleural effusion. The bony thorax exhibits no acute abnormality.
IMPRESSION: Increased density in the left lower lobe compatible with atelectasis
or pneumonia. No CHF.

Followup PA and lateral chest X-ray is recommended in 3-4 weeks
following trial of antibiotic therapy to ensure resolution.

## 2018-07-10 ENCOUNTER — Encounter: Payer: Self-pay | Admitting: Family Medicine

## 2018-07-17 ENCOUNTER — Encounter: Payer: Self-pay | Admitting: Family Medicine

## 2018-07-17 ENCOUNTER — Ambulatory Visit: Payer: BLUE CROSS/BLUE SHIELD | Admitting: Family Medicine

## 2018-07-17 VITALS — BP 122/78 | HR 76 | Resp 16 | Ht 73.0 in | Wt 276.0 lb

## 2018-07-17 DIAGNOSIS — M7072 Other bursitis of hip, left hip: Secondary | ICD-10-CM

## 2018-07-17 DIAGNOSIS — M7071 Other bursitis of hip, right hip: Secondary | ICD-10-CM

## 2018-07-17 MED ORDER — PREDNISONE 10 MG (21) PO TBPK
ORAL_TABLET | ORAL | 0 refills | Status: DC
Start: 2018-07-17 — End: 2018-11-07

## 2018-07-17 MED ORDER — METHYLPREDNISOLONE ACETATE 80 MG/ML IJ SUSP: 80.0000 mg | Freq: Once | INTRAMUSCULAR | Status: AC

## 2018-07-17 NOTE — Progress Notes (Signed)
Patient: Lisa Stein Female    DOB: 05/23/1961   57 y.o.   MRN: [id number] Visit Date: 07/17/2018  Today's Provider: [name], MD   Chief Complaint  Patient presents with  . Hip Pain   Subjective:    Hip Pain   There was no injury mechanism. The pain is present in the left hip and right hip. The quality of the pain is described as burning and aching. The pain is moderate. The pain has been constant since onset. Associated symptoms include an inability to bear weight. Pertinent negatives include no numbness or tingling. She has tried acetaminophen for the symptoms. The treatment provided mild relief.  Patient is getting ready to go to [facility] with family and extended family and wishes to be able to move around some.     Allergies  Allergen Reactions  . Benzoin     Blisters.  . Biaxin [Clarithromycin] Diarrhea    GI upset  . Sulfa Antibiotics Itching  . Tegretol  [Carbamazepine] Itching  . Betadine  [Povidone Iodine] Rash     Current Outpatient Medications:  .  celecoxib (CELEBREX) 100 MG capsule, Take 100 mg by mouth daily., Disp: , Rfl:  .  Cholecalciferol (VITAMIN D3) 2000 UNITS TABS, Take 2,000 Units by mouth as needed. , Disp: , Rfl:  .  diazepam (VALIUM) 5 MG tablet, Take 5 mg by mouth every 12 (twelve) hours as needed. , Disp: , Rfl:  .  DiphenhydrAMINE HCl, Sleep, 50 MG CAPS, Take 50 mg by mouth at bedtime as needed. , Disp: , Rfl:  .  estradiol (ESTRACE) 1 MG tablet, TAKE 1 TABLET BY MOUTH  DAILY, Disp: 90 tablet, Rfl: 3 .  fluticasone (FLONASE) 50 MCG/ACT nasal spray, Place 1 spray into the nose daily as needed. , Disp: , Rfl:  .  hydrocortisone 1 % lotion, Apply 1 application topically 2 (two) times daily., Disp: 118 mL, Rfl: 0 .  alfuzosin (UROXATRAL) 10 MG 24 hr tablet, Take 1 tablet (10 mg total) by mouth daily with breakfast., Disp: 30 tablet, Rfl: 1 .  omeprazole (PRILOSEC) 20 MG capsule, TAKE 1 CAPSULE BY MOUTH TWO TIMES DAILY WITH FOOD,  Disp: 180 capsule, Rfl: 3 .  oxyCODONE-acetaminophen (PERCOCET) 5-325 MG tablet, Take 1 tablet by mouth every 4 (four) hours as needed for moderate pain or severe pain., Disp: 10 tablet, Rfl: 0 .  sertraline (ZOLOFT) 100 MG tablet, TAKE 2 TABLETS BY MOUTH  DAILY, Disp: 180 tablet, Rfl: 3 .  tamsulosin (FLOMAX) 0.4 MG CAPS capsule, Take 1 capsule (0.4 mg total) by mouth daily., Disp: 30 capsule, Rfl: 0  Review of Systems  Constitutional: Negative for activity change and fatigue.  HENT: Negative.   Eyes: Negative.   Respiratory: Negative.   Cardiovascular: Negative for chest pain, palpitations and leg swelling.  Gastrointestinal: Negative.   Musculoskeletal: Positive for arthralgias and myalgias.  Skin: Negative.   Allergic/Immunologic: Negative.   Neurological: Negative for tingling, weakness and numbness.  Psychiatric/Behavioral: Negative.     Social History   Tobacco Use  . Smoking status: Former Smoker    Years: 5.00  . Smokeless tobacco: Never Used  . Tobacco comment: Quit smoking in 1980's  Substance Use Topics  . Alcohol use: Yes    Alcohol/week: 1.0 standard drinks    Types: 1 Glasses of wine per week    Comment: occasional   Objective:   BP 122/78 (BP Location: Right Arm, Patient Position: Sitting, Cuff  Size: Large)   Pulse 76   Resp 16   Ht 6\' 1"  (1.854 m)   Wt 276 lb (125.2 kg)   SpO2 96%   BMI 36.41 kg/m  Vitals:   07/17/18 1449  BP: 122/78  Pulse: 76  Resp: 16  SpO2: 96%  Weight: 276 lb (125.2 kg)  Height: 6\' 1"  (1.854 m)     Physical Exam  Constitutional: She is oriented to person, place, and time. She appears well-developed and well-nourished.  HENT:  Head: Normocephalic and atraumatic.  Eyes: Conjunctivae are normal. No scleral icterus.  Neck: No thyromegaly present.  Cardiovascular: Normal rate, regular rhythm and normal heart sounds.  Pulmonary/Chest: Effort normal and breath sounds normal.  Abdominal: Soft.  Musculoskeletal: She exhibits  no edema.  She has tender numbness over the great trochanter bursa of both hips, left greater than right.  Neurological: She is alert and oriented to person, place, and time.  Skin: Skin is warm and dry.  Psychiatric: She has a normal mood and affect. Her behavior is normal. Judgment and thought content normal.        Assessment & Plan:     1. Bursitis of both hips, unspecified bursa May need orthopedic referral when she gets back from her trip. - predniSONE (STERAPRED UNI-PAK 21 TAB) 10 MG (21) TBPK tablet; Taper as directed.  Dispense: 21 tablet; Refill: 0 - methylPREDNISolone acetate (DEPO-MEDROL) injection 80 mg     I have done the exam and reviewed the chart and it is accurate to the best of my knowledge. Development worker, community has been used and  any errors in dictation or transcription are unintentional. Miguel Aschoff M.D. [place], MD  [facility]

## 2018-07-23 DIAGNOSIS — Z1231 Encounter for screening mammogram for malignant neoplasm of breast: Secondary | ICD-10-CM | POA: Diagnosis not present

## 2018-07-23 DIAGNOSIS — Z1239 Encounter for other screening for malignant neoplasm of breast: Secondary | ICD-10-CM | POA: Diagnosis not present

## 2018-07-23 LAB — HM MAMMOGRAPHY

## 2018-07-25 ENCOUNTER — Other Ambulatory Visit: Payer: Self-pay | Admitting: Family Medicine

## 2018-08-21 ENCOUNTER — Ambulatory Visit: Payer: BLUE CROSS/BLUE SHIELD | Admitting: Family Medicine

## 2018-08-21 VITALS — BP 130/86 | HR 83 | Temp 98.0°F | Resp 16 | Wt 275.0 lb

## 2018-08-21 DIAGNOSIS — E6609 Other obesity due to excess calories: Secondary | ICD-10-CM

## 2018-08-21 DIAGNOSIS — G8929 Other chronic pain: Secondary | ICD-10-CM | POA: Diagnosis not present

## 2018-08-21 DIAGNOSIS — F325 Major depressive disorder, single episode, in full remission: Secondary | ICD-10-CM | POA: Diagnosis not present

## 2018-08-21 DIAGNOSIS — M1990 Unspecified osteoarthritis, unspecified site: Secondary | ICD-10-CM | POA: Diagnosis not present

## 2018-08-21 DIAGNOSIS — Z6837 Body mass index (BMI) 37.0-37.9, adult: Secondary | ICD-10-CM

## 2018-08-21 MED ORDER — AMITRIPTYLINE HCL 25 MG PO TABS: 25.0000 mg | ORAL_TABLET | Freq: Every day | ORAL | 12 refills | Status: DC

## 2018-08-21 NOTE — Progress Notes (Signed)
Lisa Stein  MRN: [id number] DOB: 13-Jan-1961  Subjective:  HPI   The patient is a 57 year old female who presents for follow up of her depression.  She was last seen for this on 02/14/18.  She has however, had visits for other complaints.   On her last visit the patient was 75% better.  She went from a 17 to a 1 on her PHQ9.  She reports today that she is still doing well.  She has recently returned from a family trip to [place].  Patient Active Problem List   Diagnosis Date Noted  . Community acquired pneumonia 11/24/2016  . Acute respiratory failure with hypoxemia ([place]) 11/19/2016  . Sepsis ([place]) 11/17/2016  . Skin nodule 09/05/2016  . Menopause 07/21/2016  . Clinical depression 01/12/2016  . Difficult or painful urination 03/16/2015  . Fatigue 03/16/2015  . Anxiety, generalized 03/16/2015  . Acid reflux 03/16/2015  . Cardiac murmur 03/16/2015  . H/O major abdominal surgery 03/16/2015  . Calculus of kidney 03/16/2015  . Acquired lymphedema 03/16/2015  . Affective disorder, major 03/16/2015  . Arthritis, degenerative 03/16/2015  . Adiposity 03/16/2015  . Raynaud's syndrome 03/16/2015  . Muscle rigidity 03/16/2015  . Fothergill's neuralgia 03/16/2015  . Avitaminosis D 03/16/2015  . DDD (degenerative disc disease), lumbar 11/20/2014  . Neuritis or radiculitis due to rupture of lumbar intervertebral disc 11/20/2014  . Lower abdominal pain 08/05/2014  . Diarrhea 08/05/2014    Past Medical History:  Diagnosis Date  . Anxiety   . Depression   . GERD (gastroesophageal reflux disease)   . History of kidney stones   . Lymphedema   . Neuralgia   . Osteoarthritis   . Pneumonia   . Raynaud disease     Social History   Socioeconomic History  . Marital status: Married    Spouse name: Not on file  . Number of children: Not on file  . Years of education: Not on file  . Highest education level: Not on file  Occupational History  . Not on file  Social Needs  .  Financial resource strain: Not on file  . Food insecurity:    Worry: Not on file    Inability: Not on file  . Transportation needs:    Medical: Not on file    Non-medical: Not on file  Tobacco Use  . Smoking status: Former Smoker    Years: 5.00  . Smokeless tobacco: Never Used  . Tobacco comment: Quit smoking in 1980's  Substance and Sexual Activity  . Alcohol use: Yes    Alcohol/week: 1.0 standard drinks    Types: 1 Glasses of wine per week    Comment: occasional  . Drug use: No  . Sexual activity: Not on file  Lifestyle  . Physical activity:    Days per week: Not on file    Minutes per session: Not on file  . Stress: Not on file  Relationships  . Social connections:    Talks on phone: Not on file    Gets together: Not on file    Attends religious service: Not on file    Active member of club or organization: Not on file    Attends meetings of clubs or organizations: Not on file    Relationship status: Not on file  . Intimate partner violence:    Fear of current or ex partner: Not on file    Emotionally abused: Not on file    Physically abused: Not on file  Forced sexual activity: Not on file  Other Topics Concern  . Not on file  Social History Narrative   Lives at home with husband and family. Independent at baseline    Outpatient Encounter Medications as of 08/21/2018  Medication Sig Note  . celecoxib (CELEBREX) 200 MG capsule TAKE 1 CAPSULE BY MOUTH  DAILY   . Cholecalciferol (VITAMIN D3) 2000 UNITS TABS Take 2,000 Units by mouth as needed.    . diazepam (VALIUM) 5 MG tablet Take 5 mg by mouth every 12 (twelve) hours as needed.    . DiphenhydrAMINE HCl, Sleep, 50 MG CAPS Take 50 mg by mouth at bedtime as needed.    Marland Kitchen estradiol (ESTRACE) 1 MG tablet TAKE 1 TABLET BY MOUTH  DAILY   . fluticasone (FLONASE) 50 MCG/ACT nasal spray Place 1 spray into the nose daily as needed.    . hydrocortisone 1 % lotion Apply 1 application topically 2 (two) times daily.   Marland Kitchen  omeprazole (PRILOSEC) 20 MG capsule TAKE 1 CAPSULE BY MOUTH TWO TIMES DAILY WITH FOOD   . sertraline (ZOLOFT) 100 MG tablet TAKE 2 TABLETS BY MOUTH  DAILY   . predniSONE (STERAPRED UNI-PAK 21 TAB) 10 MG (21) TBPK tablet Taper as directed. (Patient not taking: Reported on 08/21/2018) 08/21/2018: Patient filled the Rx but has not taken it.   . [DISCONTINUED] alfuzosin (UROXATRAL) 10 MG 24 hr tablet Take 1 tablet (10 mg total) by mouth daily with breakfast.   . [DISCONTINUED] celecoxib (CELEBREX) 100 MG capsule Take 100 mg by mouth daily.   . [DISCONTINUED] oxyCODONE-acetaminophen (PERCOCET) 5-325 MG tablet Take 1 tablet by mouth every 4 (four) hours as needed for moderate pain or severe pain.   . [DISCONTINUED] tamsulosin (FLOMAX) 0.4 MG CAPS capsule Take 1 capsule (0.4 mg total) by mouth daily.    Facility-Administered Encounter Medications as of 08/21/2018  Medication  . methylPREDNISolone acetate (DEPO-MEDROL) injection 80 mg    Allergies  Allergen Reactions  . Benzoin     Blisters.  . Biaxin [Clarithromycin] Diarrhea    GI upset  . Sulfa Antibiotics Itching  . Tegretol  [Carbamazepine] Itching  . Betadine  [Povidone Iodine] Rash   Depression screen [facility] 2/9 08/21/2018 10/16/2017 05/19/2017 07/21/2016 07/13/2015  Decreased Interest 0 0 3 2 1   Down, Depressed, Hopeless 0 0 2 1 0  PHQ - 2 Score 0 0 5 3 1   Altered sleeping 0 0 1 0 -  Tired, decreased energy 1 1 3 2  -  Change in appetite 0 0 1 0 -  Feeling bad or failure about yourself  0 0 3 1 -  Trouble concentrating 0 0 3 2 -  Moving slowly or fidgety/restless 0 0 1 1 -  Suicidal thoughts 0 0 0 0 -  PHQ-9 Score 1 1 17 9  -  Difficult doing work/chores Not difficult at all Not difficult at all Extremely dIfficult Very difficult -     Review of Systems  Constitutional: Negative for fever and malaise/fatigue.  HENT: Negative.   Eyes: Negative.   Respiratory: Negative.   Cardiovascular: Negative for chest pain, palpitations,  orthopnea, claudication and leg swelling.  Gastrointestinal: Negative.   Skin: Negative.   Endo/Heme/Allergies: Negative.   Psychiatric/Behavioral: Positive for depression (90% better per the patient). Negative for memory loss. The patient is nervous/anxious (little anxiety). The patient does not have insomnia.     Objective:  BP 130/86 (BP Location: Right Arm, Patient Position: Sitting, Cuff Size: Normal)   Pulse 83  Temp 98 F (36.7 C) (Oral)   Resp 16   Wt 275 lb (124.7 kg)   BMI 36.28 kg/m   Physical Exam  Constitutional: She is oriented to person, place, and time and well-developed, well-nourished, and in no distress.  HENT:  Head: Normocephalic and atraumatic.  Right Ear: External ear normal.  Left Ear: External ear normal.  Nose: Nose normal.  Eyes: Conjunctivae are normal.  Neck: No thyromegaly present.  Cardiovascular: Normal rate, regular rhythm and normal heart sounds.  Pulmonary/Chest: Effort normal and breath sounds normal.  Abdominal: Soft.  Musculoskeletal: She exhibits no edema.  Neurological: She is alert and oriented to person, place, and time. Gait normal. GCS score is 15.  Skin: Skin is warm and dry.  Psychiatric: Mood, memory, affect and judgment normal.    Assessment and Plan :   1. Other chronic pain  - amitriptyline (ELAVIL) 25 MG tablet; Take 1 tablet (25 mg total) by mouth at bedtime.  Dispense: 30 tablet; Refill: 12  2. Osteoarthritis, unspecified osteoarthritis type, unspecified site   3. Major depressive disorder with single episode, in full remission ([place])   4. Class 2 obesity due to excess calories without serious comorbidity with body mass index (BMI) of 37.0 to 37.9 in adult Just lifestyle in that weight loss will help her chronic arthritic pain also.   HPI, Exam and A&P Transcribed under the direction and in the presence of Miguel Aschoff, Brooke Bonito., MD. Electronically Signed: Althea Charon, RMA I have done the exam and reviewed the  chart and it is accurate to the best of my knowledge. Development worker, community has been used and  any errors in dictation or transcription are unintentional. Miguel Aschoff M.D. [facility]

## 2018-08-28 ENCOUNTER — Telehealth: Payer: Self-pay | Admitting: *Deleted

## 2018-08-28 NOTE — Telephone Encounter (Signed)
Ok thx.

## 2018-08-28 NOTE — Telephone Encounter (Signed)
Patient stated after she took the amitriptyline 25 mg, that was prescribed 08/21/2018. Her face broke out in blistery bumps that were itchy. Patient stated she took amitriptyline for 4 days and stopped taking it on the 5th day. Patient stated she just wanted to let Dr. Rosanna Randy know she had a reaction to the medication.

## 2018-09-17 ENCOUNTER — Encounter: Payer: Self-pay | Admitting: Family Medicine

## 2018-09-18 ENCOUNTER — Other Ambulatory Visit: Payer: Self-pay

## 2018-09-18 MED ORDER — BUSPIRONE HCL 10 MG PO TABS: 10.0000 mg | ORAL_TABLET | Freq: Two times a day (BID) | ORAL | 3 refills | Status: DC

## 2018-11-07 ENCOUNTER — Encounter: Payer: Self-pay | Admitting: Family Medicine

## 2018-11-07 ENCOUNTER — Ambulatory Visit
Admission: RE | Admit: 2018-11-07 | Discharge: 2018-11-07 | Disposition: A | Discharge status: Home / Self Care | Payer: 59 | Attending: Family Medicine | Admitting: Family Medicine

## 2018-11-07 ENCOUNTER — Ambulatory Visit: Payer: 59 | Admitting: Family Medicine

## 2018-11-07 ENCOUNTER — Ambulatory Visit
Admission: RE | Admit: 2018-11-07 | Discharge: 2018-11-07 | Disposition: A | Discharge status: Home / Self Care | Payer: 59 | Source: Ambulatory Visit | Attending: Family Medicine | Admitting: Family Medicine

## 2018-11-07 VITALS — BP 116/82 | HR 96 | Temp 98.4°F | Wt 276.8 lb

## 2018-11-07 DIAGNOSIS — M1990 Unspecified osteoarthritis, unspecified site: Secondary | ICD-10-CM

## 2018-11-07 DIAGNOSIS — M545 Low back pain, unspecified: Secondary | ICD-10-CM

## 2018-11-07 DIAGNOSIS — M5136 Other intervertebral disc degeneration, lumbar region: Secondary | ICD-10-CM

## 2018-11-07 MED ORDER — PREGABALIN 50 MG PO CAPS: 50.0000 mg | ORAL_CAPSULE | Freq: Two times a day (BID) | ORAL | 11 refills | Status: DC

## 2018-11-07 MED ORDER — KETOROLAC TROMETHAMINE 60 MG/2ML IM SOLN
60.0000 mg | Freq: Once | INTRAMUSCULAR | Status: AC
  Administered 2018-11-07: 60 mg via INTRAMUSCULAR

## 2018-11-07 NOTE — Patient Instructions (Signed)
Hold Celebrex. Take Prednisone taper dose you have at home. Lyrica RX sent to your pharmacy. Return to clinic in 2-3 weeks.

## 2018-11-07 NOTE — Progress Notes (Signed)
Patient: Lisa Stein Female    DOB: 1961/08/07   58 y.o.   MRN: [id number] Visit Date: 11/07/2018  Today's Provider: [name], MD   Chief Complaint  Patient presents with  . Back Pain   Subjective:     Back Pain  This is a recurrent problem. The current episode started in the past 7 days. Episode frequency: almost constantly. The problem has been gradually worsening since onset. The pain is present in the lumbar spine. The quality of the pain is described as burning. The symptoms are aggravated by standing, position and lying down (walking). Stiffness is present all day. Risk factors: previous back surgery. Treatments tried: Celebrex and Tylenol.    Allergies  Allergen Reactions  . Amitriptyline Swelling  . Benzoin     Blisters.  . Biaxin [Clarithromycin] Diarrhea    GI upset  . Sulfa Antibiotics Itching  . Tegretol  [Carbamazepine] Itching  . Betadine  [Povidone Iodine] Rash     Current Outpatient Medications:  .  busPIRone (BUSPAR) 10 MG tablet, Take 1 tablet (10 mg total) by mouth 2 (two) times daily., Disp: 180 tablet, Rfl: 3 .  celecoxib (CELEBREX) 200 MG capsule, TAKE 1 CAPSULE BY MOUTH  DAILY, Disp: 90 capsule, Rfl: 3 .  Cholecalciferol (VITAMIN D3) 2000 UNITS TABS, Take 2,000 Units by mouth as needed. , Disp: , Rfl:  .  DiphenhydrAMINE HCl, Sleep, 50 MG CAPS, Take 50 mg by mouth at bedtime as needed. , Disp: , Rfl:  .  estradiol (ESTRACE) 1 MG tablet, TAKE 1 TABLET BY MOUTH  DAILY, Disp: 90 tablet, Rfl: 3 .  fluticasone (FLONASE) 50 MCG/ACT nasal spray, Place 1 spray into the nose daily as needed. , Disp: , Rfl:  .  hydrocortisone 1 % lotion, Apply 1 application topically 2 (two) times daily., Disp: 118 mL, Rfl: 0 .  omeprazole (PRILOSEC) 20 MG capsule, TAKE 1 CAPSULE BY MOUTH TWO TIMES DAILY WITH FOOD, Disp: 180 capsule, Rfl: 3 .  sertraline (ZOLOFT) 100 MG tablet, TAKE 2 TABLETS BY MOUTH  DAILY, Disp: 180 tablet, Rfl: 3  Current  Facility-Administered Medications:  .  methylPREDNISolone acetate (DEPO-MEDROL) injection 80 mg, 80 mg, Intramuscular, Once, Jerrol Banana., MD  Review of Systems  Constitutional: Negative.   HENT: Negative.   Eyes: Negative.   Respiratory: Negative.   Endocrine: Negative.   Genitourinary: Negative.   Musculoskeletal: Positive for back pain.  Allergic/Immunologic: Negative.   Neurological: Negative.   Hematological: Negative.   Psychiatric/Behavioral: Negative.     Social History   Tobacco Use  . Smoking status: Former Smoker    Years: 5.00  . Smokeless tobacco: Never Used  . Tobacco comment: Quit smoking in 1980's  Substance Use Topics  . Alcohol use: Yes    Alcohol/week: 1.0 standard drinks    Types: 1 Glasses of wine per week    Comment: occasional      Objective:   BP 116/82 (BP Location: Right Arm, Patient Position: Sitting, Cuff Size: Large)   Pulse 96   Temp 98.4 F (36.9 C) (Oral)   Wt 276 lb 12.8 oz (125.6 kg)   SpO2 95%   BMI 36.52 kg/m  Vitals:   11/07/18 1459  BP: 116/82  Pulse: 96  Temp: 98.4 F (36.9 C)  TempSrc: Oral  SpO2: 95%  Weight: 276 lb 12.8 oz (125.6 kg)     Physical Exam Vitals signs reviewed.  Constitutional:  Appearance: She is well-developed.  HENT:     Head: Normocephalic and atraumatic.  Eyes:     General: No scleral icterus.    Conjunctiva/sclera: Conjunctivae normal.  Neck:     Thyroid: No thyromegaly.  Cardiovascular:     Rate and Rhythm: Normal rate and regular rhythm.     Heart sounds: Normal heart sounds.  Pulmonary:     Effort: Pulmonary effort is normal.     Breath sounds: Normal breath sounds.  Abdominal:     Palpations: Abdomen is soft.  Musculoskeletal:        General: Tenderness present.     Comments: She has tender numbness over the great trochanter bursa of both hips, left greater than right.  She also has mild tenderness over the LS spine area in both SI joints.  Skin:    General: Skin  is warm and dry.  Neurological:     Mental Status: She is alert and oriented to person, place, and time.  Psychiatric:        Mood and Affect: Mood normal.        Behavior: Behavior normal.        Thought Content: Thought content normal.        Judgment: Judgment normal.         Assessment & Plan    1. Lumbar back pain Toradol followed by prednisone taper.  Return to clinic 2 to 3 weeks. - DG Lumbar Spine 2-3 Views; Future - ketorolac (TORADOL) injection 60 mg  2. DDD (degenerative disc disease), lumbar May need further imaging.  3. Osteoarthritis, unspecified osteoarthritis type, unspecified site     I have done the exam and reviewed the above chart and it is accurate to the best of my knowledge. Development worker, community has been used in this note in any air is in the dictation or transcription are unintentional.  [name], MD  [place]

## 2018-11-13 ENCOUNTER — Encounter: Payer: Self-pay | Admitting: Family Medicine

## 2018-11-27 ENCOUNTER — Ambulatory Visit (INDEPENDENT_AMBULATORY_CARE_PROVIDER_SITE_OTHER): Payer: 59 | Admitting: Family Medicine

## 2018-11-27 ENCOUNTER — Encounter: Payer: Self-pay | Admitting: Family Medicine

## 2018-11-27 VITALS — BP 126/74 | HR 74 | Temp 98.5°F | Resp 16 | Ht 73.0 in | Wt 286.0 lb

## 2018-11-27 DIAGNOSIS — M5136 Other intervertebral disc degeneration, lumbar region: Secondary | ICD-10-CM | POA: Diagnosis not present

## 2018-11-27 DIAGNOSIS — Z6837 Body mass index (BMI) 37.0-37.9, adult: Secondary | ICD-10-CM

## 2018-11-27 DIAGNOSIS — M51369 Other intervertebral disc degeneration, lumbar region without mention of lumbar back pain or lower extremity pain: Secondary | ICD-10-CM

## 2018-11-27 DIAGNOSIS — M1710 Unilateral primary osteoarthritis, unspecified knee: Secondary | ICD-10-CM

## 2018-11-27 NOTE — Progress Notes (Signed)
Patient: Lisa Stein Female    DOB: 12-13-1960   58 y.o.   MRN: [id number] Visit Date: 11/27/2018  Today's Provider: [name], MD   Chief Complaint  Patient presents with  . Follow-up  . Back Pain   Subjective:     HPI  Patient comes in today for a follow up. She was last seen in the office 2 weeks ago. She reports that she was given a toradol injection in the office which helped, and prescribed prednisone taper. She reports that she could not take Lyrica.  She has follow-up with [facility] for the pain and will do this next month.  It has settled down some. Patient is upset today as her son has been diagnosed with stage IV kidney disease.  Has appointment with nephrology next week. Allergies  Allergen Reactions  . Amitriptyline Swelling  . Benzoin     Blisters.  . Biaxin [Clarithromycin] Diarrhea    GI upset  . Sulfa Antibiotics Itching  . Tegretol  [Carbamazepine] Itching  . Betadine  [Povidone Iodine] Rash     Current Outpatient Medications:  .  busPIRone (BUSPAR) 10 MG tablet, Take 1 tablet (10 mg total) by mouth 2 (two) times daily., Disp: 180 tablet, Rfl: 3 .  celecoxib (CELEBREX) 200 MG capsule, TAKE 1 CAPSULE BY MOUTH  DAILY, Disp: 90 capsule, Rfl: 3 .  Cholecalciferol (VITAMIN D3) 2000 UNITS TABS, Take 2,000 Units by mouth as needed. , Disp: , Rfl:  .  DiphenhydrAMINE HCl, Sleep, 50 MG CAPS, Take 50 mg by mouth at bedtime as needed. , Disp: , Rfl:  .  estradiol (ESTRACE) 1 MG tablet, TAKE 1 TABLET BY MOUTH  DAILY, Disp: 90 tablet, Rfl: 3 .  fluticasone (FLONASE) 50 MCG/ACT nasal spray, Place 1 spray into the nose daily as needed. , Disp: , Rfl:  .  hydrocortisone 1 % lotion, Apply 1 application topically 2 (two) times daily., Disp: 118 mL, Rfl: 0 .  omeprazole (PRILOSEC) 20 MG capsule, TAKE 1 CAPSULE BY MOUTH TWO TIMES DAILY WITH FOOD, Disp: 180 capsule, Rfl: 3 .  sertraline (ZOLOFT) 100 MG tablet, TAKE 2 TABLETS BY MOUTH  DAILY, Disp: 180 tablet, Rfl:  3 .  pregabalin (LYRICA) 50 MG capsule, Take 1 capsule (50 mg total) by mouth 2 (two) times daily. (Patient not taking: Reported on 11/27/2018), Disp: 60 capsule, Rfl: 11  Current Facility-Administered Medications:  .  methylPREDNISolone acetate (DEPO-MEDROL) injection 80 mg, 80 mg, Intramuscular, Once, Jerrol Banana., MD  Review of Systems  Constitutional: Negative for activity change and fatigue.  HENT: Negative.   Eyes: Negative.   Respiratory: Negative.   Cardiovascular: Negative for chest pain, palpitations and leg swelling.  Gastrointestinal: Negative.   Musculoskeletal: Positive for arthralgias and myalgias.  Skin: Negative.   Allergic/Immunologic: Negative.   Neurological: Negative for weakness and numbness.  Psychiatric/Behavioral: Negative.     Social History   Tobacco Use  . Smoking status: Former Smoker    Years: 5.00  . Smokeless tobacco: Never Used  . Tobacco comment: Quit smoking in 1980's  Substance Use Topics  . Alcohol use: Yes    Alcohol/week: 1.0 standard drinks    Types: 1 Glasses of wine per week    Comment: occasional      Objective:   BP 126/74 (BP Location: Left Arm, Patient Position: Sitting, Cuff Size: Large)   Pulse 74   Temp 98.5 F (36.9 C)   Resp 16  Ht 6\' 1"  (1.854 m)   Wt 286 lb (129.7 kg)   SpO2 97%   BMI 37.73 kg/m  Vitals:   11/27/18 1618  BP: 126/74  Pulse: 74  Resp: 16  Temp: 98.5 F (36.9 C)  SpO2: 97%  Weight: 286 lb (129.7 kg)  Height: 6\' 1"  (1.854 m)     Physical Exam Vitals signs reviewed.  Constitutional:      Appearance: She is well-developed.  HENT:     Head: Normocephalic and atraumatic.  Eyes:     General: No scleral icterus.    Conjunctiva/sclera: Conjunctivae normal.  Neck:     Thyroid: No thyromegaly.  Cardiovascular:     Rate and Rhythm: Normal rate and regular rhythm.     Heart sounds: Normal heart sounds.  Pulmonary:     Effort: Pulmonary effort is normal.     Breath sounds: Normal  breath sounds.  Abdominal:     Palpations: Abdomen is soft.  Musculoskeletal:        General: Tenderness present.     Comments: She has tender numbness over the great trochanter bursa of both hips, left greater than right.  She also has mild tenderness over the LS spine area in both SI joints.  Skin:    General: Skin is warm and dry.  Neurological:     General: No focal deficit present.     Mental Status: She is alert and oriented to person, place, and time. Mental status is at baseline.  Psychiatric:        Mood and Affect: Mood normal.        Behavior: Behavior normal.        Thought Content: Thought content normal.        Judgment: Judgment normal.         Assessment & Plan  1. DDD (degenerative disc disease), lumbar Follow-up at [facility].  2. Primary osteoarthritis of knee, unspecified laterality   3. Class 2 severe obesity due to excess calories with serious comorbidity and body mass index (BMI) of 37.0 to 37.9 in adult [facility]) Knows to work on habits. 4.  S/P reaction/anxiety Refill Xanax which she has not had in 3 years.    I have done the exam and reviewed the above chart and it is accurate to the best of my knowledge. Development worker, community has been used in this note in any air is in the dictation or transcription are unintentional.  [name], MD  [place]

## 2018-11-28 ENCOUNTER — Encounter: Payer: Self-pay | Admitting: Family Medicine

## 2018-11-28 MED ORDER — ALPRAZOLAM 0.5 MG PO TABS: 0.5000 mg | ORAL_TABLET | Freq: Two times a day (BID) | ORAL | 3 refills | Status: DC | PRN

## 2019-01-21 ENCOUNTER — Encounter: Payer: Self-pay | Admitting: Family Medicine

## 2019-02-06 ENCOUNTER — Other Ambulatory Visit: Payer: Self-pay | Admitting: Family Medicine

## 2019-02-06 DIAGNOSIS — N951 Menopausal and female climacteric states: Secondary | ICD-10-CM

## 2019-02-10 ENCOUNTER — Other Ambulatory Visit: Payer: Self-pay | Admitting: Family Medicine

## 2019-02-10 DIAGNOSIS — F411 Generalized anxiety disorder: Secondary | ICD-10-CM

## 2019-02-19 ENCOUNTER — Ambulatory Visit: Payer: Self-pay | Admitting: Family Medicine

## 2019-03-06 ENCOUNTER — Other Ambulatory Visit: Payer: Self-pay

## 2019-03-06 ENCOUNTER — Ambulatory Visit: Payer: 59 | Admitting: Family Medicine

## 2019-03-06 ENCOUNTER — Encounter: Payer: Self-pay | Admitting: Family Medicine

## 2019-03-06 VITALS — BP 128/80 | HR 90 | Temp 98.6°F | Resp 16 | Wt 280.2 lb

## 2019-03-06 DIAGNOSIS — F329 Major depressive disorder, single episode, unspecified: Secondary | ICD-10-CM

## 2019-03-06 DIAGNOSIS — E785 Hyperlipidemia, unspecified: Secondary | ICD-10-CM

## 2019-03-06 DIAGNOSIS — R5383 Other fatigue: Secondary | ICD-10-CM | POA: Diagnosis not present

## 2019-03-06 DIAGNOSIS — E559 Vitamin D deficiency, unspecified: Secondary | ICD-10-CM

## 2019-03-06 DIAGNOSIS — F32A Depression, unspecified: Secondary | ICD-10-CM

## 2019-03-06 DIAGNOSIS — M5136 Other intervertebral disc degeneration, lumbar region: Secondary | ICD-10-CM

## 2019-03-06 DIAGNOSIS — R739 Hyperglycemia, unspecified: Secondary | ICD-10-CM

## 2019-03-06 NOTE — Progress Notes (Signed)
Patient: Lisa Stein Female    DOB: [birth year]   58 y.o.   MRN: [id number] Visit Date: 03/06/2019  Today's Provider: [name], MD   Chief Complaint  Patient presents with  . Follow-up   Subjective:     HPI   Follow up for DDD  The patient was last seen for this 3 months ago. Changes made at last visit include patient was to follow up with specialist at [facility] but patient states that she was unable to make appointment due to Covid restrictions. Patient states that pain in lumbar region has improved more now since she is working from home. Patient states that she has not needed to take otc acetaminophen or NSAID for pain relief and states that pain is more tolerable.  She has been furloughed by [employer] after 34 years but she is doing well.     Allergies  Allergen Reactions  . Amitriptyline Swelling  . Benzoin     Blisters.  . Biaxin [Clarithromycin] Diarrhea    GI upset  . Sulfa Antibiotics Itching  . Tegretol  [Carbamazepine] Itching  . Betadine  [Povidone Iodine] Rash     Current Outpatient Medications:  .  ALPRAZolam (XANAX) 0.5 MG tablet, Take 1 tablet (0.5 mg total) by mouth 2 (two) times daily as needed for anxiety., Disp: 60 tablet, Rfl: 3 .  busPIRone (BUSPAR) 10 MG tablet, Take 1 tablet (10 mg total) by mouth 2 (two) times daily., Disp: 180 tablet, Rfl: 3 .  celecoxib (CELEBREX) 200 MG capsule, TAKE 1 CAPSULE BY MOUTH  DAILY, Disp: 90 capsule, Rfl: 3 .  Cholecalciferol (VITAMIN D3) 2000 UNITS TABS, Take 2,000 Units by mouth as needed. , Disp: , Rfl:  .  DiphenhydrAMINE HCl, Sleep, 50 MG CAPS, Take 50 mg by mouth at bedtime as needed. , Disp: , Rfl:  .  estradiol (ESTRACE) 1 MG tablet, TAKE 1 TABLET BY MOUTH  DAILY, Disp: 90 tablet, Rfl: 3 .  fluticasone (FLONASE) 50 MCG/ACT nasal spray, Place 1 spray into the nose daily as needed. , Disp: , Rfl:  .  hydrocortisone 1 % lotion, Apply 1 application topically 2 (two) times daily., Disp: 118 mL, Rfl:  0 .  omeprazole (PRILOSEC) 20 MG capsule, TAKE 1 CAPSULE BY MOUTH TWO TIMES DAILY WITH FOOD, Disp: 180 capsule, Rfl: 3 .  pregabalin (LYRICA) 50 MG capsule, Take 1 capsule (50 mg total) by mouth 2 (two) times daily. (Patient not taking: Reported on 11/27/2018), Disp: 60 capsule, Rfl: 11 .  sertraline (ZOLOFT) 100 MG tablet, TAKE 2 TABLETS BY MOUTH  DAILY, Disp: 180 tablet, Rfl: 3  Current Facility-Administered Medications:  .  methylPREDNISolone acetate (DEPO-MEDROL) injection 80 mg, 80 mg, Intramuscular, Once, Jerrol Banana., MD  Review of Systems  Constitutional: Negative.   HENT: Negative.   Respiratory: Negative.   Cardiovascular: Negative.   Gastrointestinal: Negative.   Endocrine: Negative.   Musculoskeletal: Positive for back pain, joint swelling and myalgias. Negative for gait problem, neck pain and neck stiffness.  Allergic/Immunologic: Negative.   Neurological: Negative.   Hematological: Negative.   Psychiatric/Behavioral: Negative.     Social History   Tobacco Use  . Smoking status: Former Smoker    Years: 5.00  . Smokeless tobacco: Never Used  . Tobacco comment: Quit smoking in 1980's  Substance Use Topics  . Alcohol use: Yes    Alcohol/week: 1.0 standard drinks    Types: 1 Glasses of wine per week  Comment: occasional      Objective:   BP 128/80   Pulse 90   Temp 98.6 F (37 C) (Oral)   Resp 16   Wt 280 lb 3.2 oz (127.1 kg)   BMI 36.97 kg/m  Vitals:   03/06/19 1432  BP: 128/80  Pulse: 90  Resp: 16  Temp: 98.6 F (37 C)  TempSrc: Oral  Weight: 280 lb 3.2 oz (127.1 kg)     Physical Exam Vitals signs reviewed.  Constitutional:      Appearance: Normal appearance.  HENT:     Head: Normocephalic and atraumatic.     Right Ear: External ear normal.     Left Ear: External ear normal.     Nose: Nose normal.  Eyes:     Conjunctiva/sclera: Conjunctivae normal.  Cardiovascular:     Rate and Rhythm: Normal rate and regular rhythm.      Pulses: Normal pulses.     Heart sounds: Normal heart sounds.  Pulmonary:     Breath sounds: Normal breath sounds.  Abdominal:     Palpations: Abdomen is soft.  Skin:    General: Skin is warm and dry.  Neurological:     Mental Status: She is alert and oriented to person, place, and time. Mental status is at baseline.  Psychiatric:        Mood and Affect: Mood normal.        Behavior: Behavior normal.        Thought Content: Thought content normal.        Judgment: Judgment normal.         Assessment & Plan    1. Depression, in remission Good on sertraline,buspar.  2. Fatigue, unspecified type  - CBC with Differential/Platelet - TSH  3. DDD (degenerative disc disease), lumbar   4. Hyperlipidemia, unspecified hyperlipidemia type  - Comprehensive metabolic panel - Lipid panel  5. Avitaminosis D  - VITAMIN D 25 Hydroxy (Vit-D Deficiency, Fractures)  6. Hyperglycemia  - Hemoglobin A1c     [name], MD  [facility] Fritzi Mandes [place] as a scribe for [name], MD.,have documented all relevant documentation on the behalf of [name], MD,as directed by  [name], MD while in the presence of [name], MD.

## 2019-03-19 LAB — LIPID PANEL

## 2019-03-20 LAB — CBC WITH DIFFERENTIAL/PLATELET
Basophils Absolute: 0 10*3/uL (ref 0.0–0.2)
Basos: 0 %
EOS (ABSOLUTE): 0.1 10*3/uL (ref 0.0–0.4)
Eos: 2 %
Hematocrit: 45.4 % (ref 34.0–46.6)
Hemoglobin: 14.4 g/dL (ref 11.1–15.9)
Immature Grans (Abs): 0 10*3/uL (ref 0.0–0.1)
Immature Granulocytes: 0 %
Lymphocytes Absolute: 1.9 10*3/uL (ref 0.7–3.1)
Lymphs: 38 %
MCH: 26.8 pg (ref 26.6–33.0)
MCHC: 31.7 g/dL (ref 31.5–35.7)
MCV: 84 fL (ref 79–97)
Monocytes Absolute: 0.4 10*3/uL (ref 0.1–0.9)
Monocytes: 9 %
Neutrophils Absolute: 2.5 10*3/uL (ref 1.4–7.0)
Neutrophils: 51 %
Platelets: 285 10*3/uL (ref 150–450)
RBC: 5.38 x10E6/uL — ABNORMAL HIGH (ref 3.77–5.28)
RDW: 13.1 % (ref 11.7–15.4)
WBC: 5 10*3/uL (ref 3.4–10.8)

## 2019-03-20 LAB — COMPREHENSIVE METABOLIC PANEL
ALT: 19 IU/L (ref 0–32)
AST: 24 IU/L (ref 0–40)
Albumin/Globulin Ratio: 2.4 — ABNORMAL HIGH (ref 1.2–2.2)
Albumin: 4.7 g/dL (ref 3.8–4.9)
Alkaline Phosphatase: 68 IU/L (ref 39–117)
BUN/Creatinine Ratio: 19 (ref 12–28)
BUN: 18 mg/dL (ref 8–27)
Bilirubin Total: 0.3 mg/dL (ref 0.0–1.2)
CO2: 23 mmol/L (ref 20–29)
Calcium: 9.9 mg/dL (ref 8.7–10.3)
Chloride: 101 mmol/L (ref 96–106)
Creatinine, Ser: 0.96 mg/dL (ref 0.57–1.00)
GFR calc Af Amer: 74 mL/min/{1.73_m2} (ref >59)
GFR calc non Af Amer: 64 mL/min/{1.73_m2} (ref >59)
Globulin, Total: 2 g/dL (ref 1.5–4.5)
Glucose: 89 mg/dL (ref 65–99)
Potassium: 4.9 mmol/L (ref 3.5–5.2)
Sodium: 141 mmol/L (ref 134–144)
Total Protein: 6.7 g/dL (ref 6.0–8.5)

## 2019-03-20 LAB — HEMOGLOBIN A1C
Est. average glucose Bld gHb Est-mCnc: 108 mg/dL
Hgb A1c MFr Bld: 5.4 % (ref 4.8–5.6)

## 2019-03-20 LAB — LIPID PANEL
Chol/HDL Ratio: 2.9 ratio (ref 0.0–4.4)
Cholesterol, Total: 297 mg/dL — ABNORMAL HIGH (ref 100–199)
HDL: 104 mg/dL (ref >39)
LDL Calculated: 163 mg/dL — ABNORMAL HIGH (ref 0–99)
Triglycerides: 151 mg/dL — ABNORMAL HIGH (ref 0–149)
VLDL Cholesterol Cal: 30 mg/dL (ref 5–40)

## 2019-03-20 LAB — VITAMIN D 25 HYDROXY (VIT D DEFICIENCY, FRACTURES): Vit D, 25-Hydroxy: 29.8 ng/mL — ABNORMAL LOW (ref 30.0–100.0)

## 2019-03-20 LAB — TSH: TSH: 2.08 u[IU]/mL (ref 0.450–4.500)

## 2019-03-25 ENCOUNTER — Telehealth: Payer: Self-pay

## 2019-03-25 NOTE — Telephone Encounter (Signed)
-----   Message from Jerrol Banana., MD sent at 03/21/2019 11:57 AM EDT ----- Labs stable but cholesterol up--work on lifestyle for this.

## 2019-03-25 NOTE — Telephone Encounter (Signed)
Patient was advised.  

## 2019-05-12 ENCOUNTER — Other Ambulatory Visit: Payer: Self-pay | Admitting: Family Medicine

## 2019-05-27 ENCOUNTER — Other Ambulatory Visit: Payer: Self-pay | Admitting: Family Medicine

## 2019-05-27 NOTE — Telephone Encounter (Signed)
[facility] faxed refill request for the following medications:  ALPRAZolam (XANAX) 0.5 MG tablet   Please advise.

## 2019-05-28 MED ORDER — ALPRAZOLAM 0.5 MG PO TABS: 0.5000 mg | ORAL_TABLET | Freq: Two times a day (BID) | ORAL | 3 refills | Status: AC | PRN

## 2019-05-28 NOTE — Telephone Encounter (Signed)
Please review. Thanks!  

## 2019-06-06 ENCOUNTER — Telehealth: Payer: 59 | Admitting: Physician Assistant

## 2019-06-06 DIAGNOSIS — Z20822 Contact with and (suspected) exposure to covid-19: Secondary | ICD-10-CM

## 2019-06-06 MED ORDER — BENZONATATE 100 MG PO CAPS: 100.0000 mg | ORAL_CAPSULE | Freq: Three times a day (TID) | ORAL | 0 refills | Status: DC | PRN

## 2019-06-06 NOTE — Progress Notes (Signed)
I have spent 5 minutes in review of e-visit questionnaire, review and updating patient chart, medical decision making and response to patient.   Zafirah Vanzee Cody Reeda Soohoo, PA-C    

## 2019-06-06 NOTE — Progress Notes (Signed)
E-Visit for Corona Virus Screening   Your current symptoms could be consistent with the coronavirus.  Many health care providers can now test patients at their office but not all are.  [facility] has multiple testing sites. For information on our COVID testing locations and hours go to [email or web address]  Please quarantine yourself while awaiting your test results.  We are enrolling you in our [facility] for [place] . Daily you will receive a questionnaire within the [facility] website. Our COVID 19 response team willl be monitoriing your responses daily.    COVID-19 is a respiratory illness with symptoms that are similar to the flu. Symptoms are typically mild to moderate, but there have been cases of severe illness and death due to the virus. The following symptoms may appear 2-14 days after exposure: . Fever . Cough . Shortness of breath or difficulty breathing . Chills . Repeated shaking with chills . Muscle pain . Headache . Sore throat . New loss of taste or smell . Fatigue . Congestion or runny nose . Nausea or vomiting . Diarrhea  It is vitally important that if you feel that you have an infection such as this virus or any other virus that you stay home and away from places where you may spread it to others.  You should self-quarantine for 14 days if you have symptoms that could potentially be coronavirus or have been in close contact a with a person diagnosed with COVID-19 within the last 2 weeks. You should avoid contact with people age 28 and older.   You should wear a mask or cloth face covering over your nose and mouth if you must be around other people or animals, including pets (even at home). Try to stay at least 6 feet away from other people. This will protect the people around you.  You can use medication such as A prescription cough medication called Tessalon Perles 100 mg. You may take 1-2 capsules every 8 hours as needed for  cough. I have sent in the prescription (have someone pick this up for you or go through the drive-thru)  You may also take acetaminophen (Tylenol) as needed for fever.   Reduce your risk of any infection by using the same precautions used for avoiding the common cold or flu:  Marland Kitchen Wash your hands often with soap and warm water for at least 20 seconds.  If soap and water are not readily available, use an alcohol-based hand sanitizer with at least 60% alcohol.  . If coughing or sneezing, cover your mouth and nose by coughing or sneezing into the elbow areas of your shirt or coat, into a tissue or into your sleeve (not your hands). . Avoid shaking hands with others and consider head nods or verbal greetings only. . Avoid touching your eyes, nose, or mouth with unwashed hands.  . Avoid close contact with people who are sick. . Avoid places or events with large numbers of people in one location, like concerts or sporting events. . Carefully consider travel plans you have or are making. . If you are planning any travel outside or inside the [place], visit the CDC's Travelers' Health webpage for the latest health notices. . If you have some symptoms but not all symptoms, continue to monitor at home and seek medical attention if your symptoms worsen. . If you are having a medical emergency, call 911.  HOME CARE . Only take medications as instructed by your medical team. . Drink plenty of fluids and  get plenty of rest. . A steam or ultrasonic humidifier can help if you have congestion.   GET HELP RIGHT AWAY IF YOU HAVE EMERGENCY WARNING SIGNS** FOR COVID-19. If you or someone is showing any of these signs seek emergency medical care immediately. Call 911 or proceed to your closest emergency facility if: . You develop worsening high fever. . Trouble breathing . Bluish lips or face . Persistent pain or pressure in the chest . New confusion . Inability to wake or stay awake . You cough up blood. . Your  symptoms become more severe  **This list is not all possible symptoms. Contact your medical provider for any symptoms that are sever or concerning to you.   MAKE SURE YOU   Understand these instructions.  Will watch your condition.  Will get help right away if you are not doing well or get worse.  Your e-visit answers were reviewed by a board certified advanced clinical practitioner to complete your personal care plan.  Depending on the condition, your plan could have included both over the counter or prescription medications.  If there is a problem please reply once you have received a response from your provider.  Your safety is important to [place].  If you have drug allergies check your prescription carefully.    You can use MyChart to ask questions about today's visit, request a non-urgent call back, or ask for a work or school excuse for 24 hours related to this e-Visit. If it has been greater than 24 hours you will need to follow up with your provider, or enter a new e-Visit to address those concerns. You will get an e-mail in the next two days asking about your experience.  I hope that your e-visit has been valuable and will speed your recovery. Thank you for using e-visits.

## 2019-07-09 ENCOUNTER — Other Ambulatory Visit: Payer: Self-pay

## 2019-07-09 ENCOUNTER — Encounter: Payer: Self-pay | Admitting: Family Medicine

## 2019-07-09 ENCOUNTER — Ambulatory Visit: Payer: 59 | Admitting: Family Medicine

## 2019-07-09 VITALS — BP 122/72 | HR 100 | Temp 96.9°F | Resp 18 | Wt 289.0 lb

## 2019-07-09 DIAGNOSIS — F3341 Major depressive disorder, recurrent, in partial remission: Secondary | ICD-10-CM

## 2019-07-09 DIAGNOSIS — Z23 Encounter for immunization: Secondary | ICD-10-CM

## 2019-07-09 DIAGNOSIS — M1710 Unilateral primary osteoarthritis, unspecified knee: Secondary | ICD-10-CM

## 2019-07-09 DIAGNOSIS — Z6837 Body mass index (BMI) 37.0-37.9, adult: Secondary | ICD-10-CM

## 2019-07-09 DIAGNOSIS — K219 Gastro-esophageal reflux disease without esophagitis: Secondary | ICD-10-CM

## 2019-07-09 DIAGNOSIS — M5136 Other intervertebral disc degeneration, lumbar region: Secondary | ICD-10-CM

## 2019-07-09 NOTE — Progress Notes (Signed)
Patient: Lisa Stein Female    DOB: [birth year]   58 y.o.   MRN: [id number] Visit Date: 07/09/2019  Today's Provider: [name], MD   Chief Complaint  Patient presents with  . Depression   Subjective:     HPI  4 Month follow up for depression.  She is hanging in there.  Stable emotionally.  She is enjoyed working from home.  Her back is much better since this started.  Family issues including a son who probably will need dialysis and a grandchild with aplastic anemia.  Her husband plans to retire at the end of this year. She is ready to make some changes and to lose weight.  She wants to exercise but feels like she must lose weight with diet first before she is able to tolerate any exercise due to arthritis and DDD  Allergies  Allergen Reactions  . Amitriptyline Swelling  . Benzoin     Blisters.  . Biaxin [Clarithromycin] Diarrhea    GI upset  . Sulfa Antibiotics Itching  . Tegretol  [Carbamazepine] Itching  . Betadine  [Povidone Iodine] Rash     Current Outpatient Medications:  .  ALPRAZolam (XANAX) 0.5 MG tablet, Take 1 tablet (0.5 mg total) by mouth 2 (two) times daily as needed for anxiety., Disp: 60 tablet, Rfl: 3 .  busPIRone (BUSPAR) 10 MG tablet, Take 1 tablet (10 mg total) by mouth 2 (two) times daily., Disp: 180 tablet, Rfl: 3 .  celecoxib (CELEBREX) 200 MG capsule, TAKE 1 CAPSULE BY MOUTH  DAILY, Disp: 90 capsule, Rfl: 3 .  Cholecalciferol (VITAMIN D3) 2000 UNITS TABS, Take 2,000 Units by mouth as needed. , Disp: , Rfl:  .  DiphenhydrAMINE HCl, Sleep, 50 MG CAPS, Take 50 mg by mouth at bedtime as needed. , Disp: , Rfl:  .  estradiol (ESTRACE) 1 MG tablet, TAKE 1 TABLET BY MOUTH  DAILY, Disp: 90 tablet, Rfl: 3 .  fluticasone (FLONASE) 50 MCG/ACT nasal spray, Place 1 spray into the nose daily as needed. , Disp: , Rfl:  .  omeprazole (PRILOSEC) 20 MG capsule, TAKE 1 CAPSULE BY MOUTH TWO TIMES DAILY WITH FOOD, Disp: 180 capsule, Rfl: 3 .  sertraline  (ZOLOFT) 100 MG tablet, TAKE 2 TABLETS BY MOUTH  DAILY, Disp: 180 tablet, Rfl: 3 .  benzonatate (TESSALON) 100 MG capsule, Take 1 capsule (100 mg total) by mouth 3 (three) times daily as needed for cough. (Patient not taking: Reported on 07/09/2019), Disp: 20 capsule, Rfl: 0 .  hydrocortisone 1 % lotion, Apply 1 application topically 2 (two) times daily. (Patient not taking: Reported on 07/09/2019), Disp: 118 mL, Rfl: 0 .  pregabalin (LYRICA) 50 MG capsule, Take 1 capsule (50 mg total) by mouth 2 (two) times daily. (Patient not taking: Reported on 11/27/2018), Disp: 60 capsule, Rfl: 11  Current Facility-Administered Medications:  .  methylPREDNISolone acetate (DEPO-MEDROL) injection 80 mg, 80 mg, Intramuscular, Once, Jerrol Banana., MD  Review of Systems  Social History   Tobacco Use  . Smoking status: Former Smoker    Years: 5.00  . Smokeless tobacco: Never Used  . Tobacco comment: Quit smoking in 1980's  Substance Use Topics  . Alcohol use: Yes    Alcohol/week: 1.0 standard drinks    Types: 1 Glasses of wine per week    Comment: occasional      Objective:   BP 122/72 (BP Location: Left Arm, Patient Position: Sitting, Cuff Size: Large)  Pulse 100   Temp (!) 96.9 F (36.1 C) (Temporal)   Resp 18   Wt 289 lb (131.1 kg)   SpO2 94%   BMI 38.13 kg/m  Vitals:   07/09/19 1505  BP: 122/72  Pulse: 100  Resp: 18  Temp: (!) 96.9 F (36.1 C)  TempSrc: Temporal  SpO2: 94%  Weight: 289 lb (131.1 kg)  Body mass index is 38.13 kg/m.   Physical Exam Vitals signs reviewed.  Constitutional:      Appearance: Normal appearance.  HENT:     Head: Normocephalic and atraumatic.     Right Ear: External ear normal.     Left Ear: External ear normal.     Nose: Nose normal.  Eyes:     Conjunctiva/sclera: Conjunctivae normal.  Cardiovascular:     Rate and Rhythm: Normal rate and regular rhythm.     Pulses: Normal pulses.     Heart sounds: Normal heart sounds.  Pulmonary:      Breath sounds: Normal breath sounds.  Abdominal:     Palpations: Abdomen is soft.  Skin:    General: Skin is warm and dry.  Neurological:     Mental Status: She is alert and oriented to person, place, and time. Mental status is at baseline.  Psychiatric:        Mood and Affect: Mood normal.        Behavior: Behavior normal.        Thought Content: Thought content normal.        Judgment: Judgment normal.      No results found for any visits on 07/09/19.     Assessment & Plan    1. Need for immunization against influenza  - Flu Vaccine QUAD 36+ mos IM  2. Class 2 severe obesity due to excess calories with serious comorbidity and body mass index (BMI) of 37.0 to 37.9 in adult [facility]) Patient is going to try wheat belly which is a book for her new diet plan.  She knows she must lose weight.  She is presently at 289 pounds with a BMI of 38.13  3. Recurrent major depressive disorder, in partial remission (HCC) Stable. More than 50% 25 minute visit spent in counseling or coordination of care   4. Gastroesophageal reflux disease, unspecified whether esophagitis present   5. DDD (degenerative disc disease), lumbar Improved since she has been working at home  6. Primary osteoarthritis of knee, unspecified laterality Presently stable.      Cranford Mon, MD  [facility]

## 2019-07-24 ENCOUNTER — Encounter: Payer: Self-pay | Admitting: Family Medicine

## 2019-07-26 ENCOUNTER — Other Ambulatory Visit: Payer: Self-pay | Admitting: Family Medicine

## 2019-08-19 ENCOUNTER — Encounter: Payer: Self-pay | Admitting: Family Medicine

## 2019-08-19 ENCOUNTER — Other Ambulatory Visit: Payer: Self-pay

## 2019-09-04 ENCOUNTER — Telehealth: Payer: Self-pay

## 2019-09-04 DIAGNOSIS — R200 Anesthesia of skin: Secondary | ICD-10-CM

## 2019-09-04 DIAGNOSIS — R202 Paresthesia of skin: Secondary | ICD-10-CM

## 2019-09-04 NOTE — Telephone Encounter (Signed)
Copied from [place] [id number]. Topic: General - Other >> Sep 04, 2019  3:10 PM Wynetta Emery, [place] C wrote: Reason for CRM: pt called in for assistance. Pt says that she was sent to a vascular provider for her condition but cant recall who that provider is. Pt would like to have another referral sent to that provider, she is still having hand numbness.  Please assist pt.

## 2019-09-04 NOTE — Telephone Encounter (Signed)
Do you remember which Vascular it was?

## 2019-09-12 NOTE — Telephone Encounter (Signed)
Pt called and is requesting to have an update on this referral. Pt states she would be okay to see another doctor. Pt states she has been experiencing numbness in her ankles for months and is wanting to see someone as soon as possible. Please advise.

## 2019-09-12 NOTE — Telephone Encounter (Signed)
From [facility] 

## 2019-09-13 NOTE — Telephone Encounter (Signed)
Referral has been re ordered for the patient

## 2019-10-01 ENCOUNTER — Other Ambulatory Visit: Payer: Self-pay

## 2019-10-01 ENCOUNTER — Encounter (INDEPENDENT_AMBULATORY_CARE_PROVIDER_SITE_OTHER): Payer: Self-pay | Admitting: Vascular Surgery

## 2019-10-01 ENCOUNTER — Ambulatory Visit (INDEPENDENT_AMBULATORY_CARE_PROVIDER_SITE_OTHER): Payer: Managed Care, Other (non HMO) | Admitting: Vascular Surgery

## 2019-10-01 VITALS — BP 126/84 | HR 77 | Resp 18 | Ht 73.0 in | Wt 287.0 lb

## 2019-10-01 DIAGNOSIS — I7300 Raynaud's syndrome without gangrene: Secondary | ICD-10-CM | POA: Diagnosis not present

## 2019-10-01 DIAGNOSIS — R200 Anesthesia of skin: Secondary | ICD-10-CM | POA: Insufficient documentation

## 2019-10-01 DIAGNOSIS — R202 Paresthesia of skin: Secondary | ICD-10-CM

## 2019-10-01 DIAGNOSIS — I890 Lymphedema, not elsewhere classified: Secondary | ICD-10-CM | POA: Diagnosis not present

## 2019-10-01 DIAGNOSIS — M5136 Other intervertebral disc degeneration, lumbar region: Secondary | ICD-10-CM

## 2019-10-01 NOTE — Assessment & Plan Note (Signed)
Certainly her lower extremity symptoms could be related to nerve damage from her back issues but we must rule out vascular disease as well.

## 2019-10-01 NOTE — Patient Instructions (Signed)

## 2019-10-01 NOTE — Assessment & Plan Note (Signed)
Symptoms minimal at this point

## 2019-10-01 NOTE — Assessment & Plan Note (Signed)
Patient has left lower leg and foot numbness but is not entirely clear in its etiology.  Although her spine disease could be playing a role, it is very important we assess her vascular system as well.  Arterial and venous studies will be performed in the near future at her convenience.  We discussed the natural history and pathophysiology of both venous and arterial disease today.  The patient will return after her noninvasive studies.

## 2019-10-01 NOTE — Progress Notes (Signed)
Patient ID: Lisa Stein, female   DOB: 08-Aug-1961, 58 y.o.   MRN: [id number]  Chief Complaint  Patient presents with  . New Patient (Initial Visit)    HPI Lisa Stein is a 58 y.o. female.  I am asked to see the patient by Dr. Rosanna Randy for evaluation of numbness of her left foot and ankle.  She has a longstanding history of lymphedema with a left leg more swollen of the 2 legs.  Her mother and other family members have had lymphedema and she has managed this well for many years.  She wears daily compression stockings.  She has been working from home and elevating her legs in a chair much more than normal and that has resulted in improvement in the swelling in her legs, but the numbness is a new problem over the last few months.  No clear cause or inciting event other than working from home.  She does have a previous history of back disease and has had spinal fusion in the past.  She sees a neurologist as well.  She has not had her circulation checked and was referred for further evaluation of her lower extremity circulation which seems very appropriate.  No ulceration or infection.  No fever or chills.     Past Medical History:  Diagnosis Date  . Anxiety   . Depression   . GERD (gastroesophageal reflux disease)   . History of kidney stones   . Lymphedema   . Neuralgia   . Osteoarthritis   . Pneumonia   . Raynaud disease     Past Surgical History:  Procedure Laterality Date  . ABDOMINAL HYSTERECTOMY  2003  . ANKLE FRACTURE SURGERY Left 2010  . Bunionectomy Right   . 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  . COLONOSCOPY  2010   Dr. Vira Agar  . COLONOSCOPY  08-07-14   Dr Bary Castilla  . EXTRACORPOREAL SHOCK WAVE LITHOTRIPSY Right 04/26/2018   Procedure: EXTRACORPOREAL SHOCK WAVE LITHOTRIPSY (ESWL);  Surgeon: Hollice Espy, MD;  Location: [facility] ORS;  Service: Urology;  Laterality:  Right;  . HEMICOLECTOMY  08/08/14  . JOINT REPLACEMENT    . REFRACTIVE SURGERY Left   . REPLACEMENT TOTAL KNEE Left 2009  . sinus problem  2004   Sinuses opened up  . SPINE SURGERY  08/02/2016   spinal fusion - [facility]  . TOOTH EXTRACTION Right 2016  . TOTAL SHOULDER REPLACEMENT Left 2008,2009     Family History  Problem Relation Age of Onset  . Hypertension Mother   . Arthritis Mother   . COPD Mother   . Raynaud syndrome Mother   . Kidney failure Mother   . Heart failure Mother   . Cancer Father        lung & liver cancer  . Raynaud syndrome Sister   . Raynaud syndrome Sister     Social History   Tobacco Use  . Smoking status: Former Smoker    Years: 5.00  . Smokeless tobacco: Never Used  . Tobacco comment: Quit smoking in 1980's  Substance Use Topics  . Alcohol use: Yes    Alcohol/week: 1.0 standard drinks    Types: 1 Glasses of wine per week    Comment: occasional  . Drug use: No     Allergies  Allergen Reactions  . Amitriptyline Swelling  . Benzoin Dermatitis    Blisters. Blisters.  . Biaxin [Clarithromycin]  Diarrhea    GI upset  . Sulfa Antibiotics Itching  . Tegretol  [Carbamazepine] Itching  . Betadine  [Povidone Iodine] Rash    Current Outpatient Medications  Medication Sig Dispense Refill  . ALPRAZolam (XANAX) 0.5 MG tablet Take 1 tablet (0.5 mg total) by mouth 2 (two) times daily as needed for anxiety. 60 tablet 3  . busPIRone (BUSPAR) 10 MG tablet Take 1 tablet (10 mg total) by mouth 2 (two) times daily. 180 tablet 3  . celecoxib (CELEBREX) 200 MG capsule TAKE 1 CAPSULE BY MOUTH  DAILY 90 capsule 3  . Cholecalciferol (VITAMIN D3) 2000 UNITS TABS Take 2,000 Units by mouth as needed.     . DiphenhydrAMINE HCl, Sleep, 50 MG CAPS Take 50 mg by mouth at bedtime as needed.     Marland Kitchen estradiol (ESTRACE) 1 MG tablet TAKE 1 TABLET BY MOUTH  DAILY 90 tablet 3  . fluticasone (FLONASE) 50 MCG/ACT nasal spray Place 1 spray into the nose daily as needed.     Marland Kitchen  omeprazole (PRILOSEC) 20 MG capsule TAKE 1 CAPSULE BY MOUTH TWO TIMES DAILY WITH FOOD 180 capsule 3  . sertraline (ZOLOFT) 100 MG tablet TAKE 2 TABLETS BY MOUTH  DAILY 180 tablet 3   Current Facility-Administered Medications  Medication Dose Route Frequency Provider Last Rate Last Admin  . methylPREDNISolone acetate (DEPO-MEDROL) injection 80 mg  80 mg Intramuscular Once Jerrol Banana., MD          REVIEW OF SYSTEMS (Negative unless checked)  Constitutional: [] Weight loss  [] Fever  [] Chills Cardiac: [] Chest pain   [] Chest pressure   [] Palpitations   [] Shortness of breath when laying flat   [] Shortness of breath at rest   [] Shortness of breath with exertion. Vascular:  [] Pain in legs with walking   [] Pain in legs at rest   [] Pain in legs when laying flat   [] Claudication   [] Pain in feet when walking  [] Pain in feet at rest  [] Pain in feet when laying flat   [] History of DVT   [] Phlebitis   [] Swelling in legs   [] Varicose veins   [] Non-healing ulcers Pulmonary:   [] Uses home oxygen   [] Productive cough   [] Hemoptysis   [] Wheeze  [] COPD   [] Asthma Neurologic:  [] Dizziness  [] Blackouts   [] Seizures   [] History of stroke   [] History of TIA  [] Aphasia   [] Temporary blindness   [] Dysphagia   [x] Weakness or numbness in arms   [x] Weakness or numbness in legs Musculoskeletal:  [x] Arthritis   [] Joint swelling   [] Joint pain   [] Low back pain Hematologic:  [] Easy bruising  [] Easy bleeding   [] Hypercoagulable state   [] Anemic  [] Hepatitis Gastrointestinal:  [] Blood in stool   [] Vomiting blood  [x] Gastroesophageal reflux/heartburn   [] Abdominal pain Genitourinary:  [] Chronic kidney disease   [] Difficult urination  [] Frequent urination  [] Burning with urination   [] Hematuria Skin:  [] Rashes   [] Ulcers   [] Wounds Psychological:  [x] History of anxiety   [x]  History of major depression.    Physical Exam BP 126/84 (BP Location: Left Arm)   Pulse 77   Resp 18   Ht 6\' 1"  (1.854 m)   Wt 287 lb  (130.2 kg)   BMI 37.87 kg/m  Gen:  WD/WN, NAD Head: [place]/AT, No temporalis wasting.  Ear/Nose/Throat: Hearing grossly intact, nares w/o erythema or drainage, oropharynx w/o Erythema/Exudate Eyes: Conjunctiva clear, sclera non-icteric  Neck: trachea midline.  No JVD.  Pulmonary:  Good air movement, respirations not labored,  no use of accessory muscles  Cardiac: RRR, no JVD Vascular:  Vessel Right Left  Radial Palpable Palpable                          DP  2+  1+  PT  trace  trace   Gastrointestinal:. No masses, surgical incisions, or scars. Musculoskeletal: M/S 5/5 throughout.  Extremities without ischemic changes.  No deformity or atrophy.  1+ right lower extremity edema, 1-2+ left lower extremity edema. Neurologic: Sensation grossly intact in extremities.  Symmetrical.  Speech is fluent. Motor exam as listed above. Psychiatric: Judgment intact, Mood & affect appropriate for pt's clinical situation. Dermatologic: No rashes or ulcers noted.  No cellulitis or open wounds.    Radiology No results found.  Labs No results found for this or any previous visit (from the past 2160 hour(s)).  Assessment/Plan:  Raynaud's syndrome Symptoms minimal at this point  DDD (degenerative disc disease), lumbar Certainly her lower extremity symptoms could be related to nerve damage from her back issues but we must rule out vascular disease as well.  Acquired lymphedema The patient has relatively good control of her lymphedema with compression and elevation.  We discussed a lymphedema pump if her symptoms worsen.  Numbness and tingling of foot Patient has left lower leg and foot numbness but is not entirely clear in its etiology.  Although her spine disease could be playing a role, it is very important we assess her vascular system as well.  Arterial and venous studies will be performed in the near future at her convenience.  We discussed the natural history and pathophysiology of both venous  and arterial disease today.  The patient will return after her noninvasive studies.      Leotis Pain 10/01/2019, 9:57 AM   This note was created with Dragon medical transcription system.  Any errors from dictation are unintentional.

## 2019-10-01 NOTE — Assessment & Plan Note (Signed)
The patient has relatively good control of her lymphedema with compression and elevation.  We discussed a lymphedema pump if her symptoms worsen.

## 2019-10-23 ENCOUNTER — Other Ambulatory Visit: Payer: Self-pay

## 2019-10-23 ENCOUNTER — Ambulatory Visit (INDEPENDENT_AMBULATORY_CARE_PROVIDER_SITE_OTHER): Payer: No Typology Code available for payment source | Admitting: Nurse Practitioner

## 2019-10-23 ENCOUNTER — Encounter (INDEPENDENT_AMBULATORY_CARE_PROVIDER_SITE_OTHER): Payer: Self-pay | Admitting: Nurse Practitioner

## 2019-10-23 ENCOUNTER — Ambulatory Visit (INDEPENDENT_AMBULATORY_CARE_PROVIDER_SITE_OTHER): Payer: No Typology Code available for payment source

## 2019-10-23 VITALS — BP 159/71 | HR 73 | Ht 73.0 in | Wt 288.0 lb

## 2019-10-23 DIAGNOSIS — R202 Paresthesia of skin: Secondary | ICD-10-CM

## 2019-10-23 DIAGNOSIS — R200 Anesthesia of skin: Secondary | ICD-10-CM

## 2019-10-23 DIAGNOSIS — I890 Lymphedema, not elsewhere classified: Secondary | ICD-10-CM

## 2019-10-29 ENCOUNTER — Encounter (INDEPENDENT_AMBULATORY_CARE_PROVIDER_SITE_OTHER): Payer: Self-pay | Admitting: Nurse Practitioner

## 2019-10-29 NOTE — Progress Notes (Signed)
SUBJECTIVE:  Patient ID: Lisa Stein, female    DOB: 05-12-1961, 59 y.o.   MRN: [id number] Chief Complaint  Patient presents with  . Follow-up    pt conv.  BLE VEN reflux ABI     HPI  Lisa Stein is a 59 y.o. female that presents today for follow up related to numbness and tingling of her left foot and ankle.  The patient has a longstanding previous history of lymphedema.  This is hereditary as her mother and other family members have lymphedema as well.  The patient has good management of her lymphedema utilizing conservative methods such as wearing compression stockings and utilizing frequent elevation.  The patient states that since working from home lately her swelling has actually been much better under control since she has been able to elevate much more.  The patient does have a previous history of back disease as well as a spinal fusion in the past.  The patient also has a history of Raynaud's disease which also seems to be hereditary as well.  The patient has previously had a left knee replacement and a broken ankle.  Today the patient underwent noninvasive studies including bilateral ABIs.  The ABIs were 1.29 bilaterally with triphasic waveforms in the bilateral tibial arteries and strong toe waveforms bilaterally.  The patient also underwent a bilateral venous reflux study which revealed reflux in the common femoral vein bilaterally.  On the left lower extremity she has reflux in the great saphenous vein at the saphenofemoral junction.  There was no evidence of DVT or superficial venous thrombosis bilaterally.  Past Medical History:  Diagnosis Date  . Anxiety   . Depression   . GERD (gastroesophageal reflux disease)   . History of kidney stones   . Lymphedema   . Neuralgia   . Osteoarthritis   . Pneumonia   . Raynaud disease     Past Surgical History:  Procedure Laterality Date  . ABDOMINAL HYSTERECTOMY  2003  . ANKLE FRACTURE SURGERY Left 2010  . Bunionectomy  Right   . 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  . COLONOSCOPY  2010   Dr. Vira Agar  . COLONOSCOPY  08-07-14   Dr Bary Castilla  . EXTRACORPOREAL SHOCK WAVE LITHOTRIPSY Right 04/26/2018   Procedure: EXTRACORPOREAL SHOCK WAVE LITHOTRIPSY (ESWL);  Surgeon: Hollice Espy, MD;  Location: [facility] ORS;  Service: Urology;  Laterality: Right;  . HEMICOLECTOMY  08/08/14  . JOINT REPLACEMENT    . REFRACTIVE SURGERY Left   . REPLACEMENT TOTAL KNEE Left 2009  . sinus problem  2004   Sinuses opened up  . SPINE SURGERY  08/02/2016   spinal fusion - [facility]  . TOOTH EXTRACTION Right 2016  . TOTAL SHOULDER REPLACEMENT Left 2008,2009    Social History   Socioeconomic History  . Marital status: Married    Spouse name: Not on file  . Number of children: Not on file  . Years of education: Not on file  . Highest education level: Not on file  Occupational History  . Not on file  Tobacco Use  . Smoking status: Former Smoker    Years: 5.00  . Smokeless tobacco: Never Used  . Tobacco comment: Quit smoking in 1980's  Substance and Sexual Activity  . Alcohol use: Yes    Alcohol/week: 1.0 standard drinks    Types: 1 Glasses of wine per week    Comment: occasional  .  Drug use: No  . Sexual activity: Not on file  Other Topics Concern  . Not on file  Social History Narrative   Lives at home with husband and family. Independent at baseline   Social Determinants of Health   Financial Resource Strain:   . Difficulty of Paying Living Expenses: Not on file  Food Insecurity:   . Worried About Charity fundraiser in the Last Year: Not on file  . Ran Out of Food in the Last Year: Not on file  Transportation Needs:   . Lack of Transportation (Medical): Not on file  . Lack of Transportation (Non-Medical): Not on file  Physical Activity:   . Days of Exercise per Week: Not on file  . Minutes of Exercise per  Session: Not on file  Stress:   . Feeling of Stress : Not on file  Social Connections:   . Frequency of Communication with Friends and Family: Not on file  . Frequency of Social Gatherings with Friends and Family: Not on file  . Attends Religious Services: Not on file  . Active Member of Clubs or Organizations: Not on file  . Attends Archivist Meetings: Not on file  . Marital Status: Not on file  Intimate Partner Violence:   . Fear of Current or Ex-Partner: Not on file  . Emotionally Abused: Not on file  . Physically Abused: Not on file  . Sexually Abused: Not on file    Family History  Problem Relation Age of Onset  . Hypertension Mother   . Arthritis Mother   . COPD Mother   . Raynaud syndrome Mother   . Kidney failure Mother   . Heart failure Mother   . Cancer Father        lung & liver cancer  . Raynaud syndrome Sister   . Raynaud syndrome Sister     Allergies  Allergen Reactions  . Amitriptyline Swelling  . Benzoin Dermatitis    Blisters. Blisters.  . Biaxin [Clarithromycin] Diarrhea    GI upset  . Sulfa Antibiotics Itching  . Tegretol  [Carbamazepine] Itching  . Betadine  [Povidone Iodine] Rash     Review of Systems   Review of Systems: Negative Unless Checked Constitutional: [] Weight loss  [] Fever  [] Chills Cardiac: [] Chest pain   []  Atrial Fibrillation  [] Palpitations   [] Shortness of breath when laying flat   [] Shortness of breath with exertion. [] Shortness of breath at rest Vascular:  [] Pain in legs with walking   [] Pain in legs with standing [] Pain in legs when laying flat   [] Claudication    [] Pain in feet when laying flat    [] History of DVT   [] Phlebitis   [x] Swelling in legs   [] Varicose veins   [] Non-healing ulcers Pulmonary:   [] Uses home oxygen   [] Productive cough   [] Hemoptysis   [] Wheeze  [] COPD   [] Asthma Neurologic:  [] Dizziness   [] Seizures  [] Blackouts [] History of stroke   [] History of TIA  [] Aphasia   [] Temporary Blindness    [] Weakness or numbness in arm   [] Weakness or numbness in leg Musculoskeletal:   [] Joint swelling   [] Joint pain   [] Low back pain  []  History of Knee Replacement [x] Arthritis [] back Surgeries  []  Spinal Stenosis    Hematologic:  [] Easy bruising  [] Easy bleeding   [] Hypercoagulable state   [] Anemic Gastrointestinal:  [] Diarrhea   [] Vomiting  [x] Gastroesophageal reflux/heartburn   [] Difficulty swallowing. [] Abdominal pain Genitourinary:  [] Chronic kidney disease   [] Difficult urination  []   Anuric   [] Blood in urine [] Frequent urination  [] Burning with urination   [] Hematuria Skin:  [] Rashes   [] Ulcers [] Wounds Psychological:  [] History of anxiety   [x]  History of major depression  []  Memory Difficulties      OBJECTIVE:   Physical Exam  BP (!) 159/71 (BP Location: Right Arm)   Pulse 73   Ht 6\' 1"  (1.854 m)   Wt 288 lb (130.6 kg)   BMI 38.00 kg/m   Gen: WD/WN, NAD Head: [place]/AT, No temporalis wasting.  Ear/Nose/Throat: Hearing grossly intact, nares w/o erythema or drainage Eyes: PER, EOMI, sclera nonicteric.  Neck: Supple, no masses.  No JVD.  Pulmonary:  Good air movement, no use of accessory muscles.  Cardiac: RRR Vascular: +1 RLE edema, 1-2+ LLE edema Vessel Right Left  Radial Palpable Palpable               Dorsalis Pedis Palpable Palpable  Posterior Tibial Palpable Palpable   Gastrointestinal: soft, non-distended. No guarding/no peritoneal signs.  Musculoskeletal: M/S 5/5 throughout.  No deformity or atrophy.  Neurologic: Pain and light touch intact in extremities.  Symmetrical.  Speech is fluent. Motor exam as listed above. Psychiatric: Judgment intact, Mood & affect appropriate for pt's clinical situation. Dermatologic: No Venous rashes. No Ulcers Noted.  No changes consistent with cellulitis.        ASSESSMENT AND PLAN:  1. Acquired lymphedema Patient currently has excellent control with a well established regiment including medical grade 1 compression stockings,  frequent elevation..  Patient will continue with these conservative treatments.  Patient to follow up PRN.  2. Numbness and tingling of foot Patient has good arterial studies indicating that it is likely not related to perfusion issues.  The patient has a number of other issues that may cause numbness/tingling including spine disease.  We will defer to primary care for further workup   Current Outpatient Medications on File Prior to Visit  Medication Sig Dispense Refill  . ALPRAZolam (XANAX) 0.5 MG tablet Take 1 tablet (0.5 mg total) by mouth 2 (two) times daily as needed for anxiety. 60 tablet 3  . busPIRone (BUSPAR) 10 MG tablet Take 1 tablet (10 mg total) by mouth 2 (two) times daily. 180 tablet 3  . celecoxib (CELEBREX) 200 MG capsule TAKE 1 CAPSULE BY MOUTH  DAILY 90 capsule 3  . Cholecalciferol (VITAMIN D3) 2000 UNITS TABS Take 2,000 Units by mouth as needed.     . DiphenhydrAMINE HCl, Sleep, 50 MG CAPS Take 50 mg by mouth at bedtime as needed.     Marland Kitchen estradiol (ESTRACE) 1 MG tablet TAKE 1 TABLET BY MOUTH  DAILY 90 tablet 3  . fluticasone (FLONASE) 50 MCG/ACT nasal spray Place 1 spray into the nose daily as needed.     Marland Kitchen omeprazole (PRILOSEC) 20 MG capsule TAKE 1 CAPSULE BY MOUTH TWO TIMES DAILY WITH FOOD 180 capsule 3  . sertraline (ZOLOFT) 100 MG tablet TAKE 2 TABLETS BY MOUTH  DAILY 180 tablet 3   Current Facility-Administered Medications on File Prior to Visit  Medication Dose Route Frequency Provider Last Rate Last Admin  . methylPREDNISolone acetate (DEPO-MEDROL) injection 80 mg  80 mg Intramuscular Once Jerrol Banana., MD        There are no Patient Instructions on file for this visit. No follow-ups on file.   Kris Hartmann, NP  This note was completed with Sales executive.  Any errors are purely unintentional.

## 2019-11-08 NOTE — Progress Notes (Signed)
Patient: Lisa Stein Female    DOB: [birth year]   59 y.o.   MRN: [id number] Visit Date: 11/11/2019  Today's Provider: [name], MD   Chief Complaint  Patient presents with  . Depression  . Body Mass Index  . Degenerative Disc Disease   Subjective:     HPI  Patient is physically doing okay.  She is under a lot of emotional stress.  Her son needs a kidney transplant and grandson has aplastic anemia. She continues to have chronic back and leg pain and has a back MRI and CT this week at [facility]. Class 2 severe obesity due to excess calories with serious comorbidity and body mass index (BMI) of 37.0 to 37.9 in adult [facility]) From 07/09/2019-Patient is going to try wheat belly which is a book for her new diet plan.  She knows she must lose weight.  She is presently at 289 pounds with a BMI of 38.13. Weight essentially stable. Recurrent major depressive disorder, in partial remission ([place]) From 07/09/2019-Stable.   DDD (degenerative disc disease), lumbar From 07/09/2019-Improved since she has been working at home. She has follow-up with [facility] next week and MRI and CT this week Primary osteoarthritis of knee, unspecified laterality From 07/09/2019-Presently stable.   Allergies  Allergen Reactions  . Amitriptyline Swelling  . Benzoin Dermatitis    Blisters. Blisters.  . Biaxin [Clarithromycin] Diarrhea    GI upset  . Sulfa Antibiotics Itching  . Tegretol  [Carbamazepine] Itching  . Betadine  [Povidone Iodine] Rash     Current Outpatient Medications:  .  ALPRAZolam (XANAX) 0.5 MG tablet, Take 1 tablet (0.5 mg total) by mouth 2 (two) times daily as needed for anxiety., Disp: 60 tablet, Rfl: 3 .  busPIRone (BUSPAR) 10 MG tablet, Take 1 tablet (10 mg total) by mouth 2 (two) times daily., Disp: 180 tablet, Rfl: 3 .  celecoxib (CELEBREX) 200 MG capsule, TAKE 1 CAPSULE BY MOUTH  DAILY, Disp: 90 capsule, Rfl: 3 .  Cholecalciferol (VITAMIN D3) 2000 UNITS TABS, Take 2,000  Units by mouth as needed. , Disp: , Rfl:  .  DiphenhydrAMINE HCl, Sleep, 50 MG CAPS, Take 50 mg by mouth at bedtime as needed. , Disp: , Rfl:  .  estradiol (ESTRACE) 1 MG tablet, TAKE 1 TABLET BY MOUTH  DAILY, Disp: 90 tablet, Rfl: 3 .  fluticasone (FLONASE) 50 MCG/ACT nasal spray, Place 1 spray into the nose daily as needed. , Disp: , Rfl:  .  omeprazole (PRILOSEC) 20 MG capsule, TAKE 1 CAPSULE BY MOUTH TWO TIMES DAILY WITH FOOD, Disp: 180 capsule, Rfl: 3 .  sertraline (ZOLOFT) 100 MG tablet, TAKE 2 TABLETS BY MOUTH  DAILY, Disp: 180 tablet, Rfl: 3  Current Facility-Administered Medications:  .  methylPREDNISolone acetate (DEPO-MEDROL) injection 80 mg, 80 mg, Intramuscular, Once, Jerrol Banana., MD  Review of Systems  Constitutional: Negative for appetite change, chills, fatigue and fever.  HENT: Negative.   Eyes: Negative.   Respiratory: Negative for chest tightness and shortness of breath.   Cardiovascular: Negative for chest pain and palpitations.  Gastrointestinal: Negative for abdominal pain, nausea and vomiting.  Endocrine: Negative.   Musculoskeletal: Negative.   Allergic/Immunologic: Negative.   Neurological: Negative for dizziness and weakness.  Hematological: Negative.   Psychiatric/Behavioral: Negative.     Social History   Tobacco Use  . Smoking status: Former Smoker    Years: 5.00  . Smokeless tobacco: Never Used  . Tobacco comment: Quit  smoking in 1980's  Substance Use Topics  . Alcohol use: Yes    Alcohol/week: 1.0 standard drinks    Types: 1 Glasses of wine per week    Comment: occasional      Objective:   There were no vitals taken for this visit. There were no vitals filed for this visit.There is no height or weight on file to calculate BMI.   Physical Exam Vitals reviewed.  Constitutional:      Appearance: She is well-developed.  HENT:     Head: Normocephalic and atraumatic.  Eyes:     General: No scleral icterus.    Conjunctiva/sclera:  Conjunctivae normal.  Neck:     Thyroid: No thyromegaly.  Cardiovascular:     Rate and Rhythm: Normal rate and regular rhythm.     Heart sounds: Normal heart sounds.  Pulmonary:     Effort: Pulmonary effort is normal.     Breath sounds: Normal breath sounds.  Abdominal:     Palpations: Abdomen is soft.  Skin:    General: Skin is warm and dry.  Neurological:     General: No focal deficit present.     Mental Status: She is alert and oriented to person, place, and time. Mental status is at baseline.  Psychiatric:        Mood and Affect: Mood normal.        Behavior: Behavior normal.        Thought Content: Thought content normal.        Judgment: Judgment normal.      No results found for any visits on 11/11/19.     Assessment & Plan    1. Intussusception, ileocecal ([place]) Resolved postsurgically.  2. Neuritis or radiculitis due to rupture of lumbar intervertebral disc   3. DDD (degenerative disc disease), lumbar Scans this week and follow-up at [facility] next week  4. Primary osteoarthritis of knee, unspecified laterality Commended weight loss.  5. Class 2 severe obesity due to excess calories with serious comorbidity and body mass index (BMI) of 37.0 to 37.9 in adult [facility]) With osteoarthritis and GERD  6. Acquired lymphedema   7. Major depressive disorder with single episode, in partial remission (HCC) Partial remission.  No changes in medications.  He might benefit from counseling in the future.  8. Menopause      [name], MD  [facility]

## 2019-11-11 ENCOUNTER — Ambulatory Visit: Payer: No Typology Code available for payment source | Admitting: Family Medicine

## 2019-11-11 ENCOUNTER — Encounter: Payer: Self-pay | Admitting: Family Medicine

## 2019-11-11 ENCOUNTER — Other Ambulatory Visit: Payer: Self-pay

## 2019-11-11 VITALS — BP 114/80 | HR 101 | Temp 95.9°F | Wt 286.8 lb

## 2019-11-11 DIAGNOSIS — K561 Intussusception: Secondary | ICD-10-CM | POA: Diagnosis not present

## 2019-11-11 DIAGNOSIS — M5116 Intervertebral disc disorders with radiculopathy, lumbar region: Secondary | ICD-10-CM

## 2019-11-11 DIAGNOSIS — Z6837 Body mass index (BMI) 37.0-37.9, adult: Secondary | ICD-10-CM

## 2019-11-11 DIAGNOSIS — M5136 Other intervertebral disc degeneration, lumbar region: Secondary | ICD-10-CM | POA: Diagnosis not present

## 2019-11-11 DIAGNOSIS — F324 Major depressive disorder, single episode, in partial remission: Secondary | ICD-10-CM

## 2019-11-11 DIAGNOSIS — M1710 Unilateral primary osteoarthritis, unspecified knee: Secondary | ICD-10-CM | POA: Diagnosis not present

## 2019-11-11 DIAGNOSIS — Z780 Asymptomatic menopausal state: Secondary | ICD-10-CM

## 2019-11-11 DIAGNOSIS — I890 Lymphedema, not elsewhere classified: Secondary | ICD-10-CM

## 2019-11-16 IMAGING — CR DG ABDOMEN 1V
1 series · 1 of 1 positions shown · non-contrast
Comparison: 04/23/2018

CLINICAL DATA: Right ureteral stone.  Pre lithotripsy.

EXAM:
ABDOMEN - 1 VIEW

[dg abd 1 view]
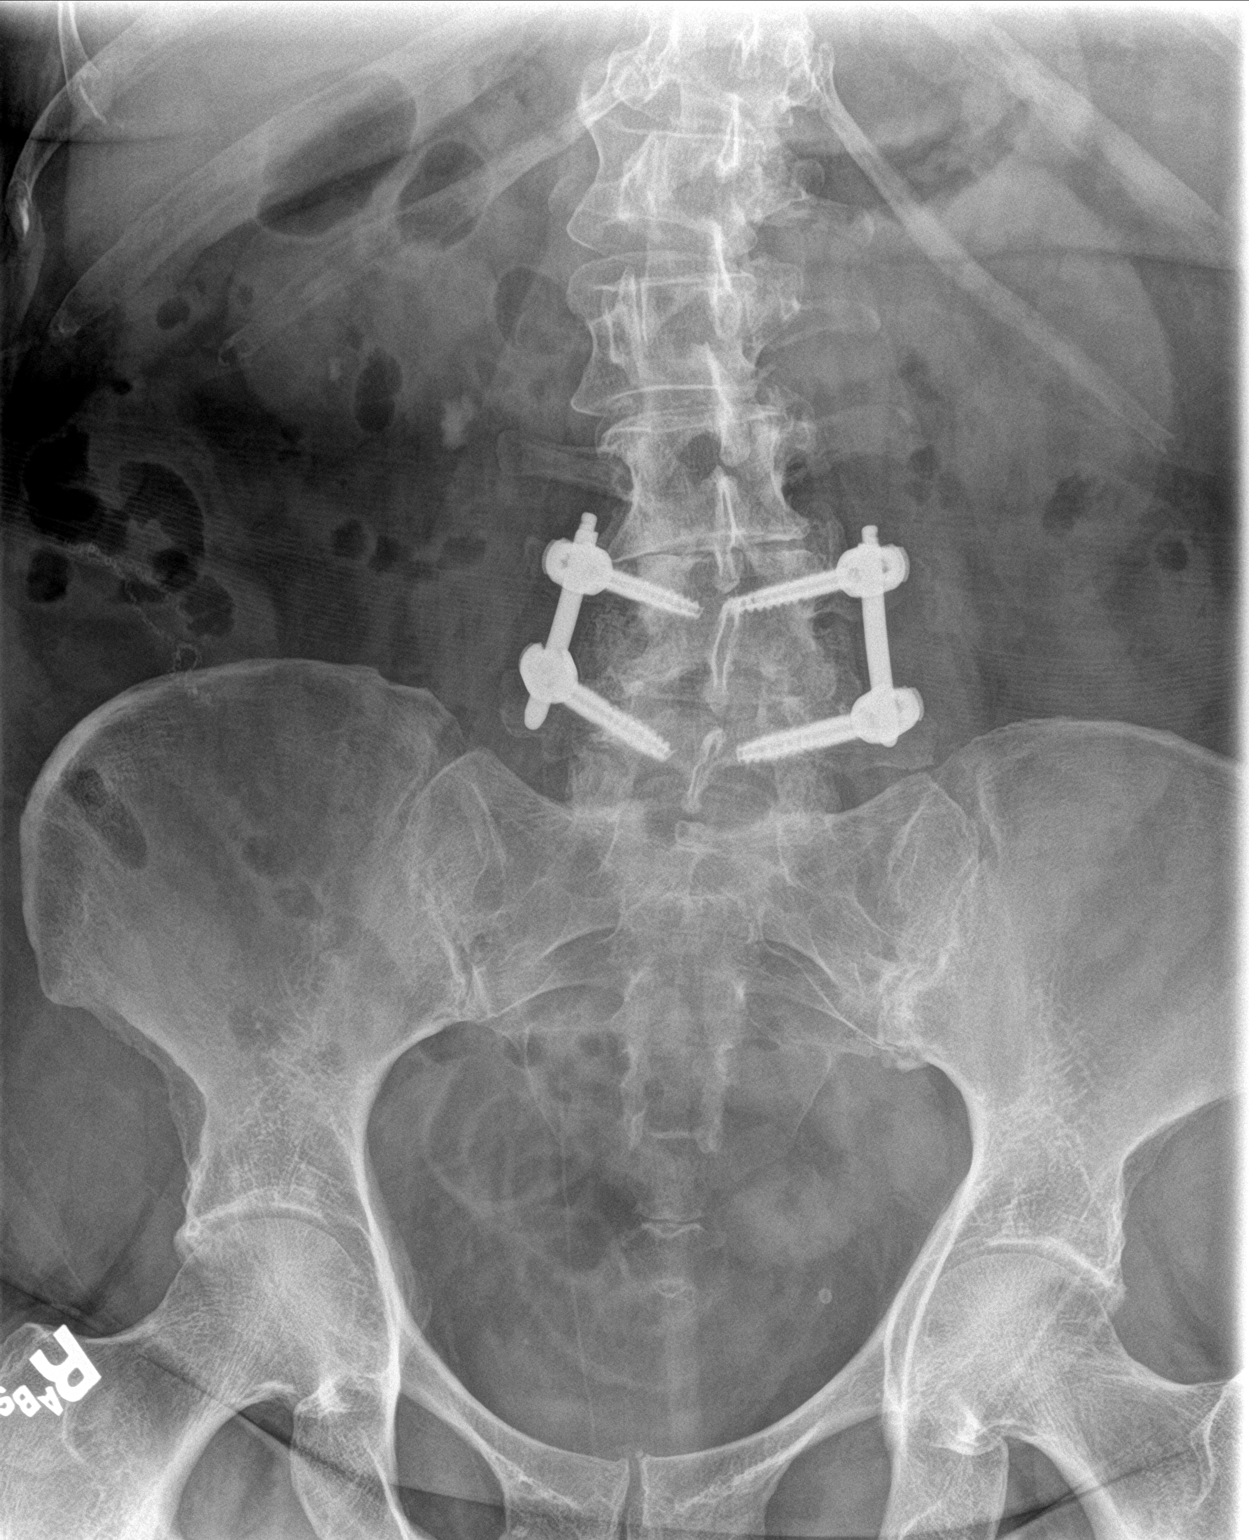

[1 of 1 positions shown; findings below may reference images not displayed]

FINDINGS: Irregular 13 mm stone projects in the expected location of the
proximal right ureter, without significant change from the prior
exam. There is a small stone that projects in the lower pole the
right kidney, which was mostly obscured by bowel gas on the prior
study. No other intrarenal or ureteral stones.

Normal bowel gas pattern.

No acute skeletal abnormality.
IMPRESSION: 13 mm stone projecting in the proximal right ureter without
significant change prior exam. Smaller nonobstructing stone in the
lower pole the right kidney.

## 2019-11-19 ENCOUNTER — Other Ambulatory Visit: Payer: Self-pay | Admitting: Family Medicine

## 2019-11-19 DIAGNOSIS — N951 Menopausal and female climacteric states: Secondary | ICD-10-CM

## 2019-11-20 MED ORDER — BUSPIRONE HCL 10 MG PO TABS: 10.0000 mg | ORAL_TABLET | Freq: Two times a day (BID) | ORAL | 1 refills | Status: DC

## 2019-11-20 NOTE — Telephone Encounter (Signed)
Please advise 

## 2019-11-20 NOTE — Telephone Encounter (Signed)
Patient called to inform the doctor that her prescription for busPIRone (BUSPAR) 10 MG tablet is out of stock per her pharmacy at [facility], who sent her an email today.  Patient would like a new script sent to her local pharmacy at  [id number] - [place], [place] - [address] Phone:  [phone]  Fax:  [phone]       Please advise and let patient know at [phone]

## 2019-11-20 NOTE — Addendum Note (Signed)
Addended by: Julieta Bellini on: 11/20/2019 02:19 PM   Modules accepted: Orders

## 2019-11-20 NOTE — Addendum Note (Signed)
Addended by: Althea Charon D on: 11/20/2019 02:00 PM   Modules accepted: Orders

## 2019-12-05 ENCOUNTER — Encounter: Payer: Self-pay | Admitting: Family Medicine

## 2019-12-19 LAB — HM MAMMOGRAPHY

## 2020-01-06 LAB — HM MAMMOGRAPHY

## 2020-01-08 ENCOUNTER — Encounter: Payer: Self-pay | Admitting: Family Medicine

## 2020-01-08 ENCOUNTER — Telehealth: Payer: Self-pay

## 2020-01-08 ENCOUNTER — Encounter: Payer: Self-pay | Admitting: *Deleted

## 2020-01-08 NOTE — Telephone Encounter (Signed)
Spoke to patient regarding message from earlier today, patient will call back when she finds out for information.

## 2020-01-08 NOTE — Telephone Encounter (Signed)
Contacted patient about message that was sent. LVMTCB.

## 2020-02-29 ENCOUNTER — Other Ambulatory Visit: Payer: Self-pay | Admitting: Family Medicine

## 2020-02-29 DIAGNOSIS — F411 Generalized anxiety disorder: Secondary | ICD-10-CM

## 2020-02-29 NOTE — Telephone Encounter (Signed)
Requested Prescriptions  Pending Prescriptions Disp Refills  . sertraline (ZOLOFT) 100 MG tablet [Pharmacy Med Name: SERTRALINE HCL 100MG  TABLET] 180 tablet 0    Sig: TAKE 2 TABLETS BY MOUTH  DAILY     Psychiatry:  Antidepressants - SSRI Failed - 02/29/2020  9:18 PM      Failed - Completed PHQ-2 or PHQ-9 in the last 360 days.      Passed - Valid encounter within last 6 months    Recent Outpatient Visits          3 months ago Intussusception, ileocecal [facility])   [facility] Jerrol Banana., MD   7 months ago Need for immunization against influenza   [facility] Jerrol Banana., MD   12 months ago Depression, unspecified depression type   [facility] Jerrol Banana., MD   1 year ago DDD (degenerative disc disease), lumbar   [facility] Jerrol Banana., MD   1 year ago Lumbar back pain   [facility] Jerrol Banana., MD             With next office visit needs updated PHQ-9.

## 2020-03-31 ENCOUNTER — Other Ambulatory Visit: Payer: Self-pay | Admitting: Family Medicine

## 2020-03-31 NOTE — Telephone Encounter (Signed)
Requested Prescriptions  Pending Prescriptions Disp Refills   omeprazole (PRILOSEC) 20 MG capsule [Pharmacy Med Name: OMEPRAZOLE  20MG   CAP] 180 capsule 3    Sig: TAKE 1 CAPSULE BY MOUTH  TWICE DAILY WITH FOOD     Gastroenterology: Proton Pump Inhibitors Passed - 03/31/2020  9:15 PM      Passed - Valid encounter within last 12 months    Recent Outpatient Visits          4 months ago Intussusception, ileocecal [facility])   [facility] Jerrol Banana., MD   8 months ago Need for immunization against influenza   [facility] Jerrol Banana., MD   1 year ago Depression, unspecified depression type   [facility] Jerrol Banana., MD   1 year ago DDD (degenerative disc disease), lumbar   [facility] Jerrol Banana., MD   1 year ago Lumbar back pain   [facility] Jerrol Banana., MD      Future Appointments            In 1 month Jerrol Banana., MD [facility] [place]

## 2020-05-05 ENCOUNTER — Other Ambulatory Visit: Payer: Self-pay | Admitting: Family Medicine

## 2020-05-05 NOTE — Telephone Encounter (Signed)
Upcoming appointment 05/12/20- on time- RF granted

## 2020-05-07 NOTE — Progress Notes (Signed)
I,Lisa Stein,acting as a scribe for Lisa Durie, MD.,have documented all relevant documentation on the behalf of Lisa Durie, MD,as directed by  Lisa Durie, MD while in the presence of Lisa Durie, MD.   Complete physical exam   Patient: Lisa Stein   DOB: 10-06-1960   59 y.o. Female  MRN: [id number] Visit Date: 05/12/2020  Today's healthcare provider: [name], MD   Chief Complaint  Patient presents with   Annual Exam   Subjective    Lisa Stein is a 59 y.o. female who presents today for a complete physical exam.  She reports consuming a general diet. The patient does not participate in regular exercise at present. She generally feels well. She reports sleeping fairly well. She does not have additional problems to discuss today.  Patient is married, she has been working 35 years with [employer].  Unfortunately, she just lost her 38-year-old grandson to aplastic anemia. HPI    Past Medical History:  Diagnosis Date   Anxiety    Depression    GERD (gastroesophageal reflux disease)    History of kidney stones    Lymphedema    Neuralgia    Osteoarthritis    Pneumonia    Raynaud disease    Past Surgical History:  Procedure Laterality Date   ABDOMINAL HYSTERECTOMY  2003   ANKLE FRACTURE SURGERY Left 2010   Bunionectomy Right    COLON SURGERY  08/08/2014   Right hemicolectomy for cecal mass on CT, suggestion of ileocolic intussusception with mucosal necrosis only.   COLONOSCOPY  2010   Dr. Vira Agar   COLONOSCOPY  08-07-14   Dr Bary Castilla   EXTRACORPOREAL SHOCK WAVE LITHOTRIPSY Right 04/26/2018   Procedure: EXTRACORPOREAL SHOCK WAVE LITHOTRIPSY (ESWL);  Surgeon: Hollice Espy, MD;  Location: [facility] ORS;  Service: Urology;  Laterality: Right;   HEMICOLECTOMY  08/08/14   JOINT REPLACEMENT     REFRACTIVE SURGERY Left    REPLACEMENT TOTAL KNEE Left 2009   sinus problem  2004   Sinuses opened up   [place]   08/02/2016   spinal fusion - [facility]   TOOTH EXTRACTION Right 2016   TOTAL SHOULDER REPLACEMENT Left 2008,2009   Social History   Socioeconomic History   Marital status: Married    Spouse name: Not on file   Number of children: Not on file   Years of education: Not on file   Highest education level: Not on file  Occupational History   Not on file  Tobacco Use   Smoking status: Former Smoker    Years: 5.00   Smokeless tobacco: Never Used   Tobacco comment: Quit smoking in 1980's  Vaping Use   Vaping Use: Never used  Substance and Sexual Activity   Alcohol use: Yes    Alcohol/week: 1.0 standard drink    Types: 1 Glasses of wine per week    Comment: occasional   Drug use: No   Sexual activity: Not on file  Other Topics Concern   Not on file  Social History Narrative   Lives at home with husband and family. Independent at baseline   Social Determinants of Health   Financial Resource Strain:    Difficulty of Paying Living Expenses:   Food Insecurity:    Worried About Charity fundraiser in the Last Year:    Arboriculturist in the Last Year:   Transportation Needs:    Film/video editor (Medical):    Lack  of Transportation (Non-Medical):   Physical Activity:    Days of Exercise per Week:    Minutes of Exercise per Session:   Stress:    Feeling of Stress :   Social Connections:    Frequency of Communication with Friends and Family:    Frequency of Social Gatherings with Friends and Family:    Attends Religious Services:    Active Member of Clubs or Organizations:    Attends Music therapist:    Marital Status:   Intimate Partner Violence:    Fear of Current or Ex-Partner:    Emotionally Abused:    Physically Abused:    Sexually Abused:    Family Status  Relation Name Status   Mother  Deceased at age 93       pneumonia   Father  Deceased at age 14       [place]    Sister  Alive   Sister  Alive   Family History  Problem Relation Age of Onset   Hypertension Mother    Arthritis Mother    COPD Mother    Raynaud syndrome Mother    Kidney failure Mother    Heart failure Mother    Cancer Father        lung & liver cancer   Raynaud syndrome Sister    Raynaud syndrome Sister    Allergies  Allergen Reactions   Amitriptyline Swelling   Benzoin Dermatitis    Blisters. Blisters.   Biaxin [Clarithromycin] Diarrhea    GI upset   Sulfa Antibiotics Itching   Tegretol  [Carbamazepine] Itching   Betadine  [Povidone Iodine] Rash    Patient Care Team: Jerrol Banana., MD as PCP - General (Family Medicine) Jerrol Banana., MD (Family Medicine) Bary Castilla, Forest Gleason, MD (General Surgery)   Medications: Outpatient Medications Prior to Visit  Medication Sig   ALPRAZolam (XANAX) 0.5 MG tablet Take 1 tablet (0.5 mg total) by mouth 2 (two) times daily as needed for anxiety.   busPIRone (BUSPAR) 10 MG tablet TAKE 1 TABLET BY MOUTH TWICE A DAY   celecoxib (CELEBREX) 200 MG capsule TAKE 1 CAPSULE BY MOUTH  DAILY   Cholecalciferol (VITAMIN D3) 2000 UNITS TABS Take 3,000 Units by mouth as needed.    DiphenhydrAMINE HCl, Sleep, 50 MG CAPS Take 50 mg by mouth at bedtime as needed.    estradiol (ESTRACE) 1 MG tablet TAKE 1 TABLET BY MOUTH  DAILY   fluticasone (FLONASE) 50 MCG/ACT nasal spray Place 1 spray into the nose daily as needed.    omeprazole (PRILOSEC) 20 MG capsule TAKE 1 CAPSULE BY MOUTH  TWICE DAILY WITH FOOD   sertraline (ZOLOFT) 100 MG tablet TAKE 2 TABLETS BY MOUTH  DAILY   Facility-Administered Medications Prior to Visit  Medication Dose Route Frequency Provider   methylPREDNISolone acetate (DEPO-MEDROL) injection 80 mg  80 mg Intramuscular Once Jerrol Banana., MD    Review of Systems  Musculoskeletal: Positive for arthralgias, back pain, gait problem, joint swelling and myalgias.  Neurological:  Positive for numbness.      Objective    BP 124/84 (BP Location: Left Arm, Patient Position: Sitting, Cuff Size: Large)    Pulse 69    Temp 98.9 F (37.2 C) (Oral)    Resp 16    Ht 6\' 1"  (1.854 m)    Wt 284 lb (128.8 kg)    SpO2 97%  BMI 37.47 kg/m    Physical Exam Vitals reviewed.  Constitutional:      Appearance: She is well-developed.  HENT:     Head: Normocephalic and atraumatic.  Eyes:     General: No scleral icterus.    Conjunctiva/sclera: Conjunctivae normal.  Neck:     Thyroid: No thyromegaly.  Cardiovascular:     Rate and Rhythm: Normal rate and regular rhythm.     Heart sounds: Normal heart sounds.  Pulmonary:     Effort: Pulmonary effort is normal.     Breath sounds: Normal breath sounds.  Abdominal:     Palpations: Abdomen is soft.  Musculoskeletal:     Comments: Chronic lymphedema present.  Skin:    General: Skin is warm and dry.  Neurological:     General: No focal deficit present.     Mental Status: She is alert and oriented to person, place, and time. Mental status is at baseline.  Psychiatric:        Mood and Affect: Mood normal.        Behavior: Behavior normal.        Thought Content: Thought content normal.        Judgment: Judgment normal.   Breast, elbow, rectal exam deferred.  General Appearance:    Obese female. Alert, cooperative, in no acute distress, appears stated age   Head:    Normocephalic, without obvious abnormality, atraumatic  Eyes:    PERRL, conjunctiva/corneas clear, EOM's intact, fundi    benign, both eyes  Ears:    Normal TM's and external ear canals, both ears  Nose:   Nares normal, septum midline, mucosa normal, no drainage    or sinus tenderness  Throat:   Lips, mucosa, and tongue normal; teeth and gums normal  Neck:   Supple, symmetrical, trachea midline, no adenopathy;    thyroid:  no enlargement/tenderness/nodules; no carotid   bruit or JVD  Back:     Symmetric, no curvature, ROM normal, no CVA tenderness  Lungs:      Clear to auscultation bilaterally, respirations unlabored  Chest Wall:    No tenderness or deformity   Heart:    Normal heart rate. Normal rhythm.  No murmurs, rubs, or gallops.   Breast Exam:    deferred  Abdomen:     Soft, non-tender, bowel sounds active all four quadrants,    no masses, no organomegaly  Pelvic:    deferred  Extremities:   All extremities are intact. No cyanosis or edema  Pulses:   2+ and symmetric all extremities  Skin:   Skin color, texture, turgor normal, no rashes or lesions  Lymph nodes:   Cervical, supraclavicular, and axillary nodes normal  Neurologic:   CNII-XII intact, normal strength, sensation and reflexes    throughout     Last depression screening scores PHQ 2/9 Scores 05/12/2020 08/21/2018 10/16/2017  PHQ - 2 Score 1 0 0  PHQ- 9 Score 4 1 1    Last fall risk screening No flowsheet data found. Last Audit-C alcohol use screening Alcohol Use Disorder Test (AUDIT) 05/12/2020  1. How often do you have a drink containing alcohol? 1  2. How many drinks containing alcohol do you have on a typical day when you are drinking? 0  3. How often do you have six or more drinks on one occasion? 0  AUDIT-C Score 1  Alcohol Brief Interventions/Follow-up AUDIT Score <7 follow-up not indicated   A score of 3 or more in women, and 4 or  more in men indicates increased risk for alcohol abuse, EXCEPT if all of the points are from question 1   No results found for any visits on 05/12/20.  Assessment & Plan    Routine Health Maintenance and Physical Exam  Exercise Activities and Dietary recommendations Goals   None     Immunization History  Administered Date(s) Administered   Influenza,inj,Quad PF,6+ Mos 08/01/2016, 07/09/2019   Td 01/09/2017   Tdap 01/26/2006    Health Maintenance  Topic Date Due   COVID-19 Vaccine (1) Never done   PAP SMEAR-Modifier  01/26/2020   INFLUENZA VACCINE  05/03/2020   MAMMOGRAM  01/05/2021   COLONOSCOPY  08/07/2024    TETANUS/TDAP  01/10/2027   Hepatitis C Screening  Completed   HIV Screening  Completed    Discussed health benefits of physical activity, and encouraged her to engage in regular exercise appropriate for her age and condition.  1. Annual physical exam  - CBC w/Diff/Platelet - Comprehensive Metabolic Panel (CMET) - TSH - Lipid panel - Vitamin B12 - VITAMIN D 25 Hydroxy (Vit-D Deficiency, Fractures) - Hemoglobin A1c  2. Hyperlipidemia, unspecified hyperlipidemia type  - CBC w/Diff/Platelet - Comprehensive Metabolic Panel (CMET) - TSH - Lipid panel - Vitamin B12 - VITAMIN D 25 Hydroxy (Vit-D Deficiency, Fractures) - Hemoglobin A1c  3. Hyperglycemia  - CBC w/Diff/Platelet - Comprehensive Metabolic Panel (CMET) - TSH - Lipid panel - Vitamin B12 - VITAMIN D 25 Hydroxy (Vit-D Deficiency, Fractures) - Hemoglobin A1c  4. Avitaminosis D  - CBC w/Diff/Platelet - Comprehensive Metabolic Panel (CMET) - TSH - Lipid panel - Vitamin B12 - VITAMIN D 25 Hydroxy (Vit-D Deficiency, Fractures) - Hemoglobin A1c  5. Fatigue, unspecified type  - CBC w/Diff/Platelet - Comprehensive Metabolic Panel (CMET) - TSH - Lipid panel - Vitamin B12 - VITAMIN D 25 Hydroxy (Vit-D Deficiency, Fractures) - Hemoglobin A1c  6. Colon cancer screening Patient had diagnostic colonoscopy back in 2015 due to intussusception.  However refer back to GI to see if she needs screening colonoscopy from December 2011 colonoscopy follow-up - CBC w/Diff/Platelet - Comprehensive Metabolic Panel (CMET) - TSH - Lipid panel - Vitamin B12 - VITAMIN D 25 Hydroxy (Vit-D Deficiency, Fractures) - Hemoglobin A1c - Ambulatory referral to Gastroenterology   Return in about 6 months (around 11/12/2020).        Everley Evora Cranford Mon, MD  [facility] [phone] (phone) [phone] (fax)  [place]

## 2020-05-12 ENCOUNTER — Other Ambulatory Visit: Payer: Self-pay

## 2020-05-12 ENCOUNTER — Encounter: Payer: Self-pay | Admitting: Family Medicine

## 2020-05-12 ENCOUNTER — Ambulatory Visit (INDEPENDENT_AMBULATORY_CARE_PROVIDER_SITE_OTHER): Payer: No Typology Code available for payment source | Admitting: Family Medicine

## 2020-05-12 VITALS — BP 124/84 | HR 69 | Temp 98.9°F | Resp 16 | Ht 73.0 in | Wt 284.0 lb

## 2020-05-12 DIAGNOSIS — Z0000 Encounter for general adult medical examination without abnormal findings: Secondary | ICD-10-CM | POA: Diagnosis not present

## 2020-05-12 DIAGNOSIS — E559 Vitamin D deficiency, unspecified: Secondary | ICD-10-CM

## 2020-05-12 DIAGNOSIS — E785 Hyperlipidemia, unspecified: Secondary | ICD-10-CM

## 2020-05-12 DIAGNOSIS — R739 Hyperglycemia, unspecified: Secondary | ICD-10-CM

## 2020-05-12 DIAGNOSIS — R5383 Other fatigue: Secondary | ICD-10-CM

## 2020-05-12 DIAGNOSIS — Z1211 Encounter for screening for malignant neoplasm of colon: Secondary | ICD-10-CM

## 2020-05-19 ENCOUNTER — Other Ambulatory Visit: Payer: Self-pay | Admitting: Family Medicine

## 2020-05-19 DIAGNOSIS — F411 Generalized anxiety disorder: Secondary | ICD-10-CM

## 2020-05-19 NOTE — Telephone Encounter (Signed)
Requested Prescriptions  Pending Prescriptions Disp Refills   sertraline (ZOLOFT) 100 MG tablet [Pharmacy Med Name: SERTRALINE HCL 100MG  TABLET] 180 tablet 1    Sig: TAKE 2 TABLETS BY MOUTH  DAILY     Psychiatry:  Antidepressants - SSRI Passed - 05/19/2020  9:23 PM      Passed - Completed PHQ-2 or PHQ-9 in the last 360 days.      Passed - Valid encounter within last 6 months    Recent Outpatient Visits          1 week ago Annual physical exam   [facility] Jerrol Banana., MD   6 months ago Intussusception, ileocecal [facility])   [facility] Jerrol Banana., MD   10 months ago Need for immunization against influenza   [facility] Jerrol Banana., MD   1 year ago Depression, unspecified depression type   [facility] Jerrol Banana., MD   1 year ago DDD (degenerative disc disease), lumbar   [facility] Jerrol Banana., MD      Future Appointments            Tomorrow  [place]   In 5 months Jerrol Banana., MD [facility], [place]

## 2020-05-20 ENCOUNTER — Telehealth: Payer: No Typology Code available for payment source

## 2020-06-03 LAB — HEMOGLOBIN A1C
Est. average glucose Bld gHb Est-mCnc: 114 mg/dL
Hgb A1c MFr Bld: 5.6 % (ref 4.8–5.6)

## 2020-06-03 LAB — COMPREHENSIVE METABOLIC PANEL
ALT: 13 IU/L (ref 0–32)
AST: 18 IU/L (ref 0–40)
Albumin/Globulin Ratio: 2.1 (ref 1.2–2.2)
Albumin: 4.7 g/dL (ref 3.8–4.8)
Alkaline Phosphatase: 76 IU/L (ref 48–121)
BUN/Creatinine Ratio: 14 (ref 12–28)
BUN: 13 mg/dL (ref 8–27)
Bilirubin Total: 0.3 mg/dL (ref 0.0–1.2)
CO2: 24 mmol/L (ref 20–29)
Calcium: 9.9 mg/dL (ref 8.7–10.3)
Chloride: 102 mmol/L (ref 96–106)
Creatinine, Ser: 0.9 mg/dL (ref 0.57–1.00)
GFR calc Af Amer: 80 mL/min/{1.73_m2} (ref >59)
GFR calc non Af Amer: 69 mL/min/{1.73_m2} (ref >59)
Globulin, Total: 2.2 g/dL (ref 1.5–4.5)
Glucose: 82 mg/dL (ref 65–99)
Potassium: 4.8 mmol/L (ref 3.5–5.2)
Sodium: 144 mmol/L (ref 134–144)
Total Protein: 6.9 g/dL (ref 6.0–8.5)

## 2020-06-03 LAB — VITAMIN D 25 HYDROXY (VIT D DEFICIENCY, FRACTURES): Vit D, 25-Hydroxy: 30.2 ng/mL (ref 30.0–100.0)

## 2020-06-03 LAB — CBC WITH DIFFERENTIAL/PLATELET
Basophils Absolute: 0 10*3/uL (ref 0.0–0.2)
Basos: 1 %
EOS (ABSOLUTE): 0.1 10*3/uL (ref 0.0–0.4)
Eos: 3 %
Hematocrit: 44.9 % (ref 34.0–46.6)
Hemoglobin: 14.1 g/dL (ref 11.1–15.9)
Immature Grans (Abs): 0 10*3/uL (ref 0.0–0.1)
Immature Granulocytes: 0 %
Lymphocytes Absolute: 1.5 10*3/uL (ref 0.7–3.1)
Lymphs: 36 %
MCH: 26.2 pg — ABNORMAL LOW (ref 26.6–33.0)
MCHC: 31.4 g/dL — ABNORMAL LOW (ref 31.5–35.7)
MCV: 84 fL (ref 79–97)
Monocytes Absolute: 0.3 10*3/uL (ref 0.1–0.9)
Monocytes: 8 %
Neutrophils Absolute: 2.2 10*3/uL (ref 1.4–7.0)
Neutrophils: 52 %
Platelets: 253 10*3/uL (ref 150–450)
RBC: 5.38 x10E6/uL — ABNORMAL HIGH (ref 3.77–5.28)
RDW: 13.4 % (ref 11.7–15.4)
WBC: 4.2 10*3/uL (ref 3.4–10.8)

## 2020-06-03 LAB — LIPID PANEL
Chol/HDL Ratio: 2.8 ratio (ref 0.0–4.4)
Cholesterol, Total: 287 mg/dL — ABNORMAL HIGH (ref 100–199)
HDL: 102 mg/dL (ref >39)
LDL Chol Calc (NIH): 163 mg/dL — ABNORMAL HIGH (ref 0–99)
Triglycerides: 128 mg/dL (ref 0–149)
VLDL Cholesterol Cal: 22 mg/dL (ref 5–40)

## 2020-06-03 LAB — TSH: TSH: 1.18 u[IU]/mL (ref 0.450–4.500)

## 2020-06-03 LAB — VITAMIN B12: Vitamin B-12: 395 pg/mL (ref 232–1245)

## 2020-06-06 ENCOUNTER — Encounter: Payer: Self-pay | Admitting: Family Medicine

## 2020-06-10 ENCOUNTER — Other Ambulatory Visit: Payer: Self-pay | Admitting: *Deleted

## 2020-06-10 DIAGNOSIS — M7071 Other bursitis of hip, right hip: Secondary | ICD-10-CM

## 2020-06-10 DIAGNOSIS — M7072 Other bursitis of hip, left hip: Secondary | ICD-10-CM

## 2020-06-10 MED ORDER — PREDNISONE 10 MG PO TABS: ORAL_TABLET | ORAL | 0 refills | Status: DC

## 2020-06-15 ENCOUNTER — Other Ambulatory Visit: Payer: Self-pay | Admitting: Family Medicine

## 2020-06-15 NOTE — Telephone Encounter (Signed)
Requested Prescriptions  Pending Prescriptions Disp Refills   celecoxib (CELEBREX) 200 MG capsule [Pharmacy Med Name: CELECOXIB  200MG   CAP] 90 capsule 3    Sig: TAKE 1 CAPSULE BY MOUTH  DAILY     Analgesics:  COX2 Inhibitors Passed - 06/15/2020  9:12 PM      Passed - HGB in normal range and within 360 days    Hemoglobin  Date Value Ref Range Status  06/02/2020 14.1 11.1 - 15.9 g/dL Final         Passed - Cr in normal range and within 360 days    Creatinine  Date Value Ref Range Status  04/17/2013 1.75 (H) 0.60 - 1.30 mg/dL Final   Creatinine, Ser  Date Value Ref Range Status  06/02/2020 0.90 0.57 - 1.00 mg/dL Final         Passed - Patient is not pregnant      Passed - Valid encounter within last 12 months    Recent Outpatient Visits          1 month ago Annual physical exam   [facility] Jerrol Banana., MD   7 months ago Intussusception, ileocecal [facility])   [facility] Jerrol Banana., MD   11 months ago Need for immunization against influenza   [facility] Jerrol Banana., MD   1 year ago Depression, unspecified depression type   [facility] Jerrol Banana., MD   1 year ago DDD (degenerative disc disease), lumbar   [facility] Jerrol Banana., MD      Future Appointments            In 5 months Jerrol Banana., MD [facility] [place]

## 2020-07-27 ENCOUNTER — Other Ambulatory Visit: Payer: Self-pay | Admitting: Family Medicine

## 2020-08-03 ENCOUNTER — Other Ambulatory Visit: Payer: Self-pay

## 2020-08-04 ENCOUNTER — Other Ambulatory Visit: Payer: Self-pay

## 2020-08-04 ENCOUNTER — Ambulatory Visit: Payer: No Typology Code available for payment source | Admitting: Gastroenterology

## 2020-08-04 ENCOUNTER — Encounter: Payer: Self-pay | Admitting: Gastroenterology

## 2020-08-04 VITALS — BP 138/81 | HR 86 | Temp 97.9°F | Ht 73.0 in | Wt 280.5 lb

## 2020-08-04 DIAGNOSIS — Z9049 Acquired absence of other specified parts of digestive tract: Secondary | ICD-10-CM

## 2020-08-04 DIAGNOSIS — Z1211 Encounter for screening for malignant neoplasm of colon: Secondary | ICD-10-CM

## 2020-08-04 MED ORDER — NA SULFATE-K SULFATE-MG SULF 17.5-3.13-1.6 GM/177ML PO SOLN: 354.0000 mL | Freq: Once | ORAL | 0 refills | Status: AC

## 2020-08-04 NOTE — Progress Notes (Signed)
Cephas Darby, MD [address]  [place]  [place], [place] [address]  Main: [phone]  Fax: [phone]    Gastroenterology Consultation  Referring Provider:     Jerrol Banana.,* Primary Care Physician:  Jerrol Banana., MD Primary Gastroenterologist:  Dr. Cephas Darby Reason for Consultation:     Discuss about colonoscopy        HPI:   Lisa Stein is a 59 y.o. female referred by Dr. Rosanna Randy, Retia Passe., MD  for consultation regarding colonoscopy.  Patient had history of right hemicolectomy secondary to ulcerated lesion in the cecum which returned as benign.  No evidence of malignancy.  She underwent colonoscopy in 08/2014 which revealed partially obstructed lesion in the cecum.  Subsequently, underwent laparoscopic right hemicolectomy by Dr. Bary Castilla.  Patient recovered well postoperatively and has been doing well.  She reports having 1 bowel movement per day.  Denies abdominal pain, rectal bleeding.  Labs are unremarkable  NSAIDs: None  Antiplts/Anticoagulants/Anti thrombotics: None  GI Procedures:  Colonoscopy 09/23/2010 Colonoscopy 08/07/2014 Partial obstructive lesion in the cecum  Right hemicolectomy 08/2014 DIAGNOSIS:  A. RIGHT COLON, LAPAROSCOPIC-ASSISTED RIGHT COLECTOMY:  - SEGMENTS OF COLON SHOWING LOCALIZED MUCOSAL NECROSIS.  - APPENDIX SHOWING FIBROUS OBLITERATION OF THE LUMEN.  - 25 BENIGN LYMPH NODES.  - THE MARGINS OF EXCISION ARE VIABLE.  - NEGATIVE FOR DYSPLASIA AND MALIGNANCY.   Past Medical History:  Diagnosis Date  . Anxiety   . Depression   . GERD (gastroesophageal reflux disease)   . History of kidney stones   . Lymphedema   . Neuralgia   . Osteoarthritis   . Pneumonia   . Raynaud disease     Past Surgical History:  Procedure Laterality Date  . ABDOMINAL HYSTERECTOMY  2003  . ANKLE FRACTURE SURGERY Left 2010  . Bunionectomy Right   . COLON SURGERY  08/08/2014   Right hemicolectomy for cecal mass on CT,  suggestion of ileocolic intussusception with mucosal necrosis only.  . COLONOSCOPY  2010   Dr. Vira Agar  . COLONOSCOPY  08-07-14   Dr Bary Castilla  . EXTRACORPOREAL SHOCK WAVE LITHOTRIPSY Right 04/26/2018   Procedure: EXTRACORPOREAL SHOCK WAVE LITHOTRIPSY (ESWL);  Surgeon: Hollice Espy, MD;  Location: [facility] ORS;  Service: Urology;  Laterality: Right;  . HEMICOLECTOMY  08/08/14  . JOINT REPLACEMENT    . REFRACTIVE SURGERY Left   . REPLACEMENT TOTAL KNEE Left 2009  . sinus problem  2004   Sinuses opened up  . SPINE SURGERY  08/02/2016   spinal fusion - [facility]  . TOOTH EXTRACTION Right 2016  . TOTAL SHOULDER REPLACEMENT Left 2008,2009    Current Outpatient Medications:  .  ALPRAZolam (XANAX) 0.5 MG tablet, Take 1 tablet (0.5 mg total) by mouth 2 (two) times daily as needed for anxiety., Disp: 60 tablet, Rfl: 3 .  busPIRone (BUSPAR) 10 MG tablet, TAKE 1 TABLET BY MOUTH TWICE A DAY, Disp: 60 tablet, Rfl: 2 .  celecoxib (CELEBREX) 200 MG capsule, TAKE 1 CAPSULE BY MOUTH  DAILY, Disp: 90 capsule, Rfl: 3 .  Cholecalciferol (VITAMIN D3) 2000 UNITS TABS, Take 3,000 Units by mouth as needed. , Disp: , Rfl:  .  DiphenhydrAMINE HCl, Sleep, 50 MG CAPS, Take 50 mg by mouth at bedtime as needed. , Disp: , Rfl:  .  estradiol (ESTRACE) 1 MG tablet, TAKE 1 TABLET BY MOUTH  DAILY, Disp: 90 tablet, Rfl: 1 .  fluticasone (FLONASE) 50 MCG/ACT nasal spray, Place 1 spray  into the nose daily as needed. , Disp: , Rfl:  .  gabapentin (NEURONTIN) 100 MG capsule, Take by mouth., Disp: , Rfl:  .  omeprazole (PRILOSEC) 20 MG capsule, TAKE 1 CAPSULE BY MOUTH  TWICE DAILY WITH FOOD, Disp: 180 capsule, Rfl: 2 .  sertraline (ZOLOFT) 100 MG tablet, TAKE 2 TABLETS BY MOUTH  DAILY, Disp: 180 tablet, Rfl: 1 .  Na Sulfate-K Sulfate-Mg Sulf 17.5-3.13-1.6 GM/177ML SOLN, Take 354 mLs by mouth once for 1 dose., Disp: 354 mL, Rfl: 0  Current Facility-Administered Medications:  .  methylPREDNISolone acetate (DEPO-MEDROL) injection 80 mg,  80 mg, Intramuscular, Once, Jerrol Banana., MD   Family History  Problem Relation Age of Onset  . Hypertension Mother   . Arthritis Mother   . COPD Mother   . Raynaud syndrome Mother   . Kidney failure Mother   . Heart failure Mother   . Cancer Father        lung & liver cancer  . Raynaud syndrome Sister   . Raynaud syndrome Sister      Social History   Tobacco Use  . Smoking status: Former Smoker    Years: 5.00  . Smokeless tobacco: Never Used  . Tobacco comment: Quit smoking in 1980's  Vaping Use  . Vaping Use: Never used  Substance Use Topics  . Alcohol use: Yes    Alcohol/week: 1.0 standard drink    Types: 1 Glasses of wine per week    Comment: occasional  . Drug use: No    Allergies as of 08/04/2020 - Review Complete 08/04/2020  Allergen Reaction Noted  . Pregabalin Other (See Comments) 07/17/2020  . Amitriptyline Swelling 11/07/2018  . Benzoin Dermatitis 03/16/2015  . Biaxin [clarithromycin] Diarrhea 08/04/2014  . Sulfa antibiotics Itching 08/04/2014  . Tegretol  [carbamazepine] Itching 03/16/2015  . Betadine  [povidone iodine] Rash 03/16/2015    Review of Systems:    All systems reviewed and negative except where noted in HPI.   Physical Exam:  BP 138/81 (BP Location: Left Arm, Patient Position: Sitting, Cuff Size: Large)   Pulse 86   Temp 97.9 F (36.6 C) (Oral)   Ht 6\' 1"  (1.854 m)   Wt 280 lb 8 oz (127.2 kg)   BMI 37.01 kg/m  No LMP recorded. Patient has had a hysterectomy.  General:   Alert,  Well-developed, well-nourished, pleasant and cooperative in NAD Head:  Normocephalic and atraumatic. Eyes:  Sclera clear, no icterus.   Conjunctiva pink. Ears:  Normal auditory acuity. Nose:  No deformity, discharge, or lesions. Mouth:  No deformity or lesions,oropharynx pink & moist. Neck:  Supple; no masses or thyromegaly. Lungs:  Respirations even and unlabored.  Clear throughout to auscultation.   No wheezes, crackles, or rhonchi. No acute  distress. Heart:  Regular rate and rhythm; no murmurs, clicks, rubs, or gallops. Abdomen:  Normal bowel sounds. Soft, non-tender and non-distended without masses, hepatosplenomegaly or hernias noted.  No guarding or rebound tenderness.   Rectal: Not performed Msk:  Symmetrical without gross deformities. Good, equal movement & strength bilaterally. Pulses:  Normal pulses noted. Extremities:  No clubbing, lymphedema of the left leg.  No cyanosis. Neurologic:  Alert and oriented x3;  grossly normal neurologically. Skin:  Intact without significant lesions or rashes. No jaundice. Psych:  Alert and cooperative. Normal mood and affect.  Imaging Studies: Reviewed  Assessment and Plan:   JOSLYNE MARSHBURN is a 59 y.o. pleasant Caucasian female with lymphedema of the leg s/p right  hemicolectomy in 08/2014 secondary to partially obstructing benign lesion in the cecum.  Patient also has family history of Crohn's disease.  Recommend screening colonoscopy with evaluation of neoterminal ileum   Follow up as needed   Cephas Darby, MD

## 2020-08-05 ENCOUNTER — Ambulatory Visit: Payer: No Typology Code available for payment source | Admitting: Dietician

## 2020-08-14 ENCOUNTER — Encounter: Payer: Self-pay | Admitting: Dietician

## 2020-08-14 NOTE — Progress Notes (Signed)
Have not yet heard back from patient to reschedule her cancelled appointment from 08/03/20. Sent notification to referring provider.

## 2020-08-24 ENCOUNTER — Other Ambulatory Visit: Payer: Self-pay | Admitting: Orthopedic Surgery

## 2020-09-02 ENCOUNTER — Other Ambulatory Visit: Payer: Self-pay

## 2020-09-02 ENCOUNTER — Encounter
Admission: RE | Admit: 2020-09-02 | Discharge: 2020-09-02 | Disposition: A | Discharge status: Home / Self Care | Payer: No Typology Code available for payment source | Source: Ambulatory Visit | Attending: Orthopedic Surgery | Admitting: Orthopedic Surgery

## 2020-09-02 DIAGNOSIS — Z01818 Encounter for other preprocedural examination: Secondary | ICD-10-CM | POA: Diagnosis present

## 2020-09-02 HISTORY — DX: Other complications of anesthesia, initial encounter: T88.59XA

## 2020-09-02 HISTORY — DX: Cardiac murmur, unspecified: R01.1

## 2020-09-02 LAB — COMPREHENSIVE METABOLIC PANEL
ALT: 17 U/L (ref 0–44)
AST: 21 U/L (ref 15–41)
Albumin: 3.9 g/dL (ref 3.5–5.0)
Alkaline Phosphatase: 52 U/L (ref 38–126)
Anion gap: 12 (ref 5–15)
BUN: 17 mg/dL (ref 8–23)
CO2: 25 mmol/L (ref 22–32)
Calcium: 9.1 mg/dL (ref 8.9–10.3)
Chloride: 101 mmol/L (ref 98–111)
Creatinine, Ser: 0.73 mg/dL (ref 0.44–1.00)
GFR, Estimated: >60 mL/min (ref >60)
Glucose, Bld: 85 mg/dL (ref 70–99)
Potassium: 3.7 mmol/L (ref 3.5–5.1)
Sodium: 138 mmol/L (ref 135–145)
Total Bilirubin: 0.7 mg/dL (ref 0.3–1.2)
Total Protein: 6.9 g/dL (ref 6.5–8.1)

## 2020-09-02 LAB — CBC WITH DIFFERENTIAL/PLATELET
Abs Immature Granulocytes: 0.02 10*3/uL (ref 0.00–0.07)
Basophils Absolute: 0 10*3/uL (ref 0.0–0.1)
Basophils Relative: 0 %
Eosinophils Absolute: 0.1 10*3/uL (ref 0.0–0.5)
Eosinophils Relative: 2 %
HCT: 42 % (ref 36.0–46.0)
Hemoglobin: 13.2 g/dL (ref 12.0–15.0)
Immature Granulocytes: 0 %
Lymphocytes Relative: 37 %
Lymphs Abs: 1.9 10*3/uL (ref 0.7–4.0)
MCH: 27 pg (ref 26.0–34.0)
MCHC: 31.4 g/dL (ref 30.0–36.0)
MCV: 86.1 fL (ref 80.0–100.0)
Monocytes Absolute: 0.4 10*3/uL (ref 0.1–1.0)
Monocytes Relative: 9 %
Neutro Abs: 2.7 10*3/uL (ref 1.7–7.7)
Neutrophils Relative %: 52 %
Platelets: 244 10*3/uL (ref 150–400)
RBC: 4.88 MIL/uL (ref 3.87–5.11)
RDW: 14.5 % (ref 11.5–15.5)
WBC: 5.1 10*3/uL (ref 4.0–10.5)
nRBC: 0 % (ref 0.0–0.2)

## 2020-09-02 LAB — TYPE AND SCREEN
ABO/RH(D): A POS
Antibody Screen: NEGATIVE

## 2020-09-02 NOTE — Patient Instructions (Addendum)
INSTRUCTIONS FOR SURGERY     Your surgery is scheduled for:   Thursday, December 9TH    To find out your arrival time for the day of surgery,          please call [phone] between 1 pm and 3 pm on : Wednesday, December 8TH     When you arrive for surgery, report to the [facility]. ONCE FINISHED         HERE, YOU WILL GO TO THE SECOND FLOOR SURGICAL DESK TO SIGN IN.        REMEMBER: Instructions that are not followed completely may result in serious medical risk,  up to and including death, or upon the discretion of your surgeon and anesthesiologist,            your surgery may need to be rescheduled.  __X__ 1. Do not eat food after midnight the night before your procedure.                    No gum, candy, lozenger, tic tacs, tums or hard candies.                  ABSOLUTELY NOTHING SOLID IN YOUR MOUTH AFTER MIDNIGHT                    You may drink unlimited clear liquids up to 2 hours before you are scheduled to arrive for surgery.                   Do not drink anything within those 2 hours unless you need to take medicine, then take the                   smallest amount you need.  Clear liquids include:  water, apple juice without pulp,                   any flavor Gatorade, Black coffee, black tea.  Sugar may be added but no dairy/ honey /lemon.                        Broth and jello is not considered a clear liquid.  __x__  2. On the morning of surgery, please brush your teeth with toothpaste and water. You may rinse with                  mouthwash if you wish but DO NOT SWALLOW TOOTHPASTE OR MOUTHWASH  __X___3. NO alcohol for 24 hours before or after surgery.  __x___ 4.  Do NOT smoke or use e-cigarettes for 24 HOURS PRIOR TO SURGERY.                      DO NOT Use any chewable tobacco products for at least 6 hours prior to surgery.  __x___ 5. If you start any new medication after this appointment  and prior to surgery, please                   Bring it with you on the day of surgery.  ___x__ 6. Notify  your doctor if there is any change in your medical condition, such as fever,                   infection, vomitting, diarrhea or any open sores.  __x___ 7.  USE the CHG SOAP as instructed, the night before surgery and the day of surgery.                   Once you have washed with this soap, do NOT use any of the following: Powders, perfumes                    or lotions. Please do not wear make up, hairpins, clips or nail polish. You MAY wear deodorant.                   Men may shave their face and neck.  Women need to shave 48 hours prior to surgery.                   DO NOT wear ANY jewelry on the day of surgery. If there are rings that are too tight to                    remove easily, please address this prior to the surgery day. Piercings need to be removed.                                                                     NO METAL ON YOUR BODY.                    Do NOT bring any valuables.  If you came to Pre-Admit testing then you will not need license,                     insurance card or credit card.  If you will be staying overnight, please either leave your things in                     the car or have your family be responsible for these items.                     [facility] IS NOT RESPONSIBLE FOR BELONGINGS OR VALUABLES.  ___X__ 8. DO NOT wear contact lenses on surgery day.  You may not have dentures,                     Hearing aides, contacts or glasses in the operating room. These items can be                    Placed in the Recovery Room to receive immediately after surgery.  __x___ 9. IF YOU ARE SCHEDULED TO GO HOME ON THE SAME DAY, YOU MUST                   Have someone to drive you home and to stay with you  for the first 24 hours.                    Have an arrangement prior to arriving on surgery day.  ___x__ 10. Take the following medications on the  morning of surgery with a sip of water:                              1. PRILOSEC                     2. GABAPENTIN                     3. BUSPAR                     4. XANAX                     5. FLONASE                     6.  _____ 11.  Follow any instructions provided to you by your surgeon.                        Such as enema, clear liquid bowel prep  __X__  12. STOP ALL ASPIRIN PRODUCTS AS OF TOMORROW, 09/03/20.                        THIS INCLUDES BC POWDERS / GOODIES POWDER  __x___ 13. STOP Anti-inflammatories as of: 09/03/20.                     STOP CELEBREX AS OF TOMORROW, 09/03/20                      This includes IBUPROFEN / MOTRIN / ADVIL / ALEVE/ NAPROXYN                    YOU MAY TAKE TYLENOL ANY TIME PRIOR TO SURGERY.  __X___ 14.  You may continue taking Vitamin D3 but do not take on the morning of surgery.  __X____18. If staying overnight, please have appropriate shoes to wear to be able to walk around the unit.                   Wear clean and comfortable clothing to the hospital.  IF YOU BRING A CELL PHONE, PLEASE REMEMBER A CHARGER.

## 2020-09-03 ENCOUNTER — Ambulatory Visit: Payer: No Typology Code available for payment source | Admitting: Urgent Care

## 2020-09-08 ENCOUNTER — Encounter
Admission: RE | Admit: 2020-09-08 | Discharge: 2020-09-08 | Disposition: A | Discharge status: Home / Self Care | Payer: No Typology Code available for payment source | Source: Ambulatory Visit | Attending: Orthopedic Surgery | Admitting: Orthopedic Surgery

## 2020-09-08 ENCOUNTER — Other Ambulatory Visit: Payer: No Typology Code available for payment source

## 2020-09-08 ENCOUNTER — Other Ambulatory Visit: Payer: Self-pay

## 2020-09-08 DIAGNOSIS — Z01812 Encounter for preprocedural laboratory examination: Secondary | ICD-10-CM | POA: Insufficient documentation

## 2020-09-08 DIAGNOSIS — Z20822 Contact with and (suspected) exposure to covid-19: Secondary | ICD-10-CM | POA: Insufficient documentation

## 2020-09-08 LAB — URINALYSIS, ROUTINE W REFLEX MICROSCOPIC
Bilirubin Urine: NEGATIVE
Glucose, UA: NEGATIVE mg/dL
Hgb urine dipstick: NEGATIVE
Ketones, ur: NEGATIVE mg/dL
Leukocytes,Ua: NEGATIVE
Nitrite: NEGATIVE
Protein, ur: 30 mg/dL — AB
Specific Gravity, Urine: 1.023 (ref 1.005–1.030)
pH: 5 (ref 5.0–8.0)

## 2020-09-08 LAB — SURGICAL PCR SCREEN
MRSA, PCR: NEGATIVE
Staphylococcus aureus: NEGATIVE

## 2020-09-08 LAB — SARS CORONAVIRUS 2 (TAT 6-24 HRS): SARS Coronavirus 2: NEGATIVE

## 2020-09-09 MED ORDER — DEXTROSE 5 % IV SOLN
3.0000 g | INTRAVENOUS | Status: AC
  Administered 2020-09-10: 3 g via INTRAVENOUS
  Filled 2020-09-09: qty 3

## 2020-09-10 ENCOUNTER — Inpatient Hospital Stay: Payer: No Typology Code available for payment source | Admitting: Urgent Care

## 2020-09-10 ENCOUNTER — Other Ambulatory Visit: Payer: Self-pay

## 2020-09-10 ENCOUNTER — Encounter: Payer: Self-pay | Admitting: Orthopedic Surgery

## 2020-09-10 ENCOUNTER — Inpatient Hospital Stay: Payer: No Typology Code available for payment source

## 2020-09-10 ENCOUNTER — Encounter
Admission: RE | Disposition: A | Discharge status: Home Health | Payer: Self-pay | Source: Home / Self Care | Attending: Orthopedic Surgery

## 2020-09-10 ENCOUNTER — Inpatient Hospital Stay
Admission: RE | Admit: 2020-09-10 | Discharge: 2020-09-14 | DRG: 468 | Disposition: A | Discharge status: Home Health | Payer: No Typology Code available for payment source | Attending: Orthopedic Surgery | Admitting: Orthopedic Surgery

## 2020-09-10 DIAGNOSIS — F419 Anxiety disorder, unspecified: Secondary | ICD-10-CM | POA: Diagnosis present

## 2020-09-10 DIAGNOSIS — T84033A Mechanical loosening of internal left knee prosthetic joint, initial encounter: Secondary | ICD-10-CM | POA: Diagnosis present

## 2020-09-10 DIAGNOSIS — G8918 Other acute postprocedural pain: Secondary | ICD-10-CM

## 2020-09-10 DIAGNOSIS — Z888 Allergy status to other drugs, medicaments and biological substances status: Secondary | ICD-10-CM | POA: Diagnosis not present

## 2020-09-10 DIAGNOSIS — Z20822 Contact with and (suspected) exposure to covid-19: Secondary | ICD-10-CM | POA: Diagnosis present

## 2020-09-10 DIAGNOSIS — G629 Polyneuropathy, unspecified: Secondary | ICD-10-CM | POA: Diagnosis present

## 2020-09-10 DIAGNOSIS — G8929 Other chronic pain: Secondary | ICD-10-CM | POA: Diagnosis present

## 2020-09-10 DIAGNOSIS — Z981 Arthrodesis status: Secondary | ICD-10-CM | POA: Diagnosis not present

## 2020-09-10 DIAGNOSIS — Y831 Surgical operation with implant of artificial internal device as the cause of abnormal reaction of the patient, or of later complication, without mention of misadventure at the time of the procedure: Secondary | ICD-10-CM | POA: Diagnosis present

## 2020-09-10 DIAGNOSIS — Z882 Allergy status to sulfonamides status: Secondary | ICD-10-CM | POA: Diagnosis not present

## 2020-09-10 DIAGNOSIS — Z96659 Presence of unspecified artificial knee joint: Secondary | ICD-10-CM

## 2020-09-10 DIAGNOSIS — K219 Gastro-esophageal reflux disease without esophagitis: Secondary | ICD-10-CM | POA: Diagnosis present

## 2020-09-10 DIAGNOSIS — Z96612 Presence of left artificial shoulder joint: Secondary | ICD-10-CM | POA: Diagnosis present

## 2020-09-10 HISTORY — PX: TOTAL KNEE REVISION: SHX996

## 2020-09-10 LAB — CBC
HCT: 40.8 % (ref 36.0–46.0)
Hemoglobin: 12.4 g/dL (ref 12.0–15.0)
MCH: 26.4 pg (ref 26.0–34.0)
MCHC: 30.4 g/dL (ref 30.0–36.0)
MCV: 86.8 fL (ref 80.0–100.0)
Platelets: 225 10*3/uL (ref 150–400)
RBC: 4.7 MIL/uL (ref 3.87–5.11)
RDW: 14.3 % (ref 11.5–15.5)
WBC: 7.5 10*3/uL (ref 4.0–10.5)
nRBC: 0 % (ref 0.0–0.2)

## 2020-09-10 LAB — CREATININE, SERUM
Creatinine, Ser: 0.9 mg/dL (ref 0.44–1.00)
GFR, Estimated: >60 mL/min (ref >60)

## 2020-09-10 LAB — ABO/RH: ABO/RH(D): A POS

## 2020-09-10 SURGERY — TOTAL KNEE REVISION
Anesthesia: Spinal | Site: Knee | Laterality: Left

## 2020-09-10 MED ORDER — OXYCODONE HCL 5 MG PO TABS
5.0000 mg | ORAL_TABLET | ORAL | Status: DC | PRN
  Administered 2020-09-13 – 2020-09-14 (×3): 5 mg via ORAL
  Filled 2020-09-10: qty 2
  Filled 2020-09-10 (×3): qty 1

## 2020-09-10 MED ORDER — ALUM & MAG HYDROXIDE-SIMETH 200-200-20 MG/5ML PO SUSP: 30.0000 mL | ORAL | Status: DC | PRN

## 2020-09-10 MED ORDER — PROPOFOL 500 MG/50ML IV EMUL
INTRAVENOUS | Status: AC
  Filled 2020-09-10: qty 50

## 2020-09-10 MED ORDER — MENTHOL 3 MG MT LOZG
1.0000 | LOZENGE | OROMUCOSAL | Status: DC | PRN
  Filled 2020-09-10: qty 9

## 2020-09-10 MED ORDER — SODIUM CHLORIDE 0.9 % IV SOLN
INTRAVENOUS | Status: DC | PRN
  Administered 2020-09-10: 60 mL

## 2020-09-10 MED ORDER — GABAPENTIN 100 MG PO CAPS
200.0000 mg | ORAL_CAPSULE | Freq: Three times a day (TID) | ORAL | Status: DC
  Administered 2020-09-10 – 2020-09-11 (×5): 200 mg via ORAL
  Filled 2020-09-10 (×7): qty 2

## 2020-09-10 MED ORDER — DOCUSATE SODIUM 100 MG PO CAPS
100.0000 mg | ORAL_CAPSULE | Freq: Two times a day (BID) | ORAL | Status: DC
  Administered 2020-09-10 – 2020-09-14 (×9): 100 mg via ORAL
  Filled 2020-09-10 (×9): qty 1

## 2020-09-10 MED ORDER — NEOMYCIN-POLYMYXIN B GU 40-200000 IR SOLN
Status: AC
  Filled 2020-09-10: qty 20

## 2020-09-10 MED ORDER — ONDANSETRON HCL 4 MG/2ML IJ SOLN: 4.0000 mg | Freq: Once | INTRAMUSCULAR | Status: DC | PRN

## 2020-09-10 MED ORDER — PROPOFOL 10 MG/ML IV BOLUS
INTRAVENOUS | Status: DC | PRN
  Administered 2020-09-10: 40 mg via INTRAVENOUS

## 2020-09-10 MED ORDER — METHOCARBAMOL 500 MG PO TABS
500.0000 mg | ORAL_TABLET | Freq: Four times a day (QID) | ORAL | Status: DC | PRN
  Administered 2020-09-10 – 2020-09-13 (×2): 500 mg via ORAL
  Filled 2020-09-10 (×2): qty 1

## 2020-09-10 MED ORDER — KETAMINE HCL 50 MG/ML IJ SOLN
INTRAMUSCULAR | Status: AC
  Filled 2020-09-10: qty 10

## 2020-09-10 MED ORDER — KETAMINE HCL 50 MG/ML IJ SOLN
INTRAMUSCULAR | Status: DC | PRN
  Administered 2020-09-10 (×2): 10 mg via INTRAVENOUS

## 2020-09-10 MED ORDER — TRAMADOL HCL 50 MG PO TABS
50.0000 mg | ORAL_TABLET | Freq: Four times a day (QID) | ORAL | Status: DC
  Administered 2020-09-10 – 2020-09-14 (×13): 50 mg via ORAL
  Filled 2020-09-10 (×13): qty 1

## 2020-09-10 MED ORDER — METOCLOPRAMIDE HCL 5 MG/ML IJ SOLN
5.0000 mg | Freq: Three times a day (TID) | INTRAMUSCULAR | Status: DC | PRN
  Filled 2020-09-10: qty 2

## 2020-09-10 MED ORDER — ACETAMINOPHEN 325 MG PO TABS
325.0000 mg | ORAL_TABLET | Freq: Four times a day (QID) | ORAL | Status: DC | PRN
  Filled 2020-09-10: qty 1

## 2020-09-10 MED ORDER — ZOLPIDEM TARTRATE 5 MG PO TABS: 5.0000 mg | ORAL_TABLET | Freq: Every evening | ORAL | Status: DC | PRN

## 2020-09-10 MED ORDER — BUPIVACAINE HCL (PF) 0.5 % IJ SOLN
INTRAMUSCULAR | Status: DC | PRN
  Administered 2020-09-10: 3 mL via INTRATHECAL

## 2020-09-10 MED ORDER — SODIUM CHLORIDE (PF) 0.9 % IJ SOLN
INTRAMUSCULAR | Status: AC
  Filled 2020-09-10: qty 100

## 2020-09-10 MED ORDER — SODIUM CHLORIDE 0.9 % IV SOLN: INTRAVENOUS | Status: DC

## 2020-09-10 MED ORDER — DIPHENHYDRAMINE HCL 12.5 MG/5ML PO ELIX: 12.5000 mg | ORAL_SOLUTION | ORAL | Status: DC | PRN

## 2020-09-10 MED ORDER — MAGNESIUM HYDROXIDE 400 MG/5ML PO SUSP
30.0000 mL | Freq: Every day | ORAL | Status: DC | PRN
  Administered 2020-09-13: 30 mL via ORAL
  Filled 2020-09-10: qty 30

## 2020-09-10 MED ORDER — ONDANSETRON HCL 4 MG PO TABS: 4.0000 mg | ORAL_TABLET | Freq: Four times a day (QID) | ORAL | Status: DC | PRN

## 2020-09-10 MED ORDER — CEFAZOLIN SODIUM-DEXTROSE 2-4 GM/100ML-% IV SOLN
2.0000 g | Freq: Four times a day (QID) | INTRAVENOUS | Status: AC
  Administered 2020-09-10 (×2): 2 g via INTRAVENOUS
  Filled 2020-09-10 (×2): qty 100

## 2020-09-10 MED ORDER — CHLORHEXIDINE GLUCONATE 0.12 % MT SOLN
15.0000 mL | Freq: Once | OROMUCOSAL | Status: AC
  Administered 2020-09-10: 15 mL via OROMUCOSAL

## 2020-09-10 MED ORDER — TRANEXAMIC ACID-NACL 1000-0.7 MG/100ML-% IV SOLN
1000.0000 mg | Freq: Once | INTRAVENOUS | Status: DC
  Filled 2020-09-10: qty 100

## 2020-09-10 MED ORDER — ACETAMINOPHEN 10 MG/ML IV SOLN
INTRAVENOUS | Status: DC | PRN
  Administered 2020-09-10: 1000 mg via INTRAVENOUS

## 2020-09-10 MED ORDER — FENTANYL CITRATE (PF) 100 MCG/2ML IJ SOLN: 25.0000 ug | INTRAMUSCULAR | Status: DC | PRN

## 2020-09-10 MED ORDER — ORAL CARE MOUTH RINSE: 15.0000 mL | Freq: Once | OROMUCOSAL | Status: AC

## 2020-09-10 MED ORDER — BUPIVACAINE LIPOSOME 1.3 % IJ SUSP
INTRAMUSCULAR | Status: AC
  Filled 2020-09-10: qty 20

## 2020-09-10 MED ORDER — BUSPIRONE HCL 10 MG PO TABS
10.0000 mg | ORAL_TABLET | Freq: Two times a day (BID) | ORAL | Status: DC
  Administered 2020-09-10 – 2020-09-14 (×7): 10 mg via ORAL
  Filled 2020-09-10 (×8): qty 1

## 2020-09-10 MED ORDER — BUPIVACAINE HCL (PF) 0.5 % IJ SOLN
INTRAMUSCULAR | Status: AC
  Filled 2020-09-10: qty 10

## 2020-09-10 MED ORDER — ESTRADIOL 1 MG PO TABS
1.0000 mg | ORAL_TABLET | Freq: Every day | ORAL | Status: DC
  Administered 2020-09-10 – 2020-09-14 (×5): 1 mg via ORAL
  Filled 2020-09-10 (×5): qty 1

## 2020-09-10 MED ORDER — CHLORHEXIDINE GLUCONATE 0.12 % MT SOLN
OROMUCOSAL | Status: AC
  Filled 2020-09-10: qty 15

## 2020-09-10 MED ORDER — SODIUM CHLORIDE (PF) 0.9 % IJ SOLN
INTRAMUSCULAR | Status: AC
  Filled 2020-09-10: qty 10

## 2020-09-10 MED ORDER — METOCLOPRAMIDE HCL 10 MG PO TABS: 5.0000 mg | ORAL_TABLET | Freq: Three times a day (TID) | ORAL | Status: DC | PRN

## 2020-09-10 MED ORDER — SODIUM CHLORIDE 0.9 % IV SOLN
INTRAVENOUS | Status: DC | PRN
  Administered 2020-09-10: 20 ug/min via INTRAVENOUS

## 2020-09-10 MED ORDER — PHENOL 1.4 % MT LIQD
1.0000 | OROMUCOSAL | Status: DC | PRN
  Filled 2020-09-10: qty 177

## 2020-09-10 MED ORDER — METHOCARBAMOL 1000 MG/10ML IJ SOLN
500.0000 mg | Freq: Four times a day (QID) | INTRAVENOUS | Status: DC | PRN
  Filled 2020-09-10: qty 5

## 2020-09-10 MED ORDER — PHENYLEPHRINE HCL (PRESSORS) 10 MG/ML IV SOLN
INTRAVENOUS | Status: AC
  Filled 2020-09-10: qty 1

## 2020-09-10 MED ORDER — SERTRALINE HCL 50 MG PO TABS
200.0000 mg | ORAL_TABLET | Freq: Every day | ORAL | Status: DC
  Administered 2020-09-10 – 2020-09-13 (×4): 200 mg via ORAL
  Filled 2020-09-10 (×4): qty 4

## 2020-09-10 MED ORDER — MAGNESIUM CITRATE PO SOLN
1.0000 | Freq: Once | ORAL | Status: DC | PRN
  Filled 2020-09-10: qty 296

## 2020-09-10 MED ORDER — PHENYLEPHRINE HCL (PRESSORS) 10 MG/ML IV SOLN
INTRAVENOUS | Status: DC | PRN
  Administered 2020-09-10 (×3): 100 ug via INTRAVENOUS
  Administered 2020-09-10: 200 ug via INTRAVENOUS
  Administered 2020-09-10 (×2): 100 ug via INTRAVENOUS

## 2020-09-10 MED ORDER — PHENYLEPHRINE HCL 10 MG PO TABS
10.0000 mg | ORAL_TABLET | ORAL | Status: DC | PRN
  Filled 2020-09-10: qty 1

## 2020-09-10 MED ORDER — BISACODYL 10 MG RE SUPP: 10.0000 mg | Freq: Every day | RECTAL | Status: DC | PRN

## 2020-09-10 MED ORDER — FLUTICASONE PROPIONATE 50 MCG/ACT NA SUSP
1.0000 | Freq: Every day | NASAL | Status: DC | PRN
  Filled 2020-09-10: qty 16

## 2020-09-10 MED ORDER — MIDAZOLAM HCL 2 MG/2ML IJ SOLN
INTRAMUSCULAR | Status: AC
  Filled 2020-09-10: qty 2

## 2020-09-10 MED ORDER — LIDOCAINE HCL (PF) 2 % IJ SOLN
INTRAMUSCULAR | Status: AC
  Filled 2020-09-10: qty 5

## 2020-09-10 MED ORDER — OXYCODONE HCL 5 MG PO TABS
10.0000 mg | ORAL_TABLET | ORAL | Status: DC | PRN
  Administered 2020-09-10 (×2): 15 mg via ORAL
  Administered 2020-09-11: 10 mg via ORAL
  Administered 2020-09-11 – 2020-09-12 (×5): 15 mg via ORAL
  Filled 2020-09-10 (×7): qty 3

## 2020-09-10 MED ORDER — NEOMYCIN-POLYMYXIN B GU 40-200000 IR SOLN
Status: DC | PRN
  Administered 2020-09-10: 2 mL

## 2020-09-10 MED ORDER — LACTATED RINGERS IV SOLN: INTRAVENOUS | Status: DC

## 2020-09-10 MED ORDER — ONDANSETRON HCL 4 MG/2ML IJ SOLN
4.0000 mg | Freq: Four times a day (QID) | INTRAMUSCULAR | Status: DC | PRN
  Administered 2020-09-10 – 2020-09-11 (×2): 4 mg via INTRAVENOUS
  Filled 2020-09-10 (×2): qty 2

## 2020-09-10 MED ORDER — LIDOCAINE HCL (CARDIAC) PF 100 MG/5ML IV SOSY
PREFILLED_SYRINGE | INTRAVENOUS | Status: DC | PRN
  Administered 2020-09-10: 20 mg via INTRAVENOUS

## 2020-09-10 MED ORDER — VITAMIN D 25 MCG (1000 UNIT) PO TABS
3000.0000 [IU] | ORAL_TABLET | Freq: Every day | ORAL | Status: DC
  Administered 2020-09-10 – 2020-09-14 (×5): 3000 [IU] via ORAL
  Filled 2020-09-10 (×5): qty 3

## 2020-09-10 MED ORDER — ENOXAPARIN SODIUM 30 MG/0.3ML ~~LOC~~ SOLN
30.0000 mg | Freq: Two times a day (BID) | SUBCUTANEOUS | Status: DC
  Administered 2020-09-11 – 2020-09-14 (×7): 30 mg via SUBCUTANEOUS
  Filled 2020-09-10 (×7): qty 0.3

## 2020-09-10 MED ORDER — MIDAZOLAM HCL 5 MG/5ML IJ SOLN
INTRAMUSCULAR | Status: DC | PRN
  Administered 2020-09-10: 1 mg via INTRAVENOUS

## 2020-09-10 MED ORDER — PROPOFOL 500 MG/50ML IV EMUL
INTRAVENOUS | Status: DC | PRN
  Administered 2020-09-10: 75 ug/kg/min via INTRAVENOUS

## 2020-09-10 MED ORDER — MORPHINE SULFATE (PF) 10 MG/ML IV SOLN
INTRAVENOUS | Status: AC
  Filled 2020-09-10: qty 1

## 2020-09-10 MED ORDER — ALPRAZOLAM 0.5 MG PO TABS: 0.5000 mg | ORAL_TABLET | Freq: Two times a day (BID) | ORAL | Status: DC | PRN

## 2020-09-10 MED ORDER — PANTOPRAZOLE SODIUM 40 MG PO TBEC
80.0000 mg | DELAYED_RELEASE_TABLET | Freq: Every day | ORAL | Status: DC
  Administered 2020-09-11 – 2020-09-14 (×4): 80 mg via ORAL
  Filled 2020-09-10 (×4): qty 2

## 2020-09-10 MED ORDER — HYDROMORPHONE HCL 1 MG/ML IJ SOLN
0.5000 mg | INTRAMUSCULAR | Status: DC | PRN
  Administered 2020-09-10 (×2): 1 mg via INTRAVENOUS
  Filled 2020-09-10 (×2): qty 1

## 2020-09-10 MED ORDER — BUPIVACAINE-EPINEPHRINE (PF) 0.25% -1:200000 IJ SOLN
INTRAMUSCULAR | Status: AC
  Filled 2020-09-10: qty 30

## 2020-09-10 MED ORDER — BUPIVACAINE-EPINEPHRINE 0.25% -1:200000 IJ SOLN
INTRAMUSCULAR | Status: DC | PRN
  Administered 2020-09-10: 30 mL

## 2020-09-10 MED ORDER — MORPHINE SULFATE (PF) 10 MG/ML IV SOLN
INTRAVENOUS | Status: DC | PRN
  Administered 2020-09-10: 10 mL via INTRAMUSCULAR

## 2020-09-10 SURGICAL SUPPLY — 69 items
BLADE SAW 90X13X1.19 OSCILLAT (BLADE) ×4 IMPLANT
BLADE SAW 90X25X1.19 OSCILLAT (BLADE) ×2 IMPLANT
BNDG ELASTIC 6X5.8 VLCR STR LF (GAUZE/BANDAGES/DRESSINGS) ×2 IMPLANT
BONE CEMENT GENTAMICIN (Cement) ×2 IMPLANT
BOWL CEMENT MIX W/ADAPTER (MISCELLANEOUS) ×2 IMPLANT
CANISTER SUCT 1200ML W/VALVE (MISCELLANEOUS) ×2 IMPLANT
CANISTER SUCT 3000ML PPV (MISCELLANEOUS) ×4 IMPLANT
CANISTER WOUND CARE 500ML ATS (WOUND CARE) ×4 IMPLANT
CEMENT BONE GENTAMICIN 40 (Cement) ×1 IMPLANT
CHLORAPREP W/TINT 26 (MISCELLANEOUS) ×4 IMPLANT
COOLER POLAR GLACIER W/PUMP (MISCELLANEOUS) ×2 IMPLANT
COVER BACK TABLE REUSABLE LG (DRAPES) ×2 IMPLANT
COVER WAND RF STERILE (DRAPES) ×2 IMPLANT
CUFF TOURN SGL QUICK 24 (TOURNIQUET CUFF)
CUFF TOURN SGL QUICK 30 (TOURNIQUET CUFF)
CUFF TRNQT CYL 24X4X16.5-23 (TOURNIQUET CUFF) IMPLANT
CUFF TRNQT CYL 30X4X21-28X (TOURNIQUET CUFF) IMPLANT
DRAPE 3/4 80X56 (DRAPES) ×8 IMPLANT
DRSG VAC ATS MED SENSATRAC (GAUZE/BANDAGES/DRESSINGS) ×2 IMPLANT
ELECT CAUTERY BLADE 6.4 (BLADE) ×2 IMPLANT
ELECT REM PT RETURN 9FT ADLT (ELECTROSURGICAL) ×2
ELECTRODE REM PT RTRN 9FT ADLT (ELECTROSURGICAL) ×1 IMPLANT
GAUZE SPONGE 4X4 12PLY STRL (GAUZE/BANDAGES/DRESSINGS) ×2 IMPLANT
GAUZE XEROFORM 1X8 LF (GAUZE/BANDAGES/DRESSINGS) ×2 IMPLANT
GLOVE BIOGEL PI IND STRL 9 (GLOVE) ×1 IMPLANT
GLOVE BIOGEL PI INDICATOR 9 (GLOVE) ×1
GLOVE INDICATOR 8.0 STRL GRN (GLOVE) ×2 IMPLANT
GLOVE SURG ORTHO 8.0 STRL STRW (GLOVE) ×2 IMPLANT
GLOVE SURG SYN 9.0  PF PI (GLOVE) ×1
GLOVE SURG SYN 9.0 PF PI (GLOVE) ×1 IMPLANT
GOWN SRG 2XL LVL 4 RGLN SLV (GOWNS) ×1 IMPLANT
GOWN STRL NON-REIN 2XL LVL4 (GOWNS) ×1
GOWN STRL REUS W/ TWL LRG LVL3 (GOWN DISPOSABLE) ×1 IMPLANT
GOWN STRL REUS W/ TWL XL LVL3 (GOWN DISPOSABLE) ×1 IMPLANT
GOWN STRL REUS W/TWL LRG LVL3 (GOWN DISPOSABLE) ×1
GOWN STRL REUS W/TWL XL LVL3 (GOWN DISPOSABLE) ×1
HOLDER FOLEY CATH W/STRAP (MISCELLANEOUS) ×2 IMPLANT
HOOD PEEL AWAY FLYTE STAYCOOL (MISCELLANEOUS) ×4 IMPLANT
INSERT TIB BEARING 5X16 E (Joint) ×2 IMPLANT
KIT PREVENA INCISION MGT20CM45 (CANNISTER) ×2 IMPLANT
KIT TURNOVER KIT A (KITS) ×2 IMPLANT
KNIFE SCULPS 14X20 (INSTRUMENTS) ×2 IMPLANT
MANIFOLD NEPTUNE II (INSTRUMENTS) ×2 IMPLANT
NEEDLE SPNL 18GX3.5 QUINCKE PK (NEEDLE) ×2 IMPLANT
NEEDLE SPNL 20GX3.5 QUINCKE YW (NEEDLE) ×2 IMPLANT
NS IRRIG 1000ML POUR BTL (IV SOLUTION) ×2 IMPLANT
OSC SAW BLADE ×2 IMPLANT
PACK TOTAL KNEE (MISCELLANEOUS) ×2 IMPLANT
PAD WRAPON POLAR KNEE (MISCELLANEOUS) ×2 IMPLANT
PULSAVAC PLUS IRRIG FAN TIP (DISPOSABLE) ×2
SCALPEL PROTECTED #10 DISP (BLADE) ×4 IMPLANT
SOL .9 NS 3000ML IRR  AL (IV SOLUTION) ×1
SOL .9 NS 3000ML IRR UROMATIC (IV SOLUTION) ×1 IMPLANT
STAPLER SKIN PROX 35W (STAPLE) ×2 IMPLANT
SUCTION FRAZIER HANDLE 10FR (MISCELLANEOUS) ×1
SUCTION TUBE FRAZIER 10FR DISP (MISCELLANEOUS) ×1 IMPLANT
SUT DVC 2 QUILL PDO  T11 36X36 (SUTURE) ×1
SUT DVC 2 QUILL PDO T11 36X36 (SUTURE) ×1 IMPLANT
SUT TICRON 2-0 30IN 311381 (SUTURE) IMPLANT
SUT V-LOC 90 ABS DVC 3-0 CL (SUTURE) ×2 IMPLANT
SWAB CULTURE AMIES ANAERIB BLU (MISCELLANEOUS) IMPLANT
SYR 20ML LL LF (SYRINGE) ×2 IMPLANT
SYR 50ML LL SCALE MARK (SYRINGE) ×4 IMPLANT
TIP FAN IRRIG PULSAVAC PLUS (DISPOSABLE) ×1 IMPLANT
TOWEL OR 17X26 4PK STRL BLUE (TOWEL DISPOSABLE) ×2 IMPLANT
TOWER CARTRIDGE SMART MIX (DISPOSABLE) ×2 IMPLANT
TRAY FOLEY MTR SLVR 16FR STAT (SET/KITS/TRAYS/PACK) ×2 IMPLANT
TRIATHLON X3 ASYMMETRIC PATELLA SZ A32 10MM (Knees) ×2 IMPLANT
WRAPON POLAR PAD KNEE (MISCELLANEOUS) ×4

## 2020-09-10 NOTE — Anesthesia Preprocedure Evaluation (Signed)
Anesthesia Evaluation  Patient identified by MRN, date of birth, ID band Patient awake    Reviewed: Allergy & Precautions, NPO status , Patient's Chart, lab work & pertinent test results  History of Anesthesia Complications (+) PROLONGED EMERGENCE and history of anesthetic complications  Airway Mallampati: III       Dental   Pulmonary neg sleep apnea, neg COPD, Not current smoker, former smoker,           Cardiovascular (-) hypertension(-) Past MI and (-) CHF (-) dysrhythmias + Valvular Problems/Murmurs (murmur, no tx)      Neuro/Psych neg Seizures Anxiety Depression    GI/Hepatic Neg liver ROS, GERD  Medicated and Controlled,  Endo/Other  neg diabetesMorbid obesity  Renal/GU Renal disease (stones)     Musculoskeletal   Abdominal   Peds  Hematology   Anesthesia Other Findings   Reproductive/Obstetrics                             Anesthesia Physical Anesthesia Plan  ASA: III  Anesthesia Plan: Spinal   Post-op Pain Management:    Induction:   PONV Risk Score and Plan:   Airway Management Planned:   Additional Equipment:   Intra-op Plan:   Post-operative Plan:   Informed Consent: I have reviewed the patients History and Physical, chart, labs and discussed the procedure including the risks, benefits and alternatives for the proposed anesthesia with the patient or authorized representative who has indicated his/her understanding and acceptance.       Plan Discussed with:   Anesthesia Plan Comments:         Anesthesia Quick Evaluation

## 2020-09-10 NOTE — Op Note (Signed)
09/10/2020  9:31 AM  PATIENT:  Lisa Stein  59 y.o. 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  PROCEDURE: Revision tibial polyethylene component and patella left total knee  SURGEON: Laurene Footman, MD  ASSISTANTS: Rachelle Hora, PA-C  ANESTHESIA:   spinal  EBL:  Total I/O In: 850 [I.V.:800; IV Piggyback:50] Out: 100 [Urine:100]  BLOOD ADMINISTERED:none  DRAINS: Incisional wound VAC   LOCAL MEDICATIONS USED:  MARCAINE    and OTHER Exparel  SPECIMEN:  No Specimen  DISPOSITION OF SPECIMEN:  N/A  COUNTS:  YES  TOURNIQUET:   Total Tourniquet Time Documented: Thigh (Left) - 184378 minutes Total: Thigh (Left) - 035248 minutes   IMPLANTS: Stryker triathlon total knee system 32 patella, cemented, with size five 76mm PS tibial insert  DICTATION: .Dragon Dictation patient was brought to the operating room and after adequate spinal anesthesia was obtained the leg was prepped and draped in usual sterile fashion.  After patient identification timeout procedures were completed tourniquet was raised and prior incision utilized with the proximal portion of the incision that had widened elliptically excised.  Medial parapatellar arthrotomy was performed and there is extensive synovitis present and it clearly impinged in both medial and lateral compartments with a band of tissue in each of these.  The knee had significant opening to varus valgus stress under direct visualization.  After adequate releases to get good exposure of the patella as well the patella was noted to have extensive wear and check it using cautery was found to be somewhat separate from the bone and was loose.  The tibial side was addressed first first removing the scar tissue and mobilized the patellar tendon removing the tibial polyethylene component and  then debriding posteriorly.  A 16 mm trial fit well going up from the 13 and this gave good stability was chosen as the final implant which was then inserted with some difficulty but it stepped into position and the knee was stable in mid flexion and extension.  Next the patella was addressed using a soft initially to remove the patellar button and then drilled to get rid of the plastic pegs.  After getting to fresh bone the 32 mm drill guide was applied and this covered very well 3 drill holes made.  Cement with antibiotic was mixed and used to hold the patella in place when the cement had set and excess cement had been removed the knee was again thoroughly irrigated.  Tourniquet was let down at this point and hemostasis achieved electrocautery.  The arthrotomy was repaired using a heavy Quill along with some #2 Ethibonds in a figure-of-eight fashion to reinforce the repair.  3 oh V-Loc was used subcutaneously followed by skin staples and incisional wound VAC.  Prior to closure the knee was infiltrated with mix of Marcaine and Exparel throughout the knee.  PLAN OF CARE: Admit for overnight observation  PATIENT DISPOSITION:  PACU - hemodynamically stable.

## 2020-09-10 NOTE — H&P (Signed)
History of the Present Illness: Lisa Stein is a 59 y.o. female here today for history and physical for left total knee revision of polyethylene component with possible patella component with Dr. Hessie Knows on 09/10/2020. Surgery was performed back in 2008 by Dr. Mauri Pole. She suffered a fall in December 2009 which suffered a medial condyle fracture and ORIF. She has had persistent pain and instability. Patient has had progressive pain since her fall in 2009, she is tried all kinds of braces and devices to help with stability. Her knee has been buckling and giving way on her. X-rays hip showed no loosening of the components. History and exam findings consistent with instability of her left total knee. Patient has seen Dr. Rudene Christians and discussed polyexchange. After years of putting off surgery she is ready to proceed with left total knee revision with polyethylene exchange and possible patella exchange.  I have reviewed past medical, surgical, social and family history, and allergies as documented in the EMR.  Past Medical History: Past Medical History:  Diagnosis Date  . Depression  . GERD (gastroesophageal reflux disease)  . Lymphedema  . Neuropathy 2015  Trigeminal Neuralgia  . Obesity  . Osteoarthritis  . Raynaud's disease   Past Surgical History: Past Surgical History:  Procedure Laterality Date  . COLON SURGERY 2015  partial colectomy - for intussusception  . COLONOSCOPY 09/23/2010  FH Colon Polyps (Mother): [facility] 09/2015; Recall Ltr mailed 08/17/2015 (dw)  . EGD 09/23/2010  No repeat per RTE  . HYSTERECTOMY VAGINAL  . JOINT REPLACEMENT Left 2009  knee, revision 2011  . ORIF ANKLE FRACTURE Left 2010  . SPINE SURGERY 2017  . TOTAL SHOULDER REPLACEMENT Left 2007, 2009  total of 4 shoulder surgeries  . TRANSFORAMINAL LUMBAR INTERBODY FUSION MINIMALLY INVASIVE N/A 08/02/2016  Procedure: MINIMALLY INVASIVE ARTHRODESIS, COMBINED POSTERIOR/POSTEROLATERAL TECHNIQUE WITH POSTERIOR  INTERBODY TECHNIQUE INCL LAMINECTOMY/DISCECTOMY TO PREPARE INTERSPACE, SINGLE INTERSPACE AND SEGMENT; LUMBAR; Surgeon: Nolon Nations Abd-El-Barr, MD; Location: [facility] OPERATING ROOMS; Service: Neurosurgery; Laterality: N/A;   Past Family History: Family History  Problem Relation Age of Onset  . Uterine cancer Mother  . Kidney disease Mother  . Colon polyps Mother  . Deep vein thrombosis (DVT or abnormal blood clot formation) Mother  . Lung cancer Father  . Cancer Father  Lung metastisized to liver  . Anesthesia problems Neg Hx  . Malignant hyperthermia Neg Hx   Medications: Current Outpatient Medications Ordered in Epic  Medication Sig Dispense Refill  . ALPRAZolam (XANAX) 0.5 MG tablet Take 1 tablet by mouth 2 (two) times daily as needed  . busPIRone (BUSPAR) 10 MG tablet Take 10 mg by mouth 2 (two) times daily  . celecoxib (CELEBREX) 200 MG capsule Take 200 mg by mouth once daily.  . cholecalciferol (VITAMIN D3) 2,000 unit capsule Take 2,000 Units by mouth once daily  . cyclobenzaprine (FLEXERIL) 5 MG tablet TAKE 1 TABLET BY MOUTH 3 TIMES A DAY AS NEEDED FOR MUSCLE SPASMS 0  . diphenhydrAMINE (BENADRYL) 25 mg capsule Take 50 mg by mouth nightly as needed for Allergies or Sleep.   Marland Kitchen estradiol (ESTRACE) 1 MG tablet Take 1 tablet by mouth nightly.   . fluticasone (FLONASE) 50 mcg/actuation nasal spray Place 2 sprays into both nostrils 2 (two) times daily as needed for Rhinitis or Allergies.  Marland Kitchen gabapentin (NEURONTIN) 100 MG capsule Take 2 capsules (200 mg total) by mouth 3 (three) times daily 540 capsule 3  . omeprazole (PRILOSEC) 20 MG DR capsule Take 20 mg by  mouth once daily.   . sertraline (ZOLOFT) 100 MG tablet Take 200 mg by mouth nightly.   Manus Gunning BOWEL PREP KIT oral solution   No current Epic-ordered facility-administered medications on file.   Allergies: Allergies  Allergen Reactions  . Lyrica [Pregabalin] Other (See Comments)  Skin crawling  . Morphine  Hallucination  Sense of doom.  . Adhesive Tape-Silicones Unknown  . Amitriptyline Swelling  . Benzoin Dermatitis  Blisters.  . Benzoin Compound Other (See Comments)  blisters  . Biaxin [Clarithromycin] Nausea  . Carbamazepine Itching  . Hibiclens [Chlorhexidine Gluconate] Rash  . Sulfa (Sulfonamide Antibiotics) Itching  . Sulfasalazine Itching  . Betadine [Povidone-Iodine] Rash    There is no height or weight on file to calculate BMI.  Review of Systems: A comprehensive 14 point ROS was performed, reviewed, and the pertinent orthopaedic findings are documented in the HPI.  There were no vitals filed for this visit.   General Physical Examination:  General:  Well developed, well nourished, no apparent distress, normal affect, normal gait with no antalgic component.   HEENT: Head normocephalic, atraumatic, PERRL.   Abdomen: Soft, non tender, non distended, Bowel sounds present.  Heart: Examination of the heart reveals regular, rate, and rhythm. There is no murmur noted on ascultation. There is a normal apical pulse.  Lungs: Lungs are clear to auscultation. There is no wheeze, rhonchi, or crackles. There is normal expansion of bilateral chest walls.   Musculoskeletal Examination: On examination of the left knee patient has mid flexion instability, 5 to 115 degrees range of motion. No effusion warmth erythema. Incision site completely healed.  Radiographs: AP, lateral, standing, and sunrise x-rays of the left knee were reviewed by me today. These show well-aligned components, Triathlon, Stryker total knee with 2 leg screws on the medial aspect of the knee. No evidence of loosening or subsidence. Implants appear stable.  Assessment: ICD-10-CM  1. History of total knee arthroplasty, left Z96.652  2. Chronic pain of left knee M25.562  G89.29   Plan: 1. Risks, benefits, complications of a left total knee revision of polyethylene component and possible patella component  have been discussed with the patient. Patient has agreed and consented procedure with Dr. Hessie Knows on 09/10/2020.   Electronically signed by Feliberto Gottron, PA at 09/07/2020 8:49 AM EST   Reviewed  H+P. No changes noted.

## 2020-09-10 NOTE — Transfer of Care (Signed)
Immediate Anesthesia Transfer of Care Note  Patient: Lisa Stein  Procedure(s) Performed: Left total knee arthroplasty revision of tibial poly, possible patella Rachelle Hora to Assist (Left Knee)  Patient Location: PACU  Anesthesia Type:General and Spinal  Level of Consciousness: awake, drowsy and patient cooperative  Airway & Oxygen Therapy: Patient Spontanous Breathing  Post-op Assessment: Report given to RN and Post -op Vital signs reviewed and stable  Post vital signs: Reviewed and stable  Last Vitals:  Vitals Value Taken Time  BP 97/45 09/10/20 0932  Temp    Pulse 80 09/10/20 0933  Resp 11 09/10/20 0933  SpO2 95 % 09/10/20 0933  Vitals shown include unvalidated device data.  Last Pain:  Vitals:   09/10/20 0613  TempSrc: Tympanic  PainSc: 0-No pain         Complications: No complications documented.

## 2020-09-10 NOTE — Evaluation (Signed)
Physical Therapy Evaluation Patient Details Name: ROBY SPALLA MRN: [id number] DOB: 08-25-1961 Today's Date: 09/10/2020   History of Present Illness  Pt admitted for revision of L TKR secondary to loose patellar component. HIstory includes depression, GERD, lymphedema, obesity, and chronic neuropathy.  Clinical Impression  Pt is a pleasant 59 year old female who was admitted for L TKR. History of back surgery and prefers to sit/sleep in recliner due to pain. Pt performs bed mobility with min assist, transfers with mod assist, and ambulation with cga and RW. Pt demonstrates deficits with strength/ROM/endurance/pain. Pt demonstrates ability to perform 10 SLRs with independence, therefore does not require KI for mobility. Would benefit from skilled PT to address above deficits and promote optimal return to PLOF. Recommend transition to [place] upon discharge from acute hospitalization.    Follow Up Recommendations Home health PT;Supervision for mobility/OOB    Equipment Recommendations  Rolling walker with 5" wheels    Recommendations for Other Services       Precautions / Restrictions Precautions Precautions: Fall;Knee Precaution Booklet Issued: No Restrictions Weight Bearing Restrictions: Yes LLE Weight Bearing: Weight bearing as tolerated      Mobility  Bed Mobility Overal bed mobility: Needs Assistance Bed Mobility: Supine to Sit     Supine to sit: Min assist     General bed mobility comments: needs cues for sequencing. Once seated at EOB, needs assist to lower down to floor. INcreased back pain    Transfers Overall transfer level: Needs assistance Equipment used: Rolling walker (2 wheeled) Transfers: Sit to/from Stand Sit to Stand: Mod assist         General transfer comment: needs bed elevated prior to performance. Antalgic pattern. Good weight acceptance  Ambulation/Gait Ambulation/Gait assistance: Min guard Gait Distance (Feet): 3 Feet Assistive device:  Rolling walker (2 wheeled) Gait Pattern/deviations: Step-to pattern     General Gait Details: antalgic gait pattern with slow steps over to recliner. Further ambulation limited secondary to pain  Stairs            Wheelchair Mobility    Modified Rankin (Stroke Patients Only)       Balance Overall balance assessment: Needs assistance Sitting-balance support: Bilateral upper extremity supported Sitting balance-Leahy Scale: Good     Standing balance support: Bilateral upper extremity supported Standing balance-Leahy Scale: Good                               Pertinent Vitals/Pain Pain Assessment: 0-10 Pain Score: 7  Pain Location: L knee Pain Descriptors / Indicators: Operative site guarding Pain Intervention(s): Limited activity within patient's tolerance;Ice applied;Repositioned    Home Living Family/patient expects to be discharged to:: Private residence Living Arrangements: Spouse/significant other;Children Available Help at Discharge: Family;Available 24 hours/day Type of Home: House Home Access: Stairs to enter Entrance Stairs-Rails: Left Entrance Stairs-Number of Steps: 5 Home Layout: One level Home Equipment: Cane - single point;Wheelchair - manual;Toilet riser      Prior Function Level of Independence: Independent         Comments: reports no recent falls     Hand Dominance        Extremity/Trunk Assessment   Upper Extremity Assessment Upper Extremity Assessment: Generalized weakness (L UE impaired due to shoulder injuries)    Lower Extremity Assessment Lower Extremity Assessment: Generalized weakness (L LE grossly 3/5; L LE grossly 4+/5)       Communication   Communication: No difficulties  Cognition Arousal/Alertness:  Awake/alert Behavior During Therapy: WFL for tasks assessed/performed Overall Cognitive Status: Within Functional Limits for tasks assessed                                         General Comments      Exercises Total Joint Exercises Goniometric ROM: L knee AAROM: 0-55 degrees Other Exercises Other Exercises: Supine ther-ex performed on L LE including AP, SLRs, and hip abd/add. All ther-ex performed x 10 reps with min/mod assist. Very pain limited   Assessment/Plan    PT Assessment Patient needs continued PT services  PT Problem List Decreased strength;Decreased range of motion;Decreased balance;Decreased mobility;Pain       PT Treatment Interventions DME instruction;Gait training;Therapeutic exercise;Balance training;Stair training    PT Goals (Current goals can be found in the Care Plan section)  Acute Rehab PT Goals Patient Stated Goal: to go home PT Goal Formulation: With patient Time For Goal Achievement: 09/24/20 Potential to Achieve Goals: Good    Frequency BID   Barriers to discharge        Co-evaluation               AM-PAC PT "6 Clicks" Mobility  Outcome Measure Help needed turning from your back to your side while in a flat bed without using bedrails?: A Little Help needed moving from lying on your back to sitting on the side of a flat bed without using bedrails?: A Lot Help needed moving to and from a bed to a chair (including a wheelchair)?: A Lot Help needed standing up from a chair using your arms (e.g., wheelchair or bedside chair)?: A Lot Help needed to walk in hospital room?: A Lot Help needed climbing 3-5 steps with a railing? : A Lot 6 Click Score: 13    End of Session Equipment Utilized During Treatment: Gait belt Activity Tolerance: Patient tolerated treatment well Patient left: in chair;with chair alarm set Nurse Communication: Mobility status PT Visit Diagnosis: Muscle weakness (generalized) (M62.81);Difficulty in walking, not elsewhere classified (R26.2);Pain Pain - Right/Left: Left Pain - part of body: Knee    Time: 1941-7408 PT Time Calculation (min) (ACUTE ONLY): 41 min   Charges:   PT Evaluation $PT  Eval Low Complexity: 1 Low PT Treatments $Therapeutic Exercise: 23-37 mins        Greggory Stallion, PT, DPT [phone]   Kaleeya Hancock 09/10/2020, 3:08 PM

## 2020-09-10 NOTE — Anesthesia Procedure Notes (Signed)
Spinal  Patient location during procedure: OR Start time: 09/10/2020 7:20 AM End time: 09/10/2020 7:27 AM Staffing Anesthesiologist: Gunnar Fusi, MD Resident/CRNA: Lia Foyer, CRNA Preanesthetic Checklist Completed: patient identified, IV checked, site marked, risks and benefits discussed, surgical consent, monitors and equipment checked, pre-op evaluation and timeout performed Spinal Block Patient position: sitting Prep: DuraPrep Patient monitoring: heart rate, cardiac monitor, continuous pulse ox and blood pressure Approach: midline Location: L3-4 Injection technique: single-shot Needle Needle type: Pencan  Needle gauge: 25 G Needle length: 9 cm Assessment Sensory level: T4

## 2020-09-10 NOTE — Plan of Care (Signed)

## 2020-09-11 LAB — CBC
HCT: 40.5 % (ref 36.0–46.0)
Hemoglobin: 12.4 g/dL (ref 12.0–15.0)
MCH: 26.8 pg (ref 26.0–34.0)
MCHC: 30.6 g/dL (ref 30.0–36.0)
MCV: 87.5 fL (ref 80.0–100.0)
Platelets: 224 10*3/uL (ref 150–400)
RBC: 4.63 MIL/uL (ref 3.87–5.11)
RDW: 14.3 % (ref 11.5–15.5)
WBC: 7.2 10*3/uL (ref 4.0–10.5)
nRBC: 0 % (ref 0.0–0.2)

## 2020-09-11 LAB — BASIC METABOLIC PANEL
Anion gap: 8 (ref 5–15)
BUN: 13 mg/dL (ref 8–23)
CO2: 28 mmol/L (ref 22–32)
Calcium: 9.1 mg/dL (ref 8.9–10.3)
Chloride: 103 mmol/L (ref 98–111)
Creatinine, Ser: 0.84 mg/dL (ref 0.44–1.00)
GFR, Estimated: >60 mL/min (ref >60)
Glucose, Bld: 137 mg/dL — ABNORMAL HIGH (ref 70–99)
Potassium: 4.5 mmol/L (ref 3.5–5.1)
Sodium: 139 mmol/L (ref 135–145)

## 2020-09-11 NOTE — TOC Initial Note (Signed)
Transition of Care [facility]) - Initial/Assessment Note    Patient Details  Name: Lisa Stein MRN: [id number] Date of Birth: 02-28-1961  Transition of [facility]) CM/SW Contact:    Shelbie Ammons, RN Phone Number: 09/11/2020, 10:59 AM  Clinical Narrative:    RNCM met with patient in room, patient is sitting up in chair and reports to feeling ok today. Patient reports that she has already been set up with home health and is expecting a visit from [facility] over the weekend. Patient is agreeable to a rolling walker and denies any other needs at this time.  RNCM reached out to [place] with [facility] and they are ready to service patient.  RNCM reached out to [facility] with [employer] and she will provide rolling walker.                Expected Discharge Plan: [place] Barriers to Discharge: No Barriers Identified   Patient Goals and CMS Choice        Expected Discharge Plan and Services Expected Discharge Plan: [place]       Living arrangements for the past 2 months: Single Family Home                 DME Arranged: Walker rolling DME Agency: [employer] Date DME Agency Contacted: 09/11/20 Time DME Agency Contacted: [id number] Representative spoke with at DME Agency: Mardene Celeste            Prior Living Arrangements/Services Living arrangements for the past 2 months: [place] Lives with:: Spouse Patient language and need for interpreter reviewed:: Yes Do you feel safe going back to the place where you live?: Yes      Need for Family Participation in Patient Care: Yes (Comment) Care giver support system in place?: Yes (comment)   Criminal Activity/Legal Involvement Pertinent to Current Situation/Hospitalization: No - Comment as needed  Activities of Daily Living Home Assistive Devices/Equipment: Bedside commode/3-in-1,Shower chair without back ADL Screening (condition at time of admission) Patient's cognitive ability adequate to safely  complete daily activities?: Yes Is the patient deaf or have difficulty hearing?: No Does the patient have difficulty seeing, even when wearing glasses/contacts?: No Does the patient have difficulty concentrating, remembering, or making decisions?: No Patient able to express need for assistance with ADLs?: Yes Does the patient have difficulty dressing or bathing?: Yes Independently performs ADLs?: Yes (appropriate for developmental age) Does the patient have difficulty walking or climbing stairs?: Yes Weakness of Legs: Left Weakness of Arms/Hands: None  Permission Sought/Granted                  Emotional Assessment Appearance:: Appears stated age Attitude/Demeanor/Rapport: Engaged Affect (typically observed): Appropriate Orientation: : Oriented to Self,Oriented to Place,Oriented to  Time,Oriented to Situation Alcohol / Substance Use: Not Applicable Psych Involvement: No (comment)  Admission diagnosis:  S/P revision of total knee [Z96.659] Patient Active Problem List   Diagnosis Date Noted  . S/P revision of total knee 09/10/2020  . Numbness and tingling of foot 10/01/2019  . Primary osteoarthritis of left knee 12/19/2016  . Community acquired pneumonia 11/24/2016  . Sepsis ([place]) 11/17/2016  . Skin nodule 09/05/2016  . Menopause 07/21/2016  . Clinical depression 01/12/2016  . Difficult or painful urination 03/16/2015  . Fatigue 03/16/2015  . Anxiety, generalized 03/16/2015  . Acid reflux 03/16/2015  . Cardiac murmur 03/16/2015  . H/O major abdominal surgery 03/16/2015  . Calculus of kidney 03/16/2015  . Acquired lymphedema  03/16/2015  . Affective disorder, major 03/16/2015  . Arthritis, degenerative 03/16/2015  . Adiposity 03/16/2015  . Raynaud's syndrome 03/16/2015  . Muscle rigidity 03/16/2015  . Fothergill's neuralgia 03/16/2015  . Avitaminosis D 03/16/2015  . DDD (degenerative disc disease), lumbar 11/20/2014  . Neuritis or radiculitis due to rupture of lumbar  intervertebral disc 11/20/2014  . Lower abdominal pain 08/05/2014  . Diarrhea 08/05/2014   PCP:  Jerrol Banana., MD Pharmacy:   [id number] [place] [address] [id number]: [phone]: [phone]    Social Determinants of Health (SDOH) Interventions    Readmission Risk Interventions No flowsheet data found.

## 2020-09-11 NOTE — Evaluation (Signed)
Occupational Therapy Evaluation Patient Details Name: Lisa Stein MRN: [id number] DOB: 1961/01/18 Today's Date: 09/11/2020    History of Present Illness Pt admitted for revision of L TKR secondary to loose patellar component. HIstory includes depression, GERD, lymphedema, obesity, and chronic neuropathy.   Clinical Impression   Patient presenting with decreased I in self care, balance, functional mobility/transfers, endurance, and safety awareness. Patient reports living at home with husband independently PTA. Pt will have 24/7 assistance as needed at discharge. Patient currently functioning at min A overall with functional transfers and self care ( LB). Pt is experiencing increased pain with mobility reporting 10/10 pain in L knee with transfer back to bed. Pt becoming nauseated with mobility as well. OT reviewed polar care but pt is familiar with this as well. Paper handout provided.  Patient will benefit from acute OT to increase overall independence in the areas of ADLs, functional mobility, and safety awareness in order to safely discharge home with family.    Follow Up Recommendations  Home health OT;Supervision/Assistance - 24 hour    Equipment Recommendations  3 in 1 bedside commode       Precautions / Restrictions Precautions Precautions: Fall;Knee Precaution Booklet Issued: No Restrictions Weight Bearing Restrictions: Yes LLE Weight Bearing: Weight bearing as tolerated      Mobility Bed Mobility Overal bed mobility: Needs Assistance Bed Mobility: Supine to Sit     Supine to sit: Min assist;HOB elevated     General bed mobility comments: minA for LLE assist    Transfers Overall transfer level: Needs assistance Equipment used: Rolling walker (2 wheeled) Transfers: Sit to/from Stand Sit to Stand: Min assist;From elevated surface         General transfer comment: needs bed elevated prior to performance. Antalgic pattern. poor weight acceptance    Balance  Overall balance assessment: Needs assistance Sitting-balance support: Bilateral upper extremity supported Sitting balance-Leahy Scale: Good     Standing balance support: Bilateral upper extremity supported Standing balance-Leahy Scale: Poor Standing balance comment: reliant on UE support                           ADL either performed or assessed with clinical judgement   ADL Overall ADL's : Needs assistance/impaired Eating/Feeding: Independent   Grooming: Wash/dry hands;Wash/dry face;Oral care;Sitting;Set up               Lower Body Dressing: Minimal assistance;Sit to/from stand   Toilet Transfer: Min guard;RW;BSC                   Vision Baseline Vision/History: Wears glasses Wears Glasses: At all times Patient Visual Report: No change from baseline              Pertinent Vitals/Pain Pain Assessment: Faces Pain Score: 10-Worst pain ever Pain Location: L knee Pain Descriptors / Indicators: Operative site guarding;Moaning;Grimacing;Tender Pain Intervention(s): Limited activity within patient's tolerance;Premedicated before session;Repositioned     Hand Dominance Right   Extremity/Trunk Assessment Upper Extremity Assessment Upper Extremity Assessment: Generalized weakness   Lower Extremity Assessment Lower Extremity Assessment: Generalized weakness       Communication Communication Communication: No difficulties   Cognition Arousal/Alertness: Awake/alert Behavior During Therapy: WFL for tasks assessed/performed Overall Cognitive Status: Within Functional Limits for tasks assessed  Exercises Total Joint Exercises Ankle Circles/Pumps: AROM;Both;10 reps Quad Sets: AROM;Both;10 reps Heel Slides: AAROM;Left;10 reps        Home Living Family/patient expects to be discharged to:: Private residence Living Arrangements: Spouse/significant other;Children Available Help at Discharge:  Family;Available 24 hours/day Type of Home: House Home Access: Stairs to enter [employer] of Steps: 5 Entrance Stairs-Rails: Left Home Layout: One level     Bathroom Shower/Tub: Tub/shower unit         Home Equipment: Cane - single point;Wheelchair - manual;Toilet riser          Prior Functioning/Environment Level of Independence: Independent        Comments: reports no recent falls        OT Problem List: Decreased strength;Decreased activity tolerance;Impaired balance (sitting and/or standing);Decreased safety awareness;Pain;Decreased knowledge of use of DME or AE      OT Treatment/Interventions: Self-care/ADL training;Therapeutic exercise;Therapeutic activities;Energy conservation;Visual/perceptual remediation/compensation;Patient/family education;DME and/or AE instruction;Balance training    OT Goals(Current goals can be found in the care plan section) Acute Rehab OT Goals Patient Stated Goal: to go home OT Goal Formulation: With patient Time For Goal Achievement: 09/25/20 Potential to Achieve Goals: Good ADL Goals Pt Will Perform Grooming: with modified independence;standing Pt Will Transfer to Toilet: with modified independence;ambulating Pt Will Perform Toileting - Clothing Manipulation and hygiene: with modified independence;sit to/from stand Pt Will Perform Tub/Shower Transfer: with modified independence;ambulating  OT Frequency: Min 2X/week   Barriers to D/C:    none at this time          AM-PAC OT "6 Clicks" Daily Activity     Outcome Measure Help from another person eating meals?: None Help from another person taking care of personal grooming?: None Help from another person toileting, which includes using toliet, bedpan, or urinal?: A Little Help from another person bathing (including washing, rinsing, drying)?: A Little Help from another person to put on and taking off regular upper body clothing?: None Help from another person to put  on and taking off regular lower body clothing?: A Little 6 Click Score: 21   End of Session Equipment Utilized During Treatment: Rolling walker Nurse Communication: Mobility status;Precautions;Patient requests pain meds  Activity Tolerance: Patient tolerated treatment well Patient left: in bed;with call bell/phone within reach;with bed alarm set  OT Visit Diagnosis: Unsteadiness on feet (R26.81);Muscle weakness (generalized) (M62.81);Pain Pain - Right/Left: Left Pain - part of body: Knee                Time: 8546-2703 OT Time Calculation (min): 20 min Charges:  OT General Charges $OT Visit: 1 Visit OT Evaluation $OT Eval Low Complexity: 1 Low OT Treatments $Self Care/Home Management : 8-22 mins  Darleen Crocker, MS, OTR/L , CBIS ascom [phone]  09/11/20, 2:00 PM

## 2020-09-11 NOTE — Plan of Care (Signed)
See plan documentation.

## 2020-09-11 NOTE — Progress Notes (Signed)
Physical Therapy Treatment Patient Details Name: Lisa Stein MRN: [id number] DOB: 1960-11-14 Today's Date: 09/11/2020    History of Present Illness Pt admitted for revision of L TKR secondary to loose patellar component. HIstory includes depression, GERD, lymphedema, obesity, and chronic neuropathy.    PT Comments    Pt alert, agreeable to PT but did report 7-8/10 pain in L knee. Pt mobility limited due to elevated pain levels and more physical assist provided because of this as well. Several exercises performed, and supine to sit with minA for LLE assist. Good sitting balance noted, but significant lack of L knee flexion noted. Sit <> stand from elevated bed with minA (light physical assist but more for steadying/RW steadying). She was able to take a few steps to recliner in the room, further mobility deferred due to increased pain and nausea. She exhibited significant antalgic gait attempts and poor foot clearance bilaterally. Pt in chair with all needs in reach. The patient would benefit from further skilled PT intervention to continue to progress towards goals. Recommendation remains appropriate pending pt progress with mobility.     Follow Up Recommendations  Home health PT;Supervision for mobility/OOB     Equipment Recommendations  Rolling walker with 5" wheels    Recommendations for Other Services       Precautions / Restrictions Precautions Precautions: Fall;Knee Precaution Booklet Issued: No Restrictions Weight Bearing Restrictions: Yes LLE Weight Bearing: Weight bearing as tolerated    Mobility  Bed Mobility Overal bed mobility: Needs Assistance Bed Mobility: Supine to Sit     Supine to sit: Min assist;HOB elevated     General bed mobility comments: minA for LLE assist  Transfers Overall transfer level: Needs assistance Equipment used: Rolling walker (2 wheeled) Transfers: Sit to/from Stand Sit to Stand: Min assist;From elevated surface          General transfer comment: needs bed elevated prior to performance. Antalgic pattern. poor weight acceptance  Ambulation/Gait Ambulation/Gait assistance: Min guard Gait Distance (Feet): 3 Feet Assistive device: Rolling walker (2 wheeled) Gait Pattern/deviations: Step-to pattern     General Gait Details: very antalgic, poor foot clearance bilaterally   Stairs             Wheelchair Mobility    Modified Rankin (Stroke Patients Only)       Balance Overall balance assessment: Needs assistance Sitting-balance support: Bilateral upper extremity supported Sitting balance-Leahy Scale: Good     Standing balance support: Bilateral upper extremity supported Standing balance-Leahy Scale: Poor Standing balance comment: reliant on UE support                            Cognition Arousal/Alertness: Awake/alert Behavior During Therapy: WFL for tasks assessed/performed Overall Cognitive Status: Within Functional Limits for tasks assessed                                        Exercises Total Joint Exercises Ankle Circles/Pumps: AROM;Both;10 reps Quad Sets: AROM;Both;10 reps Heel Slides: AAROM;Left;10 reps    General Comments        Pertinent Vitals/Pain Pain Assessment: 0-10 Pain Score: 7  Pain Location: L knee Pain Descriptors / Indicators: Operative site guarding;Moaning;Grimacing;Tender Pain Intervention(s): Limited activity within patient's tolerance;Monitored during session;Repositioned;Premedicated before session;Ice applied    Home Living  Prior Function            PT Goals (current goals can now be found in the care plan section) Progress towards PT goals: Progressing toward goals    Frequency    BID      PT Plan Current plan remains appropriate    Co-evaluation              AM-PAC PT "6 Clicks" Mobility   Outcome Measure  Help needed turning from your back to your side while in  a flat bed without using bedrails?: A Little Help needed moving from lying on your back to sitting on the side of a flat bed without using bedrails?: A Little Help needed moving to and from a bed to a chair (including a wheelchair)?: A Little Help needed standing up from a chair using your arms (e.g., wheelchair or bedside chair)?: A Lot Help needed to walk in hospital room?: A Lot Help needed climbing 3-5 steps with a railing? : Total 6 Click Score: 14    End of Session Equipment Utilized During Treatment: Gait belt Activity Tolerance: Patient limited by pain;Other (comment) (limited by nausea) Patient left: in chair;with chair alarm set;with call bell/phone within reach Nurse Communication: Mobility status PT Visit Diagnosis: Muscle weakness (generalized) (M62.81);Difficulty in walking, not elsewhere classified (R26.2);Pain Pain - Right/Left: Left Pain - part of body: Knee     Time: 3154-0086 PT Time Calculation (min) (ACUTE ONLY): 30 min  Charges:  $Therapeutic Exercise: 23-37 mins                    Lieutenant Diego PT, DPT 11:32 AM,09/11/20

## 2020-09-11 NOTE — Progress Notes (Signed)
Physical Therapy Treatment Patient Details Name: Lisa Stein MRN: [id number] DOB: 1961-05-24 Today's Date: 09/11/2020    History of Present Illness Pt admitted for revision of L TKR secondary to loose patellar component. HIstory includes depression, GERD, lymphedema, obesity, and chronic neuropathy.    PT Comments    Pt declined bed mobility/OOB this PM due to elevated pain levels. Session focused on supine LLE exercises. Pt needed physical assist for most exercises due to pain and poor quad activation, though muscle activation did improve over time with repetitions. Pt repositioned in bed with verbal cueing, left with all needs in reach. The patient would benefit from further skilled PT intervention to continue to progress towards goals. Recommendation remains appropriate pending further progress with mobility.     Follow Up Recommendations  Home health PT;Supervision for mobility/OOB     Equipment Recommendations  Rolling walker with 5" wheels    Recommendations for Other Services       Precautions / Restrictions Precautions Precautions: Fall;Knee Precaution Booklet Issued: No Restrictions Weight Bearing Restrictions: Yes LLE Weight Bearing: Weight bearing as tolerated    Mobility  Bed Mobility Overal bed mobility: Needs Assistance Bed Mobility: Supine to Sit     Supine to sit: Min assist;HOB elevated     General bed mobility comments: deferred due to pt request because of elevated pain  Transfers Overall transfer level: Needs assistance Equipment used: Rolling walker (2 wheeled) Transfers: Sit to/from Stand Sit to Stand: Min assist;From elevated surface         General transfer comment: needs bed elevated prior to performance. Antalgic pattern. poor weight acceptance  Ambulation/Gait Ambulation/Gait assistance: Min guard Gait Distance (Feet): 3 Feet Assistive device: Rolling walker (2 wheeled) Gait Pattern/deviations: Step-to pattern     General  Gait Details: very antalgic, poor foot clearance bilaterally   Stairs             Wheelchair Mobility    Modified Rankin (Stroke Patients Only)       Balance Overall balance assessment: Needs assistance Sitting-balance support: Bilateral upper extremity supported Sitting balance-Leahy Scale: Good     Standing balance support: Bilateral upper extremity supported Standing balance-Leahy Scale: Poor Standing balance comment: reliant on UE support                            Cognition Arousal/Alertness: Awake/alert Behavior During Therapy: WFL for tasks assessed/performed Overall Cognitive Status: Within Functional Limits for tasks assessed                                        Exercises Total Joint Exercises Ankle Circles/Pumps: AROM;Both;15 reps Quad Sets: AROM;Both;15 reps Short Arc Quad: AAROM;Strengthening;Left;15 reps Heel Slides: AAROM;Left;15 reps;Strengthening Hip ABduction/ADduction: AAROM;Strengthening;Left;15 reps Straight Leg Raises: AAROM;Strengthening;Left;15 reps Goniometric ROM: L knee 0 - 40    General Comments        Pertinent Vitals/Pain Pain Assessment: Faces Pain Score: 8  Pain Location: L knee Pain Descriptors / Indicators: Operative site guarding;Moaning;Grimacing;Tender Pain Intervention(s): Limited activity within patient's tolerance;Monitored during session;Premedicated before session;Repositioned    Home Living Family/patient expects to be discharged to:: Private residence Living Arrangements: Spouse/significant other;Children Available Help at Discharge: Family;Available 24 hours/day Type of Home: House Home Access: Stairs to enter Entrance Stairs-Rails: Left Home Layout: One level Home Equipment: Cane - single point;Wheelchair - manual;Toilet riser  Prior Function Level of Independence: Independent      Comments: reports no recent falls   PT Goals (current goals can now be found in the  care plan section) Acute Rehab PT Goals Patient Stated Goal: to go home Progress towards PT goals: Progressing toward goals    Frequency    BID      PT Plan Current plan remains appropriate    Co-evaluation              AM-PAC PT "6 Clicks" Mobility   Outcome Measure  Help needed turning from your back to your side while in a flat bed without using bedrails?: A Little Help needed moving from lying on your back to sitting on the side of a flat bed without using bedrails?: A Little Help needed moving to and from a bed to a chair (including a wheelchair)?: A Little Help needed standing up from a chair using your arms (e.g., wheelchair or bedside chair)?: A Lot Help needed to walk in hospital room?: A Lot Help needed climbing 3-5 steps with a railing? : Total 6 Click Score: 14    End of Session Equipment Utilized During Treatment: Gait belt Activity Tolerance: Patient limited by pain Patient left: in bed;with call bell/phone within reach;with bed alarm set;with SCD's reapplied Nurse Communication: Mobility status PT Visit Diagnosis: Muscle weakness (generalized) (M62.81);Difficulty in walking, not elsewhere classified (R26.2);Pain Pain - Right/Left: Left Pain - part of body: Knee     Time: 8127-5170 PT Time Calculation (min) (ACUTE ONLY): 19 min  Charges:  $Therapeutic Exercise: 8-22 mins                     Lieutenant Diego PT, DPT 2:05 PM,09/11/20

## 2020-09-11 NOTE — Progress Notes (Signed)
   Subjective: 1 Day Post-Op Procedure(s) (LRB): Left total knee arthroplasty revision of tibial poly, possible patella Rachelle Hora to Assist (Left) Patient reports pain as severe.   Patient is well, and has had no acute complaints or problems Denies any CP, SOB, ABD pain. We will continue therapy today.  Plan is to go Home after hospital stay.  Objective: Vital signs in last 24 hours: Temp:  [97.1 F (36.2 C)-99.2 F (37.3 C)] 99.2 F (37.3 C) (12/10 0758) Pulse Rate:  [65-100] 96 (12/10 0758) Resp:  [10-22] 16 (12/10 0758) BP: (97-147)/(45-85) 136/76 (12/10 0758) SpO2:  [88 %-100 %] 96 % (12/10 0758)  Intake/Output from previous day: 12/09 0701 - 12/10 0700 In: 1745.6 [I.V.:1695.6; IV Piggyback:50] Out: 8003 [Urine:1700; Blood:50] Intake/Output this shift: No intake/output data recorded.  Recent Labs    09/10/20 1217  HGB 12.4   Recent Labs    09/10/20 1217  WBC 7.5  RBC 4.70  HCT 40.8  PLT 225   Recent Labs    09/10/20 1217  CREATININE 0.90   No results for input(s): LABPT, INR in the last 72 hours.  EXAM General - Patient is Alert, Appropriate and Oriented Extremity - Neurovascular intact Sensation intact distally Intact pulses distally Dorsiflexion/Plantar flexion intact No cellulitis present Compartment soft Dressing - dressing C/D/I and no drainage, prevena intact with out drainage Motor Function - intact, moving foot and toes well on exam.   Past Medical History:  Diagnosis Date  . Anxiety   . Complication of anesthesia    difficulty waking up after back surgery. o2 sats dropped low.  . Depression   . GERD (gastroesophageal reflux disease)   . Heart murmur    no treatment  . History of kidney stones   . Lymphedema    left ankle. sometimes uses compression socks  . Neuralgia   . Osteoarthritis   . Pneumonia   . Raynaud disease     Assessment/Plan:   1 Day Post-Op Procedure(s) (LRB): Left total knee arthroplasty revision of tibial  poly, possible patella Rachelle Hora to Assist (Left) Active Problems:   S/P revision of total knee  Estimated body mass index is 37.28 kg/m as calculated from the following:   Height as of 08/04/20: 6\' 1"  (1.854 m).   Weight as of 09/02/20: 128.2 kg. Advance diet Up with therapy  Continue with current regimen VSS CM to assist with discharge to home with HHPT today or tomorrow  DVT Prophylaxis - Lovenox, Foot Pumps and TED hose Weight-Bearing as tolerated to left leg   T. Rachelle Hora, PA-C [place] 09/11/2020, 8:03 AM

## 2020-09-11 NOTE — Anesthesia Postprocedure Evaluation (Signed)
Anesthesia Post Note  Patient: ICEL CASTLES  Procedure(s) Performed: Left total knee arthroplasty revision of tibial poly, possible patella Rachelle Hora to Assist (Left Knee)  Patient location during evaluation: Nursing Unit Anesthesia Type: Spinal Level of consciousness: oriented and awake and alert Pain management: pain level controlled Vital Signs Assessment: post-procedure vital signs reviewed and stable Respiratory status: spontaneous breathing and respiratory function stable Cardiovascular status: blood pressure returned to baseline and stable Postop Assessment: no headache, no backache, no apparent nausea or vomiting and patient able to bend at knees Anesthetic complications: no   No complications documented.   Last Vitals:  Vitals:   09/11/20 0506 09/11/20 0758  BP: (!) 125/49 136/76  Pulse: 94 96  Resp: 16 16  Temp: 36.8 C 37.3 C  SpO2: 93% 96%    Last Pain:  Vitals:   09/11/20 0727  TempSrc:   PainSc: Asleep                 Brantley Fling

## 2020-09-12 MED ORDER — MAGNESIUM HYDROXIDE 400 MG/5ML PO SUSP
30.0000 mL | Freq: Once | ORAL | Status: AC
  Administered 2020-09-12: 30 mL via ORAL
  Filled 2020-09-12: qty 30

## 2020-09-12 MED ORDER — GABAPENTIN 300 MG PO CAPS
300.0000 mg | ORAL_CAPSULE | Freq: Three times a day (TID) | ORAL | Status: DC
  Administered 2020-09-12 – 2020-09-14 (×7): 300 mg via ORAL
  Filled 2020-09-12 (×7): qty 1

## 2020-09-12 NOTE — Progress Notes (Signed)
Physical Therapy Treatment Patient Details Name: Lisa Stein MRN: [id number] DOB: [birth year] Today's Date: 09/12/2020    History of Present Illness Pt admitted for revision of L TKR secondary to loose patellar component. HIstory includes depression, GERD, lymphedema, obesity, and chronic neuropathy.    PT Comments    Pt was long sitting in bed, pre-medicated, and agreeable to PT session. Continues to endorse severe pain especially with movements. Lengthy discussion about DC disposition and concerns with lack of progress with therapy. She does not want to DC to rehab but is now starting to consider. Will closely monitor throughout the day today and into tomorrow.She required mod assist to exit L side of bed.Sat EOb and perform light AROM exercises to improve knee flexion. AROM L knee flex to ~ 70 degrees. Very limited by pain. Stood from extremely elevated bed height to RW with min assist and took ~ 5 steps to recliner. Min assist to stand to sit with eccentric controll. Pt unwilling to advance gait distances at this time due to pain. She was repositioned in recliner with chair alarm in place, polar care and ice pack applied and call bell placed within reach. Will return in PM to attempt to advance gait and safe functional mobility. DC rec adjusted to SNF however pt may not be willing. Pt does report having a ramp entry to home and a w/c and lift chair. Needs 3 in 1 for home use. Does have personal RW in room already.    Follow Up Recommendations  SNF;Supervision for mobility/OOB;Supervision/Assistance - 24 hour (DC recs changed to SNF however pt may be unwilling. Will continue to closely monitor progress. will return for PM session.)     Equipment Recommendations  3in1 (PT) (pt has RW but needs 3 in 1)    Recommendations for Other Services       Precautions / Restrictions Precautions Precautions: Fall;Knee Precaution Booklet Issued: Yes (comment) (HEP issued) Restrictions Weight  Bearing Restrictions: No LLE Weight Bearing: Weight bearing as tolerated    Mobility  Bed Mobility Overal bed mobility: Needs Assistance Bed Mobility: Supine to Sit     Supine to sit: Mod assist;HOB elevated     General bed mobility comments: Pt require mod assist to exit L side of bed with increased time to perform. sevrely limited by pain throughout session.  Transfers Overall transfer level: Needs assistance Equipment used: Rolling walker (2 wheeled) Transfers: Sit to/from Stand Sit to Stand: Min assist;From elevated surface         General transfer comment: Bed height extremely elevated to STS with min assist + vcs. pt unwilling to trail multiple standing attempts.  Ambulation/Gait Ambulation/Gait assistance: Min guard Gait Distance (Feet): 3 Feet Assistive device: Rolling walker (2 wheeled) Gait Pattern/deviations: Step-to pattern;Antalgic;Trunk flexed Gait velocity: decreased   General Gait Details: Pt limited to standing and taking several steps to recliner. unwilling to progress gait distances 2/2 to pain.      Balance Overall balance assessment: Needs assistance Sitting-balance support: Bilateral upper extremity supported Sitting balance-Leahy Scale: Good Sitting balance - Comments: no LOB in sitting   Standing balance support: Bilateral upper extremity supported Standing balance-Leahy Scale: Fair Standing balance comment: reliant on UE support         Cognition Arousal/Alertness: Awake/alert Behavior During Therapy: WFL for tasks assessed/performed Overall Cognitive Status: Within Functional Limits for tasks assessed      General Comments: Pt is A and O x 4      Exercises Total Joint Exercises  Knee Flexion: AROM;Seated (performed AROM in sitting with towel under foot to reduce friction. sevrely limited by pain. poor ROM noted) Goniometric ROM: ~ 70 degrees flexion after exerxcises performed        Pertinent Vitals/Pain Pain Assessment:  0-10 Pain Score: 8  Pain Location: L knee Pain Descriptors / Indicators: Operative site guarding;Moaning;Grimacing;Tender Pain Intervention(s): Limited activity within patient's tolerance;Monitored during session;Premedicated before session;Repositioned;Ice applied (ice applied to knee and thigh)           PT Goals (current goals can now be found in the care plan section) Acute Rehab PT Goals Patient Stated Goal: to go home Progress towards PT goals: Not progressing toward goals - comment (slow progress 2/2 to pain)    Frequency    BID      PT Plan Discharge plan needs to be updated       AM-PAC PT "6 Clicks" Mobility   Outcome Measure  Help needed turning from your back to your side while in a flat bed without using bedrails?: A Little Help needed moving from lying on your back to sitting on the side of a flat bed without using bedrails?: A Lot Help needed moving to and from a bed to a chair (including a wheelchair)?: A Lot Help needed standing up from a chair using your arms (e.g., wheelchair or bedside chair)?: A Lot Help needed to walk in hospital room?: A Lot Help needed climbing 3-5 steps with a railing? : Total 6 Click Score: 12    End of Session   Activity Tolerance: Patient limited by pain Patient left: in chair;with call bell/phone within reach;with chair alarm set Nurse Communication: Mobility status PT Visit Diagnosis: Muscle weakness (generalized) (M62.81);Difficulty in walking, not elsewhere classified (R26.2);Pain Pain - Right/Left: Left Pain - part of body: Knee     Time: 0092-3300 PT Time Calculation (min) (ACUTE ONLY): 24 min  Charges:  $Therapeutic Exercise: 8-22 mins $Therapeutic Activity: 8-22 mins                     Julaine Fusi PTA 09/12/20, 10:15 AM

## 2020-09-12 NOTE — TOC Progression Note (Signed)
Transition of Care [facility]) - Progression Note    Patient Details  Name: MADELIENE TEJERA MRN: [id number] Date of Birth: May 30, 1961  [facility] of [facility]) CM/SW Contact  Izola Price, RN Phone Number: 09/12/2020, 12:17 PM  Clinical Narrative:    12/11 Final discharge planning pending PT progress. Provider indicated HOME though PT rec is SNF due to slow PT progress, but stated per notes that patient may be unwilling. Has [place] set up via [facility] via Helene Kelp  and [place] via Mardene Celeste for DME via prior RN CM notes of 09/11/20 if [facility] is final discharge destination. Will continue to monitor. Simmie Davies RN CM    Expected Discharge Plan: [place] Barriers to Discharge: No Barriers Identified  Expected Discharge Plan and Services Expected Discharge Plan: [place] arrangements for the past 2 months: Single Family Home                 DME Arranged: Walker rolling DME Agency: [employer] Date DME Agency Contacted: 09/11/20 Time DME Agency Contacted: [phone] Representative spoke with at DME Agency: [place] ([place]) Interventions    Readmission Risk Interventions No flowsheet data found.

## 2020-09-12 NOTE — Progress Notes (Signed)
Physical Therapy Treatment Patient Details Name: Lisa Stein MRN: [id number] DOB: [birth year] Today's Date: 09/12/2020    History of Present Illness Pt admitted for revision of L TKR secondary to loose patellar component. HIstory includes depression, GERD, lymphedema, obesity, and chronic neuropathy.    PT Comments    Pt was long sitting in bed upon arriving. She agrees to PT session and is motivated to improve. Continues to endorse pain however impacted session less this afternoon. She required mod assist to exit and re-enter bed with assistance progressing LLE. Stood from elevated bed height with min assist. Ambulated 50 ft with RW with slow step to pattern. Overall pt is progressing towards PT goals but currently still recommending rehab. Pt may not be willing but was receptive to idea. Did not make a final decision. Recommend SNF to address deficits while improving independence. Acute PT will continue to follow and progress as able per POC.       Follow Up Recommendations  SNF;Supervision for mobility/OOB;Supervision/Assistance - 24 hour     Equipment Recommendations  3in1 (PT)    Recommendations for Other Services       Precautions / Restrictions Precautions Precautions: Fall;Knee Precaution Booklet Issued: Yes (comment) Restrictions Weight Bearing Restrictions: No LLE Weight Bearing: Weight bearing as tolerated    Mobility  Bed Mobility Overal bed mobility: Needs Assistance Bed Mobility: Supine to Sit     Supine to sit: Mod assist;HOB elevated     General bed mobility comments: supported LLE with  RUE HHA to EOB  Transfers Overall transfer level: Needs assistance Equipment used: Rolling walker (2 wheeled) Transfers: Sit to/from Stand Sit to Stand: From elevated surface;Min guard         General transfer comment: pt did demonstrate improved safety and abilities with STS. Stood from elevated bed height prior to standing from  [facility].  Ambulation/Gait Ambulation/Gait assistance: Min guard Gait Distance (Feet): 50 Feet Assistive device: Rolling walker (2 wheeled) Gait Pattern/deviations: Step-to pattern;Antalgic;Trunk flexed Gait velocity: decreased   General Gait Details: Pt was able to ambulate ~ 50 ft with slow antalgic step to gait pattern.       Balance Overall balance assessment: Needs assistance Sitting-balance support: Feet supported Sitting balance-Leahy Scale: Good Sitting balance - Comments: no LOB in sitting   Standing balance support: Bilateral upper extremity supported Standing balance-Leahy Scale: Good Standing balance comment: reliant on UE support       Cognition Arousal/Alertness: Awake/alert Behavior During Therapy: WFL for tasks assessed/performed Overall Cognitive Status: Within Functional Limits for tasks assessed      General Comments: Pt is A and O x 4             Pertinent Vitals/Pain Pain Assessment: 0-10 Pain Score: 7  Pain Location: L knee Pain Descriptors / Indicators: Operative site guarding;Moaning;Grimacing;Tender Pain Intervention(s): Limited activity within patient's tolerance;Premedicated before session;Monitored during session;Repositioned           PT Goals (current goals can now be found in the care plan section) Acute Rehab PT Goals Patient Stated Goal: to go home Progress towards PT goals: Progressing toward goals    Frequency    BID      PT Plan Current plan remains appropriate       AM-PAC PT "6 Clicks" Mobility   Outcome Measure  Help needed turning from your back to your side while in a flat bed without using bedrails?: A Little Help needed moving from lying on your back to sitting on the side  of a flat bed without using bedrails?: A Lot Help needed moving to and from a bed to a chair (including a wheelchair)?: A Lot Help needed standing up from a chair using your arms (e.g., wheelchair or bedside chair)?: A Lot Help needed to  walk in hospital room?: A Lot Help needed climbing 3-5 steps with a railing? : Total 6 Click Score: 12    End of Session   Activity Tolerance: Patient tolerated treatment well;Patient limited by pain Patient left: with call bell/phone within reach;with bed alarm set;in bed Nurse Communication: Mobility status PT Visit Diagnosis: Muscle weakness (generalized) (M62.81);Difficulty in walking, not elsewhere classified (R26.2);Pain Pain - Right/Left: Left Pain - part of body: Knee     Time: 1540-1606 PT Time Calculation (min) (ACUTE ONLY): 26 min  Charges:  $Gait Training: 8-22 mins $Therapeutic Activity: 8-22 mins                     Julaine Fusi PTA 09/12/20, 4:33 PM

## 2020-09-12 NOTE — Progress Notes (Signed)
   Subjective: 2 Days Post-Op Procedure(s) (LRB): Left total knee arthroplasty revision of tibial poly, possible patella Rachelle Hora to Assist (Left) Patient reports pain as moderate.  Pain improved last night compared to previous night.  She also reports flareup of her peripheral neuropathy in both legs, is currently taking 200 mg of gabapentin 3 times daily for this. Patient is well, and has had no acute complaints or problems Denies any CP, SOB, ABD pain. We will continue therapy today.  Plan is to go Home after hospital stay.  Slow progress of physical therapy yesterday.  Objective: Vital signs in last 24 hours: Temp:  [98.2 F (36.8 C)-99.2 F (37.3 C)] 98.7 F (37.1 C) (12/11 0826) Pulse Rate:  [87-104] 93 (12/11 0826) Resp:  [15-17] 15 (12/11 0826) BP: (122-147)/(51-73) 131/59 (12/11 0826) SpO2:  [89 %-96 %] 93 % (12/11 0826)  Intake/Output from previous day: 12/10 0701 - 12/11 0700 In: 440 [P.O.:440] Out: -  Intake/Output this shift: No intake/output data recorded.  Recent Labs    09/10/20 1217 09/11/20 0746  HGB 12.4 12.4   Recent Labs    09/10/20 1217 09/11/20 0746  WBC 7.5 7.2  RBC 4.70 4.63  HCT 40.8 40.5  PLT 225 224   Recent Labs    09/10/20 1217 09/11/20 0746  NA  --  139  K  --  4.5  CL  --  103  CO2  --  28  BUN  --  13  CREATININE 0.90 0.84  GLUCOSE  --  137*  CALCIUM  --  9.1   No results for input(s): LABPT, INR in the last 72 hours.  EXAM General - Patient is Alert, Appropriate and Oriented Extremity - Neurovascular intact Sensation intact distally Intact pulses distally Dorsiflexion/Plantar flexion intact No cellulitis present Compartment soft Dressing - dressing C/D/I and no drainage, prevena intact with out drainage Motor Function - intact, moving foot and toes well on exam.   Past Medical History:  Diagnosis Date  . Anxiety   . Complication of anesthesia    difficulty waking up after back surgery. o2 sats dropped low.   . Depression   . GERD (gastroesophageal reflux disease)   . Heart murmur    no treatment  . History of kidney stones   . Lymphedema    left ankle. sometimes uses compression socks  . Neuralgia   . Osteoarthritis   . Pneumonia   . Raynaud disease     Assessment/Plan:   2 Days Post-Op Procedure(s) (LRB): Left total knee arthroplasty revision of tibial poly, possible patella Rachelle Hora to Assist (Left) Active Problems:   S/P revision of total knee  Estimated body mass index is 37.28 kg/m as calculated from the following:   Height as of 08/04/20: 6\' 1"  (1.854 m).   Weight as of 09/02/20: 128.2 kg. Advance diet Up with therapy  Work on bowel movement VSS Overall pain improving but she does describe flareup of her peripheral neuropathy.  Will increase gabapentin from 200 mg 3 times daily to 300 mg 3 times daily.  No sign of blood clots or abnormal swelling throughout the lower extremities. CM to assist with discharge to home with HHPT Sunday  DVT Prophylaxis - Lovenox, Foot Pumps and TED hose Weight-Bearing as tolerated to left leg   T. Rachelle Hora, PA-C [place] 09/12/2020, 8:40 AM

## 2020-09-13 MED ORDER — METHOCARBAMOL 500 MG PO TABS: 500.0000 mg | ORAL_TABLET | Freq: Four times a day (QID) | ORAL | 0 refills | Status: DC | PRN

## 2020-09-13 MED ORDER — DOCUSATE SODIUM 100 MG PO CAPS: 100.0000 mg | ORAL_CAPSULE | Freq: Two times a day (BID) | ORAL | 0 refills | Status: DC

## 2020-09-13 MED ORDER — MAGNESIUM HYDROXIDE 400 MG/5ML PO SUSP: 30.0000 mL | Freq: Once | ORAL | Status: DC

## 2020-09-13 MED ORDER — GABAPENTIN 300 MG PO CAPS: 300.0000 mg | ORAL_CAPSULE | Freq: Three times a day (TID) | ORAL | 0 refills | Status: DC

## 2020-09-13 MED ORDER — ENOXAPARIN SODIUM 40 MG/0.4ML ~~LOC~~ SOLN: 40.0000 mg | SUBCUTANEOUS | 0 refills | Status: DC

## 2020-09-13 MED ORDER — BISACODYL 10 MG RE SUPP
10.0000 mg | Freq: Once | RECTAL | Status: AC
  Administered 2020-09-13: 10 mg via RECTAL
  Filled 2020-09-13: qty 1

## 2020-09-13 MED ORDER — OXYCODONE HCL 10 MG PO TABS
10.0000 mg | ORAL_TABLET | ORAL | 0 refills | Status: DC | PRN
Start: 2020-09-13 — End: 2021-10-18

## 2020-09-13 NOTE — Discharge Summary (Signed)
Physician Discharge Summary  Patient ID: Lisa Stein MRN: [id number] DOB/AGE: Dec 10, 1960 59 y.o.  Admit date: 09/10/2020 Discharge date:09/14/2020 Admission Diagnoses:  S/P revision of total knee [Z96.659]   Discharge Diagnoses: Patient Active Problem List   Diagnosis Date Noted  . S/P revision of total knee 09/10/2020  . Numbness and tingling of foot 10/01/2019  . Primary osteoarthritis of left knee 12/19/2016  . Community acquired pneumonia 11/24/2016  . Sepsis ([place]) 11/17/2016  . Skin nodule 09/05/2016  . Menopause 07/21/2016  . Clinical depression 01/12/2016  . Difficult or painful urination 03/16/2015  . Fatigue 03/16/2015  . Anxiety, generalized 03/16/2015  . Acid reflux 03/16/2015  . Cardiac murmur 03/16/2015  . H/O major abdominal surgery 03/16/2015  . Calculus of kidney 03/16/2015  . Acquired lymphedema 03/16/2015  . Affective disorder, major 03/16/2015  . Arthritis, degenerative 03/16/2015  . Adiposity 03/16/2015  . Raynaud's syndrome 03/16/2015  . Muscle rigidity 03/16/2015  . Fothergill's neuralgia 03/16/2015  . Avitaminosis D 03/16/2015  . DDD (degenerative disc disease), lumbar 11/20/2014  . Neuritis or radiculitis due to rupture of lumbar intervertebral disc 11/20/2014  . Lower abdominal pain 08/05/2014  . Diarrhea 08/05/2014    Past Medical History:  Diagnosis Date  . Anxiety   . Complication of anesthesia    difficulty waking up after back surgery. o2 sats dropped low.  . Depression   . GERD (gastroesophageal reflux disease)   . Heart murmur    no treatment  . History of kidney stones   . Lymphedema    left ankle. sometimes uses compression socks  . Neuralgia   . Osteoarthritis   . Pneumonia   . Raynaud disease      Transfusion: none   Consultants (if any):   Discharged Condition: Improved  Hospital Course: Lisa Stein is an 59 y.o. female who was admitted 09/10/2020 with a diagnosis of painful left total knee and went to the  operating room on 09/10/2020 and underwent the above named procedures.    Surgeries: Procedure(s): Left total knee arthroplasty revision of tibial poly, possible patella Rachelle Hora to Assist on 09/10/2020 Patient tolerated the surgery well. Taken to PACU where she was stabilized and then transferred to the orthopedic floor.  Started on Lovenox 30 mg q 12 hrs. Foot pumps applied bilaterally at 80 mm. Heels elevated on bed with rolled towels. No evidence of DVT. Negative [place]. Physical therapy started on day #1 for gait training and transfer. OT started day #1 for ADL and assisted devices.  Patient's foley was d/c on day #1. Patient's IV was d/c on day #2. Slow progress with PT>  On post op day #4 patient was refusing SNF and ready for discharge to home with HHPT.  Implants: Stryker triathlon total knee system 32 patella, cemented, with size five 72mm PS tibial insert   She was given perioperative antibiotics:  Anti-infectives (From admission, onward)   Start     Dose/Rate Route Frequency Ordered Stop   09/10/20 1330  ceFAZolin (ANCEF) IVPB 2g/100 mL premix        2 g 200 mL/hr over 30 Minutes Intravenous Every 6 hours 09/10/20 1147 09/10/20 2031   09/10/20 0600  ceFAZolin (ANCEF) 3 g in dextrose 5 % 50 mL IVPB        3 g 100 mL/hr over 30 Minutes Intravenous On call to O.R. 09/09/20 2156 09/10/20 0734    .  She was given sequential compression devices, early ambulation, and Lovenox, teds for DVT  prophylaxis.  She benefited maximally from the hospital stay and there were no complications.    Recent vital signs:  Vitals:   09/13/20 0437 09/13/20 0741  BP: (!) 119/51 (!) 122/53  Pulse: 89 86  Resp: 16 16  Temp: 97.7 F (36.5 C) 98 F (36.7 C)  SpO2: 96% 100%    Recent laboratory studies:  Lab Results  Component Value Date   HGB 12.4 09/11/2020   HGB 12.4 09/10/2020   HGB 13.2 09/02/2020   Lab Results  Component Value Date   WBC 7.2 09/11/2020   PLT 224 09/11/2020    No results found for: INR Lab Results  Component Value Date   NA 139 09/11/2020   K 4.5 09/11/2020   CL 103 09/11/2020   CO2 28 09/11/2020   BUN 13 09/11/2020   CREATININE 0.84 09/11/2020   GLUCOSE 137 (H) 09/11/2020    Discharge Medications:   Allergies as of 09/13/2020      Reactions   Pregabalin Other (See Comments)   Skin crawling   Sulfa Antibiotics Itching   Tegretol [carbamazepine] Itching   Amitriptyline Swelling   SWELLING IN HANDS   Benzoin Dermatitis   Blisters.   Betadine  [povidone Iodine] Rash   Biaxin [clarithromycin] Diarrhea   GI upset      Medication List    TAKE these medications   acetaminophen 500 MG tablet Commonly known as: TYLENOL Take 1,000 mg by mouth every 6 (six) hours as needed for moderate pain or headache.   ALPRAZolam 0.5 MG tablet Commonly known as: XANAX Take 1 tablet (0.5 mg total) by mouth 2 (two) times daily as needed for anxiety.   busPIRone 10 MG tablet Commonly known as: BUSPAR TAKE 1 TABLET BY MOUTH TWICE A DAY   celecoxib 200 MG capsule Commonly known as: CELEBREX TAKE 1 CAPSULE BY MOUTH  DAILY   cholecalciferol 25 MCG (1000 UNIT) tablet Commonly known as: VITAMIN D3 Take 3,000 Units by mouth daily.   diphenhydrAMINE 25 MG tablet Commonly known as: BENADRYL Take 50 mg by mouth at bedtime.   docusate sodium 100 MG capsule Commonly known as: COLACE Take 1 capsule (100 mg total) by mouth 2 (two) times daily.   enoxaparin 40 MG/0.4ML injection Commonly known as: Lovenox Inject 0.4 mLs (40 mg total) into the skin daily for 14 days.   estradiol 1 MG tablet Commonly known as: ESTRACE TAKE 1 TABLET BY MOUTH  DAILY   fluticasone 50 MCG/ACT nasal spray Commonly known as: FLONASE Place 1 spray into the nose daily as needed for allergies.   gabapentin 300 MG capsule Commonly known as: NEURONTIN Take 1 capsule (300 mg total) by mouth 3 (three) times daily. What changed:   medication strength  how much to  take   Lidocaine 4 % Ptch Apply 1 patch topically daily as needed (pain).   methocarbamol 500 MG tablet Commonly known as: ROBAXIN Take 1 tablet (500 mg total) by mouth every 6 (six) hours as needed for muscle spasms.   omeprazole 20 MG capsule Commonly known as: PRILOSEC TAKE 1 CAPSULE BY MOUTH  TWICE DAILY WITH FOOD What changed: See the new instructions.   Oxycodone HCl 10 MG Tabs Take 1-1.5 tablets (10-15 mg total) by mouth every 4 (four) hours as needed for severe pain (pain score 7-10).   phenylephrine 10 MG Tabs tablet Commonly known as: SUDAFED PE Take 10 mg by mouth every 4 (four) hours as needed (congestion / sinuses).   sertraline 100  MG tablet Commonly known as: ZOLOFT TAKE 2 TABLETS BY MOUTH  DAILY What changed: when to take this            Durable Medical Equipment  (From admission, onward)         Start     Ordered   09/10/20 1148  DME Walker rolling  Once       Question Answer Comment  Walker: With Nord Wheels   Patient needs a walker to treat with the following condition S/P revision of total knee      09/10/20 1147   09/10/20 1148  DME 3 n 1  Once        09/10/20 1147   09/10/20 1148  DME Bedside commode  Once       Question:  Patient needs a bedside commode to treat with the following condition  Answer:  S/P revision of total knee   09/10/20 1147          Diagnostic Studies: DG Knee 1-2 Views Left  Result Date: 09/10/2020 CLINICAL DATA:  Status post left knee replacement. EXAM: LEFT KNEE - 1-2 VIEW COMPARISON:  None. FINDINGS: The left femoral and tibial components are well situated. Expected postoperative changes are seen in the soft tissues anteriorly. IMPRESSION: Status post left total knee arthroplasty. Electronically Signed   By: Marijo Conception M.D.   On: 09/10/2020 10:15    Disposition:      Follow-up Information    Duanne Guess, PA-C Follow up in 2 day(s).   Specialties: Orthopedic Surgery, Emergency Medicine Contact  information: [place] [address] [phone]                Signed: Feliberto Gottron 09/13/2020, 9:39 AM

## 2020-09-13 NOTE — Progress Notes (Signed)
   Subjective: 3 Days Post-Op Procedure(s) (LRB): Left total knee arthroplasty revision of tibial poly, possible patella Rachelle Hora to Assist (Left) Patient reports pain as mild.  She can tell a difference with the neuropathy pain with the increase in the gabapentin. Patient is well, and has had no acute complaints or problems Denies any CP, SOB, ABD pain. We will continue therapy today.  Plan is to go Home after hospital stay.  Slow progress of physical therapy last 2 days.  Despite PT recommendation for skilled nurse facility she prefers to go home  Objective: Vital signs in last 24 hours: Temp:  [97.7 F (36.5 C)-98.5 F (36.9 C)] 98 F (36.7 C) (12/12 0741) Pulse Rate:  [83-94] 86 (12/12 0741) Resp:  [15-20] 16 (12/12 0741) BP: (105-127)/(41-63) 122/53 (12/12 0741) SpO2:  [93 %-100 %] 100 % (12/12 0741)  Intake/Output from previous day: 12/11 0701 - 12/12 0700 In: 240 [P.O.:240] Out: -  Intake/Output this shift: No intake/output data recorded.  Recent Labs    09/10/20 1217 09/11/20 0746  HGB 12.4 12.4   Recent Labs    09/10/20 1217 09/11/20 0746  WBC 7.5 7.2  RBC 4.70 4.63  HCT 40.8 40.5  PLT 225 224   Recent Labs    09/10/20 1217 09/11/20 0746  NA  --  139  K  --  4.5  CL  --  103  CO2  --  28  BUN  --  13  CREATININE 0.90 0.84  GLUCOSE  --  137*  CALCIUM  --  9.1   No results for input(s): LABPT, INR in the last 72 hours.  EXAM General - Patient is Alert, Appropriate and Oriented Extremity - Neurovascular intact Sensation intact distally Intact pulses distally Dorsiflexion/Plantar flexion intact No cellulitis present Compartment soft Dressing - dressing C/D/I and no drainage, prevena intact with out drainage Motor Function - intact, moving foot and toes well on exam.   Past Medical History:  Diagnosis Date  . Anxiety   . Complication of anesthesia    difficulty waking up after back surgery. o2 sats dropped low.  . Depression   . GERD  (gastroesophageal reflux disease)   . Heart murmur    no treatment  . History of kidney stones   . Lymphedema    left ankle. sometimes uses compression socks  . Neuralgia   . Osteoarthritis   . Pneumonia   . Raynaud disease     Assessment/Plan:   3 Days Post-Op Procedure(s) (LRB): Left total knee arthroplasty revision of tibial poly, possible patella Rachelle Hora to Assist (Left) Active Problems:   S/P revision of total knee  Estimated body mass index is 37.28 kg/m as calculated from the following:   Height as of 08/04/20: 6\' 1"  (1.854 m).   Weight as of 09/02/20: 128.2 kg. Advance diet Up with therapy  Work on bowel movement.  Suppository ordered today. VSS Pain better controlled with gabapentin 300 mg 3 times daily. CM to assist with discharge.  Possible discharge to home today if she is able to complete all physical therapy goals and physical therapy think she is safe enough to return home.  Based on progress over the last 2 days I do believe most likely she will need to hold off on discharge until Monday  DVT Prophylaxis - Lovenox, Foot Pumps and TED hose Weight-Bearing as tolerated to left leg   T. Rachelle Hora, PA-C [place] 09/13/2020, 9:33 AM

## 2020-09-13 NOTE — Progress Notes (Signed)
Physical Therapy Treatment Patient Details Name: Lisa Stein MRN: [id number] DOB: Mar 02, 1961 Today's Date: 09/13/2020    History of Present Illness Pt admitted for revision of L TKR secondary to loose patellar component. HIstory includes depression, GERD, lymphedema, obesity, and chronic neuropathy.    PT Comments    Patient is making slow progress overall with progression of activity. Patient was limited by nausea (without vomiting) and pain in left leg with activity. Patient continues to require assistance for bed mobility, however states she will be sleeping in a recliner chair at home. With bed height elevated, patient does not need physical assistance for standing, supervision for safety. Patient unable to progress ambulating past a few steps today, limited by nausea and 8/10 burning pain reported in lateral left thigh. Patient would likely benefit from SNF which is recommended, however she is adamantly refusing SNF and wants to go home with the assistance from her family. She has a wheelchair in place at home and walker already delivered to room. Recommend 3-in-1 commode for home use and assistance with mobility for safety and fall prevention. PT will continue to follow to maximize independence and address remaining functional limitations.    Follow Up Recommendations  SNF;Supervision for mobility/OOB     Equipment Recommendations  3in1 (PT)    Recommendations for Other Services       Precautions / Restrictions Precautions Precautions: Fall;Knee Precaution Booklet Issued: Yes (comment) Restrictions Weight Bearing Restrictions: Yes LLE Weight Bearing: Weight bearing as tolerated    Mobility  Bed Mobility Overal bed mobility: Needs Assistance Bed Mobility: Supine to Sit;Sit to Supine     Supine to sit: HOB elevated;Min assist Sit to supine: Mod assist   General bed mobility comments: assistance for LLE support for bed mobility. Increased assistance required for return  to bed  Transfers Overall transfer level: Needs assistance Equipment used: Rolling walker (2 wheeled) Transfers: Sit to/from Stand Sit to Stand: From elevated surface;Supervision         General transfer comment: with bed in elevated position, patient does not require physical assistance for standing. cues for safety  Ambulation/Gait             General Gait Details: patient only able to ambulate ~ 2 ft with Min guard using rolling walker before becoming nauseous. patient dry heaving with burping with no vomit noted. unable to progress activity further at this time due to nause. patient also reported pain in left IT band area increased from 0/10 at rest to 8/10 burning pain with standing activity   Stairs             Wheelchair Mobility    Modified Rankin (Stroke Patients Only)       Balance Overall balance assessment: Needs assistance Sitting-balance support: Feet supported Sitting balance-Leahy Scale: Good Sitting balance - Comments: no loss of balance noted in sitting position   Standing balance support: Bilateral upper extremity supported Standing balance-Leahy Scale: Good Standing balance comment: with rolling walker for support in standing                            Cognition Arousal/Alertness: Awake/alert Behavior During Therapy: WFL for tasks assessed/performed Overall Cognitive Status: Within Functional Limits for tasks assessed  Exercises Total Joint Exercises Ankle Circles/Pumps: AROM;Strengthening;Left;10 reps;Supine Quad Sets: AROM;Strengthening;Left;10 reps;Supine Straight Leg Raises: AAROM;Strengthening;Left;10 reps;Supine Long Arc Quad: AROM;Strengthening;Left;10 reps;Seated (through limited ROM, limited by pain) Goniometric ROM: left knee 2-64 degrees Other Exercises Other Exercises: verbal cues for exercise technique    General Comments        Pertinent Vitals/Pain  Pain Assessment: 0-10 Pain Score: 8  Pain Location: left IT band area (lateral thigh) Pain Descriptors / Indicators: Moaning;Guarding;Grimacing;Burning Pain Intervention(s): Premedicated before session;Monitored during session;Limited activity within patient's tolerance    Home Living                      Prior Function            PT Goals (current goals can now be found in the care plan section) Acute Rehab PT Goals Patient Stated Goal: to go home PT Goal Formulation: With patient Time For Goal Achievement: 09/24/20 Potential to Achieve Goals: Good Progress towards PT goals: Progressing toward goals    Frequency    BID      PT Plan Current plan remains appropriate    Co-evaluation              AM-PAC PT "6 Clicks" Mobility   Outcome Measure  Help needed turning from your back to your side while in a flat bed without using bedrails?: A Little Help needed moving from lying on your back to sitting on the side of a flat bed without using bedrails?: A Little Help needed moving to and from a bed to a chair (including a wheelchair)?: A Little Help needed standing up from a chair using your arms (e.g., wheelchair or bedside chair)?: A Little Help needed to walk in hospital room?: A Little Help needed climbing 3-5 steps with a railing? : Total 6 Click Score: 16    End of Session   Activity Tolerance:  (limited due to nausea) Patient left: in bed;with call bell/phone within reach;with SCD's reapplied (polar care reapplied) Nurse Communication: Mobility status PT Visit Diagnosis: Muscle weakness (generalized) (M62.81);Difficulty in walking, not elsewhere classified (R26.2);Pain Pain - Right/Left: Left Pain - part of body: Leg     Time: 9826-4158 PT Time Calculation (min) (ACUTE ONLY): 31 min  Charges:  $Therapeutic Exercise: 8-22 mins $Therapeutic Activity: 8-22 mins                     Minna Merritts, PT, MPT    Percell Locus 09/13/2020, 1:04  PM

## 2020-09-13 NOTE — Discharge Instructions (Signed)
TOTAL KNEE REVISION POSTOPERATIVE DIRECTIONS  Knee Rehabilitation, Guidelines Following Surgery  Results after knee surgery are often greatly improved when you follow the exercise, range of motion and muscle strengthening exercises prescribed by your doctor. Safety measures are also important to protect the knee from further injury. Any time any of these exercises cause you to have increased pain or swelling in your knee joint, decrease the amount until you are comfortable again and slowly increase them. If you have problems or questions, call your caregiver or physical therapist for advice.   HOME CARE INSTRUCTIONS  Remove items at home which could result in a fall. This includes throw rugs or furniture in walking pathways.   Continue to use polar care unit on the knee for pain and swelling from surgery. You may notice swelling that will progress down to the foot and ankle.  This is normal after surgery.  Elevate the leg when you are not up walking on it.    Continue to use the breathing machine which will help keep your temperature down.  It is common for your temperature to cycle up and down following surgery, especially at night when you are not up moving around and exerting yourself.  The breathing machine keeps your lungs expanded and your temperature down.  Do not place pillow under knee, focus on keeping the knee STRAIGHT while resting  DIET You may resume your previous home diet once your are discharged from the hospital.  DRESSING / WOUND CARE / SHOWERING Please remove provena negative pressure dressing on 09/21/2020 and apply honey comb dressing. Keep dressing clean and dry at all times.  ACTIVITY Walk with your walker as instructed. Use walker as long as suggested by your caregivers. Avoid periods of inactivity such as sitting longer than an hour when not asleep. This helps prevent blood clots.  You may resume a sexual relationship in one month or when given the OK by your  doctor.  You may return to work once you are cleared by your doctor.  Do not drive a car for 6 weeks or until released by you surgeon.  Do not drive while taking narcotics.  WEIGHT BEARING Weight bearing as tolerated with assist device (walker, cane, etc) as directed, use it as long as suggested by your surgeon or therapist, typically at least 4-6 weeks.  POSTOPERATIVE CONSTIPATION PROTOCOL Constipation - defined medically as fewer than three stools per week and severe constipation as less than one stool per week.  One of the most common issues patients have following surgery is constipation.  Even if you have a regular bowel pattern at home, your normal regimen is likely to be disrupted due to multiple reasons following surgery.  Combination of anesthesia, postoperative narcotics, change in appetite and fluid intake all can affect your bowels.  In order to avoid complications following surgery, here are some recommendations in order to help you during your recovery period.  Colace (docusate) - Pick up an over-the-counter form of Colace or another stool softener and take twice a day as long as you are requiring postoperative pain medications.  Take with a full glass of water daily.  If you experience loose stools or diarrhea, hold the colace until you stool forms back up.  If your symptoms do not get better within 1 week or if they get worse, check with your doctor.  Dulcolax (bisacodyl) - Pick up over-the-counter and take as directed by the product packaging as needed to assist with the movement of  your bowels.  Take with a full glass of water.  Use this product as needed if not relieved by Colace only.   MiraLax (polyethylene glycol) - Pick up over-the-counter to have on hand.  MiraLax is a solution that will increase the amount of water in your bowels to assist with bowel movements.  Take as directed and can mix with a glass of water, juice, soda, coffee, or tea.  Take if you go more than two  days without a movement. Do not use MiraLax more than once per day. Call your doctor if you are still constipated or irregular after using this medication for 7 days in a row.  If you continue to have problems with postoperative constipation, please contact the office for further assistance and recommendations.  If you experience "the worst abdominal pain ever" or develop nausea or vomiting, please contact the office immediatly for further recommendations for treatment.  ITCHING  If you experience itching with your medications, try taking only a single pain pill, or even half a pain pill at a time.  You can also use Benadryl over the counter for itching or also to help with sleep.   TED HOSE STOCKINGS Wear the elastic stockings on both legs for six weeks following surgery during the day but you may remove then at night for sleeping.  MEDICATIONS See your medication summary on the "After Visit Summary" that the nursing staff will review with you prior to discharge.  You may have some home medications which will be placed on hold until you complete the course of blood thinner medication.  It is important for you to complete the blood thinner medication as prescribed by your surgeon.  Continue your approved medications as instructed at time of discharge.  PRECAUTIONS If you experience chest pain or shortness of breath - call 911 immediately for transfer to the hospital emergency department.  If you develop a fever greater that 101 F, purulent drainage from wound, increased redness or drainage from wound, foul odor from the wound/dressing, or calf pain - CONTACT YOUR SURGEON.                                                   FOLLOW-UP APPOINTMENTS Make sure you keep all of your appointments after your operation with your surgeon and caregivers. You should call the office at the above phone number and make an appointment for approximately two weeks after the date of your surgery or on the date  instructed by your surgeon outlined in the "After Visit Summary".   RANGE OF MOTION AND STRENGTHENING EXERCISES  Rehabilitation of the knee is important following a knee injury or an operation. After just a few days of immobilization, the muscles of the thigh which control the knee become weakened and shrink (atrophy). Knee exercises are designed to build up the tone and strength of the thigh muscles and to improve knee motion. Often times heat used for twenty to thirty minutes before working out will loosen up your tissues and help with improving the range of motion but do not use heat for the first two weeks following surgery. These exercises can be done on a training (exercise) mat, on the floor, on a table or on a bed. Use what ever works the best and is most comfortable for you Knee exercises include:  Leg Lifts -  While your knee is still immobilized in a splint or cast, you can do straight leg raises. Lift the leg to 60 degrees, hold for 3 sec, and slowly lower the leg. Repeat 10-20 times 2-3 times daily. Perform this exercise against resistance later as your knee gets better.  Quad and Hamstring Sets - Tighten up the muscle on the front of the thigh (Quad) and hold for 5-10 sec. Repeat this 10-20 times hourly. Hamstring sets are done by pushing the foot backward against an object and holding for 5-10 sec. Repeat as with quad sets.   Leg Slides: Lying on your back, slowly slide your foot toward your buttocks, bending your knee up off the floor (only go as far as is comfortable). Then slowly slide your foot back down until your leg is flat on the floor again.  Angel Wings: Lying on your back spread your legs to the side as far apart as you can without causing discomfort.  A rehabilitation program following serious knee injuries can speed recovery and prevent re-injury in the future due to weakened muscles. Contact your doctor or a physical therapist for more information on knee rehabilitation.   IF  YOU ARE TRANSFERRED TO A SKILLED REHAB FACILITY If the patient is transferred to a skilled rehab facility following release from the hospital, a list of the current medications will be sent to the facility for the patient to continue.  When discharged from the skilled rehab facility, please have the facility set up the patient's [place] prior to being released. Also, the skilled facility will be responsible for providing the patient with their medications at time of release from the facility to include their pain medication, the muscle relaxants, and their blood thinner medication. If the patient is still at the rehab facility at time of the two week follow up appointment, the skilled rehab facility will also need to assist the patient in arranging follow up appointment in our office and any transportation needs.  MAKE SURE YOU:  Understand these instructions.  Get help right away if you are not doing well or get worse.

## 2020-09-14 NOTE — Progress Notes (Signed)
Physical Therapy Treatment Patient Details Name: Lisa Stein MRN: [id number] DOB: [birth year] Today's Date: 09/14/2020    History of Present Illness Pt admitted for revision of L TKR secondary to loose patellar component. HIstory includes depression, GERD, lymphedema, obesity, and chronic neuropathy.    PT Comments    Pt is making good progress towards goals. Pt able to ambulate multiple laps in room with safe technique. Good endurance with there-ex and making gradual progress with ROM. Reports she will use WC and ramp for entering/exiting home. Discussed car transfers. Messaged CM about getting BSC prior to discharge. Has met all PT goals and is excited about discharge this date. Care team notified.   Follow Up Recommendations  Home health PT;Supervision for mobility/OOB     Equipment Recommendations  3in1 (PT)    Recommendations for Other Services       Precautions / Restrictions Precautions Precautions: Fall;Knee Precaution Booklet Issued: Yes (comment) Restrictions Weight Bearing Restrictions: Yes LLE Weight Bearing: Weight bearing as tolerated    Mobility  Bed Mobility Overal bed mobility: Needs Assistance Bed Mobility: Supine to Sit;Sit to Supine     Supine to sit: Min assist     General bed mobility comments: needs assist for lowering L LE to floor. Bed height elevated for decreased pain  Transfers Overall transfer level: Needs assistance Equipment used: Rolling walker (2 wheeled) Transfers: Sit to/from Stand Sit to Stand: From elevated surface;Supervision         General transfer comment: with bed in elevated position, patient does not require physical assistance for standing. cues for safety  Ambulation/Gait Ambulation/Gait assistance: Supervision Gait Distance (Feet): 120 Feet Assistive device: Rolling walker (2 wheeled) Gait Pattern/deviations: Step-through pattern     General Gait Details: improved technique from doing multiple laps in room.  Cues for increased knee flexion during swing phase. Upright posture noted   Stairs             Wheelchair Mobility    Modified Rankin (Stroke Patients Only)       Balance Overall balance assessment: Needs assistance Sitting-balance support: Feet supported Sitting balance-Leahy Scale: Good Sitting balance - Comments: no loss of balance noted in sitting position   Standing balance support: Bilateral upper extremity supported Standing balance-Leahy Scale: Good                              Cognition Arousal/Alertness: Awake/alert Behavior During Therapy: WFL for tasks assessed/performed Overall Cognitive Status: Within Functional Limits for tasks assessed                                 General Comments: Pt is A and O x 4      Exercises Total Joint Exercises Goniometric ROM: L knee AAROM: 0-70 degrees Other Exercises Other Exercises: Supine/seated ther-ex performed on L LE including AP, quad sets, SLR, SAQ, and hip abd/add. All ther-ex performed x 12 reps with min assist. Other Exercises: ambulated over to [facility] to urinate. Supervision given. Able to perform self hygiene.    General Comments        Pertinent Vitals/Pain Pain Assessment: 0-10 Pain Score: 1  Pain Location: left IT band area (lateral thigh) Pain Descriptors / Indicators: Moaning;Guarding;Grimacing;Burning Pain Intervention(s): Limited activity within patient's tolerance    Home Living  Prior Function            PT Goals (current goals can now be found in the care plan section) Acute Rehab PT Goals Patient Stated Goal: to go home PT Goal Formulation: With patient Time For Goal Achievement: 09/24/20 Potential to Achieve Goals: Good Progress towards PT goals: Progressing toward goals    Frequency    BID      PT Plan Discharge plan needs to be updated    Co-evaluation              AM-PAC PT "6 Clicks" Mobility   Outcome  Measure  Help needed turning from your back to your side while in a flat bed without using bedrails?: A Little Help needed moving from lying on your back to sitting on the side of a flat bed without using bedrails?: A Little Help needed moving to and from a bed to a chair (including a wheelchair)?: A Little Help needed standing up from a chair using your arms (e.g., wheelchair or bedside chair)?: A Little Help needed to walk in hospital room?: A Little Help needed climbing 3-5 steps with a railing? : Total 6 Click Score: 16    End of Session Equipment Utilized During Treatment: Gait belt Activity Tolerance: Patient tolerated treatment well;Patient limited by pain Patient left: in chair;with SCD's reapplied;with family/visitor present Nurse Communication: Mobility status PT Visit Diagnosis: Muscle weakness (generalized) (M62.81);Difficulty in walking, not elsewhere classified (R26.2);Pain Pain - Right/Left: Left Pain - part of body: Leg     Time: 0912-0956 PT Time Calculation (min) (ACUTE ONLY): 44 min  Charges:  $Gait Training: 8-22 mins $Therapeutic Exercise: 8-22 mins $Therapeutic Activity: 8-22 mins                     Greggory Stallion, PT, DPT [phone]    Ollen Rao 09/14/2020, 12:32 PM

## 2020-09-14 NOTE — Plan of Care (Signed)

## 2020-09-14 NOTE — Progress Notes (Signed)
   Subjective: 4 Days Post-Op Procedure(s) (LRB): Left total knee arthroplasty revision of tibial poly, possible patella Rachelle Hora to Assist (Left) Patient reports pain as mild.  Pain improving. Patient is well, and has had no acute complaints or problems Denies any CP, SOB, ABD pain. We will continue therapy today.  Plan is to go Home after hospital stay.  Patient refuses SNF  Objective: Vital signs in last 24 hours: Temp:  [98.1 F (36.7 C)-98.8 F (37.1 C)] 98.2 F (36.8 C) (12/13 0804) Pulse Rate:  [72-89] 86 (12/13 0804) Resp:  [16-18] 16 (12/13 0804) BP: (113-125)/(44-99) 113/99 (12/13 0804) SpO2:  [95 %-99 %] 98 % (12/13 0804)  Intake/Output from previous day: 12/12 0701 - 12/13 0700 In: 360 [P.O.:360] Out: -  Intake/Output this shift: No intake/output data recorded.  No results for input(s): HGB in the last 72 hours. No results for input(s): WBC, RBC, HCT, PLT in the last 72 hours. No results for input(s): NA, K, CL, CO2, BUN, CREATININE, GLUCOSE, CALCIUM in the last 72 hours. No results for input(s): LABPT, INR in the last 72 hours.  EXAM General - Patient is Alert, Appropriate and Oriented Extremity - Neurovascular intact Sensation intact distally Intact pulses distally Dorsiflexion/Plantar flexion intact No cellulitis present Compartment soft Dressing - dressing C/D/I and no drainage, prevena intact with out drainage Motor Function - intact, moving foot and toes well on exam.   Past Medical History:  Diagnosis Date  . Anxiety   . Complication of anesthesia    difficulty waking up after back surgery. o2 sats dropped low.  . Depression   . GERD (gastroesophageal reflux disease)   . Heart murmur    no treatment  . History of kidney stones   . Lymphedema    left ankle. sometimes uses compression socks  . Neuralgia   . Osteoarthritis   . Pneumonia   . Raynaud disease     Assessment/Plan:   4 Days Post-Op Procedure(s) (LRB): Left total knee  arthroplasty revision of tibial poly, possible patella Rachelle Hora to Assist (Left) Active Problems:   S/P revision of total knee  Estimated body mass index is 37.28 kg/m as calculated from the following:   Height as of 08/04/20: 6\' 1"  (1.854 m).   Weight as of 09/02/20: 128.2 kg. Advance diet Up with therapy . VSS CM to assist with discharge to home with HHPT after afternoon PT  DVT Prophylaxis - Lovenox, Foot Pumps and TED hose Weight-Bearing as tolerated to left leg   T. Rachelle Hora, PA-C [facility] 09/14/2020, 8:07 AM

## 2020-09-28 ENCOUNTER — Encounter: Payer: Self-pay | Admitting: Family Medicine

## 2020-09-29 ENCOUNTER — Other Ambulatory Visit: Payer: Self-pay | Admitting: *Deleted

## 2020-09-29 ENCOUNTER — Telehealth: Payer: Self-pay

## 2020-09-29 DIAGNOSIS — R928 Other abnormal and inconclusive findings on diagnostic imaging of breast: Secondary | ICD-10-CM

## 2020-09-29 NOTE — Telephone Encounter (Signed)
Mammogram ordered

## 2020-09-29 NOTE — Telephone Encounter (Signed)
Copied from CRM [id number]. Topic: Referral - Request for Referral >> Sep 29, 2020  3:26 PM Cuthrell, Pearlean Brownie wrote: Has patient seen PCP for this complaint? N/A  Referral for which specialty: N/A Preferred provider/office: [place] Location (patient does not want to be referred to [place]) Reason for referral: Diagnostic  6 months Mammogram Follow as per [facility] recommendation

## 2020-10-06 ENCOUNTER — Other Ambulatory Visit: Payer: Self-pay

## 2020-10-06 ENCOUNTER — Other Ambulatory Visit
Admission: RE | Admit: 2020-10-06 | Discharge: 2020-10-06 | Disposition: A | Discharge status: Home / Self Care | Payer: No Typology Code available for payment source | Source: Ambulatory Visit | Attending: Gastroenterology | Admitting: Gastroenterology

## 2020-10-06 DIAGNOSIS — Z20822 Contact with and (suspected) exposure to covid-19: Secondary | ICD-10-CM | POA: Diagnosis not present

## 2020-10-06 DIAGNOSIS — Z01812 Encounter for preprocedural laboratory examination: Secondary | ICD-10-CM | POA: Insufficient documentation

## 2020-10-07 LAB — SARS CORONAVIRUS 2 (TAT 6-24 HRS): SARS Coronavirus 2: NEGATIVE

## 2020-10-08 ENCOUNTER — Ambulatory Visit
Admission: RE | Admit: 2020-10-08 | Discharge: 2020-10-08 | Disposition: A | Discharge status: Home / Self Care | Payer: No Typology Code available for payment source | Attending: Gastroenterology | Admitting: Gastroenterology

## 2020-10-08 ENCOUNTER — Encounter
Admission: RE | Disposition: A | Discharge status: Home / Self Care | Payer: Self-pay | Source: Home / Self Care | Attending: Gastroenterology

## 2020-10-08 ENCOUNTER — Telehealth: Payer: Self-pay

## 2020-10-08 ENCOUNTER — Encounter: Payer: Self-pay | Admitting: Gastroenterology

## 2020-10-08 DIAGNOSIS — Z538 Procedure and treatment not carried out for other reasons: Secondary | ICD-10-CM | POA: Diagnosis not present

## 2020-10-08 DIAGNOSIS — Z1211 Encounter for screening for malignant neoplasm of colon: Secondary | ICD-10-CM | POA: Insufficient documentation

## 2020-10-08 SURGERY — COLONOSCOPY WITH PROPOFOL
Anesthesia: General

## 2020-10-08 MED ORDER — PROPOFOL 10 MG/ML IV BOLUS
INTRAVENOUS | Status: AC
  Filled 2020-10-08: qty 60

## 2020-10-08 MED ORDER — SODIUM CHLORIDE 0.9 % IV SOLN: INTRAVENOUS | Status: DC

## 2020-10-08 MED ORDER — LIDOCAINE HCL (PF) 2 % IJ SOLN
INTRAMUSCULAR | Status: AC
  Filled 2020-10-08: qty 30

## 2020-10-08 MED ORDER — PHENYLEPHRINE HCL (PRESSORS) 10 MG/ML IV SOLN
INTRAVENOUS | Status: AC
  Filled 2020-10-08: qty 1

## 2020-10-08 MED ORDER — SEVOFLURANE IN SOLN
RESPIRATORY_TRACT | Status: AC
  Filled 2020-10-08: qty 250

## 2020-10-08 MED ORDER — PROPOFOL 500 MG/50ML IV EMUL
INTRAVENOUS | Status: AC
  Filled 2020-10-08: qty 150

## 2020-10-08 NOTE — Telephone Encounter (Signed)
-----   Message from Toney Reil, MD sent at 10/08/2020  8:13 AM EST ----- Let's do a 2 day prep  RV

## 2020-10-08 NOTE — Telephone Encounter (Signed)
Lisa Cove, RN  Registered Nurse  Nursing  Progress Notes    Signed  Date of Service:  10/08/2020 7:45 AM           Signed           Show:Clear all [x] Manual[] Template[] Copied  Added by: [x] , RN   [] Hover for details  [name] had concerns about not being cleaned out enough for the procedure. She did not begin to have bowel movements until early this morning and could not see the bottom of the toilet after her bowel movements. She was agreeable to calling Dr. Fredrik Stein office to reschedule.

## 2020-10-08 NOTE — Progress Notes (Signed)
Lisa Stein had concerns about not being cleaned out enough for the procedure. She did not begin to have bowel movements until early this morning and could not see the bottom of the toilet after her bowel movements. She was agreeable to calling Dr. Verdis Prime office to reschedule.

## 2020-10-08 NOTE — Telephone Encounter (Signed)
Called patient to get procedure rescheduled to a 2 day prep. She states she is not feeling well and does not want to scheduled the procedure at this time. She states she will call us back when she is ready to scheduled the procedure

## 2020-10-14 ENCOUNTER — Inpatient Hospital Stay
Admission: RE | Admit: 2020-10-14 | Discharge: 2020-10-14 | Disposition: A | Discharge status: Home / Self Care | Payer: Self-pay | Source: Ambulatory Visit | Attending: *Deleted | Admitting: *Deleted

## 2020-10-14 ENCOUNTER — Other Ambulatory Visit: Payer: Self-pay | Admitting: *Deleted

## 2020-10-14 ENCOUNTER — Other Ambulatory Visit: Payer: Self-pay | Admitting: Family Medicine

## 2020-10-14 DIAGNOSIS — Z1231 Encounter for screening mammogram for malignant neoplasm of breast: Secondary | ICD-10-CM

## 2020-10-14 DIAGNOSIS — R928 Other abnormal and inconclusive findings on diagnostic imaging of breast: Secondary | ICD-10-CM

## 2020-10-17 ENCOUNTER — Other Ambulatory Visit: Payer: Self-pay | Admitting: Family Medicine

## 2020-10-22 ENCOUNTER — Other Ambulatory Visit: Payer: Self-pay

## 2020-10-22 ENCOUNTER — Ambulatory Visit
Admission: RE | Admit: 2020-10-22 | Discharge: 2020-10-22 | Disposition: A | Discharge status: Home / Self Care | Payer: No Typology Code available for payment source | Source: Ambulatory Visit | Attending: Family Medicine | Admitting: Family Medicine

## 2020-10-22 DIAGNOSIS — R928 Other abnormal and inconclusive findings on diagnostic imaging of breast: Secondary | ICD-10-CM

## 2020-11-05 ENCOUNTER — Other Ambulatory Visit: Payer: Self-pay | Admitting: Family Medicine

## 2020-11-05 DIAGNOSIS — F411 Generalized anxiety disorder: Secondary | ICD-10-CM

## 2020-11-12 ENCOUNTER — Other Ambulatory Visit: Payer: Self-pay | Admitting: Family Medicine

## 2020-11-12 ENCOUNTER — Encounter: Payer: Self-pay | Admitting: Family Medicine

## 2020-11-12 ENCOUNTER — Telehealth: Payer: No Typology Code available for payment source | Admitting: Family Medicine

## 2020-11-12 DIAGNOSIS — J069 Acute upper respiratory infection, unspecified: Secondary | ICD-10-CM

## 2020-11-12 DIAGNOSIS — U071 COVID-19: Secondary | ICD-10-CM

## 2020-11-12 DIAGNOSIS — J0140 Acute pansinusitis, unspecified: Secondary | ICD-10-CM | POA: Diagnosis not present

## 2020-11-12 NOTE — Progress Notes (Addendum)
MyChart Video Visit    Virtual Visit via Video Note   This visit type was conducted due to national recommendations for restrictions regarding the COVID-19 Pandemic (e.g. social distancing) in an effort to limit this patient's exposure and mitigate transmission in our community. This patient is at least at moderate risk for complications without adequate follow up. This format is felt to be most appropriate for this patient at this time. Physical exam was limited by quality of the video and audio technology used for the visit.   Patient location: Home Provider location: Office  I discussed the limitations of evaluation and management by telemedicine and the availability of in person appointments. The patient expressed understanding and agreed to proceed.  Patient: Lisa Stein   DOB: [birth year]   60 y.o. Female  MRN: [id number] Visit Date: 11/12/2020  Today's healthcare provider: [name], MD   Chief Complaint  Patient presents with  . Sinus Problem  . Hyperlipidemia   Subjective    Sinus Problem This is a new problem. Episode onset: 2 days ago. The problem has been gradually improving since onset. Associated symptoms include chills, congestion (head and sinus congestion), coughing (dry cough), headaches, a hoarse voice and sinus pressure. Pertinent negatives include no diaphoresis, ear pain, shortness of breath, sneezing or sore throat. (Stuffy ears) Treatments tried: Sudafed and Flonase. The treatment provided moderate relief.  Patient started 2 days ago with sinus congestion sore throat and chills last night.  She has had no fever no cough. No facial pain.  She is not vaccinated against Covid. She plans to retire January 01, 2021 Lipid/Cholesterol, Follow-up  Last lipid panel Other pertinent labs  Lab Results  Component Value Date   CHOL 287 (H) 06/02/2020   HDL 102 06/02/2020   LDLCALC 163 (H) 06/02/2020   TRIG 128 06/02/2020   CHOLHDL 2.8 06/02/2020   Lab  Results  Component Value Date   ALT 17 09/02/2020   AST 21 09/02/2020   PLT 224 09/11/2020   TSH 1.180 06/02/2020     She was last seen for this 6 months ago.  Management since that visit includes recommend improving diet and increasing exercise.  She reports fair compliance with treatment. She is not having side effects.   Symptoms: No chest pain No chest pressure/discomfort  No dyspnea No lower extremity edema  No numbness or tingling of extremity No orthopnea  No palpitations No paroxysmal nocturnal dyspnea  No speech difficulty No syncope   Current diet: in general, an "unhealthy" diet Current exercise: none  The ASCVD Risk score (Lincoln., et al., 2013) failed to calculate for the following reasons:   The valid HDL cholesterol range is 20 to 100 mg/dL  ---------------------------------------------------------------------------------------------------  Patient would also like to discuss coming off of Estradiol.      Medications: Outpatient Medications Prior to Visit  Medication Sig  . acetaminophen (TYLENOL) 500 MG tablet Take 1,000 mg by mouth every 6 (six) hours as needed for moderate pain or headache.  . ALPRAZolam (XANAX) 0.5 MG tablet Take 1 tablet (0.5 mg total) by mouth 2 (two) times daily as needed for anxiety.  . busPIRone (BUSPAR) 10 MG tablet TAKE 1 TABLET BY MOUTH TWICE A DAY  . celecoxib (CELEBREX) 200 MG capsule TAKE 1 CAPSULE BY MOUTH  DAILY (Patient taking differently: Take 200 mg by mouth daily.)  . cholecalciferol (VITAMIN D3) 25 MCG (1000 UNIT) tablet Take 3,000 Units by mouth daily.  . diphenhydrAMINE (BENADRYL)  25 MG tablet Take 50 mg by mouth at bedtime.  Marland Kitchen estradiol (ESTRACE) 1 MG tablet TAKE 1 TABLET BY MOUTH  DAILY (Patient taking differently: Take 1 mg by mouth daily.)  . fluticasone (FLONASE) 50 MCG/ACT nasal spray Place 1 spray into the nose daily as needed for allergies.   Marland Kitchen gabapentin (NEURONTIN) 300 MG capsule Take 1 capsule (300 mg  total) by mouth 3 (three) times daily.  . methocarbamol (ROBAXIN) 500 MG tablet Take 1 tablet (500 mg total) by mouth every 6 (six) hours as needed for muscle spasms.  Marland Kitchen omeprazole (PRILOSEC) 20 MG capsule TAKE 1 CAPSULE BY MOUTH  TWICE DAILY WITH FOOD (Patient taking differently: Take 20 mg by mouth in the morning and at bedtime.)  . oxyCODONE 10 MG TABS Take 1-1.5 tablets (10-15 mg total) by mouth every 4 (four) hours as needed for severe pain (pain score 7-10).  . phenylephrine (SUDAFED PE) 10 MG TABS tablet Take 10 mg by mouth every 4 (four) hours as needed (congestion / sinuses).  . sertraline (ZOLOFT) 100 MG tablet TAKE 2 TABLETS BY MOUTH  DAILY  . [DISCONTINUED] docusate sodium (COLACE) 100 MG capsule Take 1 capsule (100 mg total) by mouth 2 (two) times daily.  . [DISCONTINUED] enoxaparin (LOVENOX) 40 MG/0.4ML injection Inject 0.4 mLs (40 mg total) into the skin daily for 14 days.  . [DISCONTINUED] Lidocaine 4 % PTCH Apply 1 patch topically daily as needed (pain).   Facility-Administered Medications Prior to Visit  Medication Dose Route Frequency Provider  . methylPREDNISolone acetate (DEPO-MEDROL) injection 80 mg  80 mg Intramuscular Once Jerrol Banana., MD    Review of Systems  Constitutional: Positive for chills. Negative for appetite change, diaphoresis, fatigue and fever.  HENT: Positive for congestion (head and sinus congestion), hoarse voice and sinus pressure. Negative for ear pain, sneezing and sore throat.   Respiratory: Positive for cough (dry cough). Negative for chest tightness and shortness of breath.   Cardiovascular: Negative for chest pain and palpitations.  Gastrointestinal: Negative for abdominal pain, nausea and vomiting.  Neurological: Positive for headaches. Negative for dizziness and weakness.       Objective    LMP  (LMP Unknown)  BP Readings from Last 3 Encounters:  09/14/20 (!) 130/52  09/02/20 (!) 139/92  08/04/20 138/81   Wt Readings from  Last 3 Encounters:  09/02/20 282 lb 9.6 oz (128.2 kg)  08/04/20 280 lb 8 oz (127.2 kg)  05/12/20 284 lb (128.8 kg)      Physical Exam  She is alert and cooperative in no acute distress.  She has no trouble talking and no obvious wheezing.   Assessment & Plan     1. Viral upper respiratory tract infection Check for Covid.  Commend Robitussin lots of fluids. - Novel Coronavirus, NAA ([facility])  2. Subacute pansinusitis This all appears to be viral at this time.  Check for Covid and otherwise supportive care at this time. - Novel Coronavirus, NAA ([facility]) 3.  COVID-19 infection Unvaccinated so refer to ambulatory therapy consideration.  Patient is advised.  She feels okay.  She has no respiratory problems at all.  No follow-ups on file.     I discussed the assessment and treatment plan with the patient. The patient was provided an opportunity to ask questions and all were answered. The patient agreed with the plan and demonstrated an understanding of the instructions.   The patient was advised to call back or seek an in-person evaluation if the  symptoms worsen or if the condition fails to improve as anticipated.  I provided 11 minutes of non-face-to-face time during this encounter.  I, [name], MD, have reviewed all documentation for this visit. The documentation on 11/12/20 for the exam, diagnosis, procedures, and orders are all accurate and complete.   Richard Cranford Mon, MD [facility] [phone] (phone) [phone] (fax)  [place]

## 2020-11-14 LAB — SARS-COV-2, NAA 2 DAY TAT

## 2020-11-14 LAB — NOVEL CORONAVIRUS, NAA: SARS-CoV-2, NAA: DETECTED — AB

## 2020-11-14 NOTE — Addendum Note (Signed)
Addended by: Eulas Post on: 11/14/2020 09:39 AM   Modules accepted: Orders

## 2020-11-15 ENCOUNTER — Telehealth: Payer: Self-pay | Admitting: Unknown Physician Specialty

## 2020-11-15 ENCOUNTER — Encounter: Payer: Self-pay | Admitting: Unknown Physician Specialty

## 2020-11-15 NOTE — Telephone Encounter (Signed)
I connected by phone with Lisa Stein on 11/15/2020 at 10:45 AM to discuss the potential use of a new treatment for mild to moderate COVID-19 viral infection in non-hospitalized patients.  This patient is a 60 y.o. female that meets the FDA criteria for Emergency Use Authorization of COVID monoclonal antibody sotrovimab.  Has a (+) direct SARS-CoV-2 viral test result  Has mild or moderate COVID-19   Is NOT hospitalized due to COVID-19  Is within 10 days of symptom onset  Has at least one of the high risk factor(s) for progression to severe COVID-19 and/or hospitalization as defined in EUA.  Specific high risk criteria : BMI > 25   I have spoken and communicated the following to the patient or parent/caregiver regarding COVID monoclonal antibody treatment:  1. FDA has authorized the emergency use for the treatment of mild to moderate COVID-19 in adults and pediatric patients with positive results of direct SARS-CoV-2 viral testing who are 40 years of age and older weighing at least 40 kg, and who are at high risk for progressing to severe COVID-19 and/or hospitalization.  2. The significant known and potential risks and benefits of COVID monoclonal antibody, and the extent to which such potential risks and benefits are unknown.  3. Information on available alternative treatments and the risks and benefits of those alternatives, including clinical trials.  4. Patients treated with COVID monoclonal antibody should continue to self-isolate and use infection control measures (e.g., wear mask, isolate, social distance, avoid sharing personal items, clean and disinfect "high touch" surfaces, and frequent handwashing) according to CDC guidelines.   5. The patient or parent/caregiver has the option to accept or refuse COVID monoclonal antibody treatment.  After reviewing this information with the patient, the patient has agreed to receive one of the available covid 19 monoclonal antibodies  and will be provided an appropriate fact sheet prior to infusion. Kathrine Haddock, NP 11/15/2020 10:45 AM

## 2020-11-16 ENCOUNTER — Ambulatory Visit (HOSPITAL_COMMUNITY): Payer: No Typology Code available for payment source

## 2020-12-06 ENCOUNTER — Other Ambulatory Visit: Payer: Self-pay | Admitting: Family Medicine

## 2020-12-07 ENCOUNTER — Encounter: Payer: Self-pay | Admitting: Family Medicine

## 2020-12-07 DIAGNOSIS — R059 Cough, unspecified: Secondary | ICD-10-CM

## 2020-12-08 MED ORDER — PREDNISONE 20 MG PO TABS: 20.0000 mg | ORAL_TABLET | Freq: Every day | ORAL | 0 refills | Status: AC

## 2021-01-26 ENCOUNTER — Other Ambulatory Visit: Payer: Self-pay | Admitting: Family Medicine

## 2021-01-26 DIAGNOSIS — F411 Generalized anxiety disorder: Secondary | ICD-10-CM

## 2021-01-26 NOTE — Telephone Encounter (Signed)
Requested Prescriptions  Pending Prescriptions Disp Refills  . omeprazole (PRILOSEC) 20 MG capsule [Pharmacy Med Name: Omeprazole 20 MG Oral Capsule Delayed Release] 180 capsule 1    Sig: TAKE 1 CAPSULE BY MOUTH  TWICE DAILY WITH FOOD     Gastroenterology: Proton Pump Inhibitors Passed - 01/26/2021  9:46 PM      Passed - Valid encounter within last 12 months    Recent Outpatient Visits          2 months ago Viral upper respiratory tract infection   [facility] Jerrol Banana., MD   8 months ago Annual physical exam   [facility] Jerrol Banana., MD   1 year ago Intussusception, ileocecal [facility])   [facility] Jerrol Banana., MD   1 year ago Need for immunization against influenza   [facility] Jerrol Banana., MD   1 year ago Depression, unspecified depression type   [facility] Jerrol Banana., MD

## 2021-01-26 NOTE — Telephone Encounter (Signed)
Requested Prescriptions  Pending Prescriptions Disp Refills  . sertraline (ZOLOFT) 100 MG tablet [Pharmacy Med Name: Sertraline HCl 100 MG Oral Tablet] 180 tablet 1    Sig: TAKE 2 TABLETS BY MOUTH  DAILY     Psychiatry:  Antidepressants - SSRI Passed - 01/26/2021  9:25 PM      Passed - Completed PHQ-2 or PHQ-9 in the last 360 days      Passed - Valid encounter within last 6 months    Recent Outpatient Visits          2 months ago Viral upper respiratory tract infection   [facility] Jerrol Banana., MD   8 months ago Annual physical exam   [facility] Jerrol Banana., MD   1 year ago Intussusception, ileocecal [facility])   [facility] Jerrol Banana., MD   1 year ago Need for immunization against influenza   [facility] Jerrol Banana., MD   1 year ago Depression, unspecified depression type   [facility] Jerrol Banana., MD

## 2021-02-04 ENCOUNTER — Telehealth: Payer: Self-pay | Admitting: Family Medicine

## 2021-02-04 DIAGNOSIS — F411 Generalized anxiety disorder: Secondary | ICD-10-CM

## 2021-02-04 NOTE — Telephone Encounter (Signed)
Patient would like the nurse to call her regarding her medications.  She has changed pharmacy and it has been updated on the system, but patient would like to know what she has to do to have all meds refilled through her new pharmacy.  Please advise and call patient to discuss at [phone]

## 2021-02-04 NOTE — Telephone Encounter (Signed)
Returned call to pt.

## 2021-02-08 MED ORDER — BUSPIRONE HCL 10 MG PO TABS: 10.0000 mg | ORAL_TABLET | Freq: Two times a day (BID) | ORAL | 4 refills | Status: DC

## 2021-02-08 MED ORDER — SERTRALINE HCL 100 MG PO TABS: 2.0000 | ORAL_TABLET | Freq: Every day | ORAL | 1 refills | Status: DC

## 2021-02-08 MED ORDER — OMEPRAZOLE 20 MG PO CPDR: DELAYED_RELEASE_CAPSULE | ORAL | 1 refills | Status: DC

## 2021-02-08 MED ORDER — CELECOXIB 200 MG PO CAPS: 200.0000 mg | ORAL_CAPSULE | Freq: Every day | ORAL | 3 refills | Status: DC

## 2021-02-08 NOTE — Telephone Encounter (Signed)
Rx's sent to pharmacy.  

## 2021-04-21 ENCOUNTER — Encounter: Payer: Self-pay | Admitting: Family Medicine

## 2021-04-21 ENCOUNTER — Other Ambulatory Visit: Payer: Self-pay

## 2021-04-21 ENCOUNTER — Ambulatory Visit (INDEPENDENT_AMBULATORY_CARE_PROVIDER_SITE_OTHER): Payer: 59 | Admitting: Family Medicine

## 2021-04-21 VITALS — BP 143/75 | HR 68 | Temp 98.4°F | Resp 16 | Ht 73.0 in | Wt 279.0 lb

## 2021-04-21 DIAGNOSIS — F324 Major depressive disorder, single episode, in partial remission: Secondary | ICD-10-CM | POA: Diagnosis not present

## 2021-04-21 DIAGNOSIS — M1712 Unilateral primary osteoarthritis, left knee: Secondary | ICD-10-CM | POA: Diagnosis not present

## 2021-04-21 DIAGNOSIS — Z6837 Body mass index (BMI) 37.0-37.9, adult: Secondary | ICD-10-CM

## 2021-04-21 DIAGNOSIS — K219 Gastro-esophageal reflux disease without esophagitis: Secondary | ICD-10-CM | POA: Diagnosis not present

## 2021-04-21 DIAGNOSIS — E66812 Obesity, class 2: Secondary | ICD-10-CM

## 2021-04-21 DIAGNOSIS — I890 Lymphedema, not elsewhere classified: Secondary | ICD-10-CM

## 2021-04-21 NOTE — Progress Notes (Signed)
Established patient visit   Patient: Lisa Stein   DOB: Dec 07, 1960   60 y.o. Female  MRN: [id number] Visit Date: 04/21/2021  Today's healthcare provider: [name], MD   Chief Complaint  Patient presents with   Depression   Subjective    HPI  Patient comes in today for follow-up.  Her son just received the kidney transplant from her daughter.  This occurred 3 weeks ago. Overall patient is doing fairly well.  Is retired now. Chronic lymphedema and arthritis stable.  Depression, Follow-up  She  was last seen for this 11 months ago. Changes made at last visit include no medication changes.   She reports good compliance with treatment. She is not having side effects.   She reports good tolerance of treatment.  She feels she is Unchanged since last visit.  Depression screen [facility] 2/9 04/21/2021 05/12/2020 08/21/2018  Decreased Interest 0 0 0  Down, Depressed, Hopeless 0 1 0  PHQ - 2 Score 0 1 0  Altered sleeping 1 1 0  Tired, decreased energy 1 0 1  Change in appetite 0 0 0  Feeling bad or failure about yourself  0 1 0  Trouble concentrating 0 1 0  Moving slowly or fidgety/restless 0 0 0  Suicidal thoughts 0 0 0  PHQ-9 Score 2 4 1   Difficult doing work/chores Not difficult at all Not difficult at all Not difficult at all         Medications: Outpatient Medications Prior to Visit  Medication Sig   acetaminophen (TYLENOL) 500 MG tablet Take 1,000 mg by mouth every 6 (six) hours as needed for moderate pain or headache.   ALPRAZolam (XANAX) 0.5 MG tablet Take 1 tablet (0.5 mg total) by mouth 2 (two) times daily as needed for anxiety.   busPIRone (BUSPAR) 10 MG tablet Take 1 tablet (10 mg total) by mouth 2 (two) times daily.   celecoxib (CELEBREX) 200 MG capsule Take 1 capsule (200 mg total) by mouth daily.   cholecalciferol (VITAMIN D3) 25 MCG (1000 UNIT) tablet Take 3,000 Units by mouth daily.   diphenhydrAMINE (BENADRYL) 25 MG tablet Take 50 mg by  mouth at bedtime.   fluticasone (FLONASE) 50 MCG/ACT nasal spray Place 1 spray into the nose daily as needed for allergies.    gabapentin (NEURONTIN) 300 MG capsule Take 1 capsule (300 mg total) by mouth 3 (three) times daily.   omeprazole (PRILOSEC) 20 MG capsule TAKE 1 CAPSULE BY MOUTH  TWICE DAILY WITH FOOD   phenylephrine (SUDAFED PE) 10 MG TABS tablet Take 10 mg by mouth every 4 (four) hours as needed (congestion / sinuses).   sertraline (ZOLOFT) 100 MG tablet Take 2 tablets (200 mg total) by mouth daily.   estradiol (ESTRACE) 1 MG tablet TAKE 1 TABLET BY MOUTH  DAILY (Patient not taking: Reported on 04/21/2021)   methocarbamol (ROBAXIN) 500 MG tablet Take 1 tablet (500 mg total) by mouth every 6 (six) hours as needed for muscle spasms. (Patient not taking: Reported on 04/21/2021)   oxyCODONE 10 MG TABS Take 1-1.5 tablets (10-15 mg total) by mouth every 4 (four) hours as needed for severe pain (pain score 7-10). (Patient not taking: Reported on 04/21/2021)   Facility-Administered Medications Prior to Visit  Medication Dose Route Frequency Provider   methylPREDNISolone acetate (DEPO-MEDROL) injection 80 mg  80 mg Intramuscular Once Jerrol Banana., MD    Review of Systems  Constitutional:  Negative for activity change and  fatigue.  Respiratory:  Negative for cough and shortness of breath.   Cardiovascular:  Negative for chest pain, palpitations and leg swelling.  Endocrine: Negative for cold intolerance, heat intolerance, polydipsia, polyphagia and polyuria.  Musculoskeletal:  Negative for arthralgias and joint swelling.  Neurological:  Negative for dizziness, light-headedness and headaches.  Psychiatric/Behavioral:  Negative for agitation, self-injury, sleep disturbance and suicidal ideas. The patient is not nervous/anxious.        Objective    BP (!) 143/75   Pulse 68   Temp 98.4 F (36.9 C)   Resp 16   Ht 6\' 1"  (1.854 m)   Wt 279 lb (126.6 kg)   LMP  (LMP Unknown)   BMI  36.81 kg/m  BP Readings from Last 3 Encounters:  04/21/21 (!) 143/75  09/14/20 (!) 130/52  09/02/20 (!) 139/92   Wt Readings from Last 3 Encounters:  04/21/21 279 lb (126.6 kg)  09/02/20 282 lb 9.6 oz (128.2 kg)  08/04/20 280 lb 8 oz (127.2 kg)       Physical Exam Vitals reviewed.  Constitutional:      Appearance: Normal appearance.  HENT:     Head: Normocephalic and atraumatic.     Right Ear: External ear normal.     Left Ear: External ear normal.     Nose: Nose normal.  Eyes:     Conjunctiva/sclera: Conjunctivae normal.  Cardiovascular:     Rate and Rhythm: Normal rate and regular rhythm.     Pulses: Normal pulses.     Heart sounds: Normal heart sounds.  Pulmonary:     Breath sounds: Normal breath sounds.  Abdominal:     Palpations: Abdomen is soft.  Skin:    General: Skin is warm and dry.  Neurological:     Mental Status: She is alert and oriented to person, place, and time. Mental status is at baseline.  Psychiatric:        Mood and Affect: Mood normal.        Behavior: Behavior normal.        Thought Content: Thought content normal.        Judgment: Judgment normal.      No results found for any visits on 04/21/21.  Assessment & Plan     1. Gastroesophageal reflux disease, unspecified whether esophagitis present On omeprazole  2. Primary osteoarthritis of left knee Post TKR  3. Major depressive disorder with single episode, in partial remission (HCC) Sertraline 200 mg daily  4. Class 2 severe obesity due to excess calories with serious comorbidity and body mass index (BMI) of 37.0 to 37.9 in adult [facility]) Exercise discussed.  5. Acquired lymphedema  '  No follow-ups on file.      I, [name], MD, have reviewed all documentation for this visit. The documentation on 04/25/21 for the exam, diagnosis, procedures, and orders are all accurate and complete.    Angalina Ante Cranford Mon, MD  [facility] [phone]  (phone) [phone] (fax)  [place]

## 2021-08-11 ENCOUNTER — Other Ambulatory Visit: Payer: Self-pay | Admitting: Family Medicine

## 2021-08-11 DIAGNOSIS — F411 Generalized anxiety disorder: Secondary | ICD-10-CM

## 2021-10-05 ENCOUNTER — Encounter: Payer: Self-pay | Admitting: Family Medicine

## 2021-10-06 MED ORDER — BUSPIRONE HCL 10 MG PO TABS: 10.0000 mg | ORAL_TABLET | Freq: Two times a day (BID) | ORAL | 1 refills | Status: DC

## 2021-10-08 ENCOUNTER — Other Ambulatory Visit: Payer: Self-pay | Admitting: Family Medicine

## 2021-10-08 NOTE — Telephone Encounter (Signed)
I called [facility] [phone] spoke with pharmacist regarding a duplicate refill request for the Buspar.   Disregard the duplicate request.   The 30 day supply is ready for the pt to pick up.   So I refused the duplicate request.

## 2021-10-08 NOTE — Telephone Encounter (Signed)
Medication Refill - Medication: Pt needs a 30 day supply of busPIRone (BUSPAR) 10 MG tablet  Has the patient contacted their pharmacy? Yes.   (Agent: If no, request that the patient contact the pharmacy for the refill. If patient does not wish to contact the pharmacy document the reason why and proceed with request.) (Agent: If yes, when and what did the pharmacy advise?)  Preferred Pharmacy (with phone number or street name):  [id number] [place], [address]  [address] [place] [address]  Phone: [phone] Fax: [phone]   Has the patient been seen for an appointment in the last year OR does the patient have an upcoming appointment? Yes.    Agent: Please be advised that RX refills may take up to 3 business days. We ask that you follow-up with your pharmacy.

## 2021-10-14 ENCOUNTER — Encounter: Payer: Self-pay | Admitting: Family Medicine

## 2021-10-18 ENCOUNTER — Other Ambulatory Visit: Payer: Self-pay | Admitting: Family Medicine

## 2021-10-18 ENCOUNTER — Other Ambulatory Visit: Payer: Self-pay

## 2021-10-18 ENCOUNTER — Other Ambulatory Visit (HOSPITAL_COMMUNITY)
Admission: RE | Admit: 2021-10-18 | Discharge: 2021-10-18 | Disposition: A | Discharge status: Home / Self Care | Payer: 59 | Source: Ambulatory Visit | Attending: Family Medicine | Admitting: Family Medicine

## 2021-10-18 ENCOUNTER — Encounter: Payer: Self-pay | Admitting: Family Medicine

## 2021-10-18 ENCOUNTER — Ambulatory Visit (INDEPENDENT_AMBULATORY_CARE_PROVIDER_SITE_OTHER): Payer: 59 | Admitting: Family Medicine

## 2021-10-18 VITALS — BP 115/76 | HR 73 | Temp 98.2°F | Resp 16 | Ht 73.0 in | Wt 277.0 lb

## 2021-10-18 DIAGNOSIS — K219 Gastro-esophageal reflux disease without esophagitis: Secondary | ICD-10-CM

## 2021-10-18 DIAGNOSIS — R200 Anesthesia of skin: Secondary | ICD-10-CM

## 2021-10-18 DIAGNOSIS — F324 Major depressive disorder, single episode, in partial remission: Secondary | ICD-10-CM

## 2021-10-18 DIAGNOSIS — F411 Generalized anxiety disorder: Secondary | ICD-10-CM

## 2021-10-18 DIAGNOSIS — M1710 Unilateral primary osteoarthritis, unspecified knee: Secondary | ICD-10-CM

## 2021-10-18 DIAGNOSIS — E559 Vitamin D deficiency, unspecified: Secondary | ICD-10-CM

## 2021-10-18 DIAGNOSIS — Z0000 Encounter for general adult medical examination without abnormal findings: Secondary | ICD-10-CM | POA: Diagnosis present

## 2021-10-18 DIAGNOSIS — M5116 Intervertebral disc disorders with radiculopathy, lumbar region: Secondary | ICD-10-CM

## 2021-10-18 DIAGNOSIS — R202 Paresthesia of skin: Secondary | ICD-10-CM

## 2021-10-18 DIAGNOSIS — Z6837 Body mass index (BMI) 37.0-37.9, adult: Secondary | ICD-10-CM

## 2021-10-18 DIAGNOSIS — Z1231 Encounter for screening mammogram for malignant neoplasm of breast: Secondary | ICD-10-CM

## 2021-10-18 MED ORDER — CELECOXIB 200 MG PO CAPS: 200.0000 mg | ORAL_CAPSULE | Freq: Every day | ORAL | 3 refills | Status: DC

## 2021-10-18 MED ORDER — BUSPIRONE HCL 10 MG PO TABS: 10.0000 mg | ORAL_TABLET | Freq: Two times a day (BID) | ORAL | 1 refills | Status: DC

## 2021-10-18 MED ORDER — SERTRALINE HCL 100 MG PO TABS: 200.0000 mg | ORAL_TABLET | Freq: Every day | ORAL | 3 refills | Status: DC

## 2021-10-18 MED ORDER — GABAPENTIN 300 MG PO CAPS: 300.0000 mg | ORAL_CAPSULE | Freq: Three times a day (TID) | ORAL | 2 refills | Status: DC

## 2021-10-18 MED ORDER — OMEPRAZOLE 20 MG PO CPDR: DELAYED_RELEASE_CAPSULE | ORAL | 3 refills | Status: DC

## 2021-10-18 NOTE — Progress Notes (Signed)
BP 115/76 (BP Location: Left Arm, Patient Position: Sitting, Cuff Size: Large)    Pulse 73    Temp 98.2 F (36.8 C) (Temporal)    Resp 16    Ht 6\' 1"  (1.854 m)    Wt 277 lb (125.6 kg)    LMP  (LMP Unknown)    SpO2 94%    BMI 36.55 kg/m    Subjective:    Patient ID: Lisa Stein, female    DOB: 10-22-1960, 61 y.o.   MRN: [id number]  HPI: Lisa Stein is a 62 y.o. female presenting on 10/18/2021 for comprehensive medical examination. Current medical complaints include:none  Anxiety/Depression - Medications: xanax prn, buspar, zoloft - Taking: good compliance. Taking xanax rarely, last took 2-3 months ago.  - Counseling: not currently. Getting set up with grief counseling. - Previous hospitalizations: no - Symptoms: none - Current stressors: son with recent kidney transplant, daughter with anxiety, grandchild recently passed away  Arthritis - s/p L TKR. celebrex prn. Gabapentin for peripheral neuropathy.   GERD - Meds: omeprazole BID - Symptoms:  heartburn.  - denies dysphagia has not lost weight denies melena, hematochezia, hematemesis, and coffee ground emesis.    She currently lives with: husband, daugher Menopausal Symptoms: no  Depression Screen done today and results listed below:  Depression screen [facility] 2/9 10/18/2021 04/21/2021 05/12/2020 08/21/2018 10/16/2017  Decreased Interest 0 0 0 0 0  Down, Depressed, Hopeless 1 0 1 0 0  PHQ - 2 Score 1 0 1 0 0  Altered sleeping 1 1 1  0 0  Tired, decreased energy 0 1 0 1 1  Change in appetite 0 0 0 0 0  Feeling bad or failure about yourself  0 0 1 0 0  Trouble concentrating 0 0 1 0 0  Moving slowly or fidgety/restless 0 0 0 0 0  Suicidal thoughts 0 0 0 0 0  PHQ-9 Score 2 2 4 1 1   Difficult doing work/chores Not difficult at all Not difficult at all Not difficult at all Not difficult at all Not difficult at all    The patient does not have a history of falls. I did not complete a risk assessment for falls. A plan of care for  falls was not documented.   Past Medical History:  Past Medical History:  Diagnosis Date   Anxiety    Community acquired pneumonia 06/23/1940   Complication of anesthesia    difficulty waking up after back surgery. o2 sats dropped low.   Depression    Diarrhea 08/05/2014   Difficult or painful urination 03/16/2015   Fatigue 03/16/2015   GERD (gastroesophageal reflux disease)    Heart murmur    no treatment   History of kidney stones    Lower abdominal pain 08/05/2014   Lymphedema    left ankle. 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   COLONOSCOPY  2010   Dr. Vira Agar   COLONOSCOPY  08-07-14   Dr Bary Castilla   EXTRACORPOREAL SHOCK WAVE LITHOTRIPSY Right 04/26/2018   Procedure: EXTRACORPOREAL SHOCK  WAVE LITHOTRIPSY (ESWL);  Surgeon: Hollice Espy, MD;  Location: [facility] ORS;  Service: Urology;  Laterality: Right;   HEMICOLECTOMY  08/08/14   JOINT REPLACEMENT     REFRACTIVE SURGERY Left    REPLACEMENT TOTAL KNEE Left 2009   sinus problem  2004   Sinuses opened up   [place]  08/02/2016   spinal fusion - [facility]   TOOTH EXTRACTION Right 2016   TOTAL KNEE REVISION Left 09/10/2020   Procedure: Left total knee arthroplasty revision of tibial poly, possible patella Rachelle Hora to Assist;  Surgeon: Hessie Knows, MD;  Location: [facility] ORS;  Service: Orthopedics;  Laterality: Left;  Rachelle Hora to Assist   TOTAL SHOULDER REPLACEMENT Left 2008,2009    Medications:  Current Outpatient Medications on File Prior to Visit  Medication Sig   acetaminophen (TYLENOL) 500 MG tablet Take 1,000  mg by mouth every 6 (six) hours as needed for moderate pain or headache.   ALPRAZolam (XANAX) 0.5 MG tablet Take 1 tablet (0.5 mg total) by mouth 2 (two) times daily as needed for anxiety.   Cholecalciferol (VITAMIN D3) 125 MCG (5000 UT) CAPS Take by mouth daily.   diphenhydrAMINE (BENADRYL) 25 MG tablet Take 50 mg by mouth at bedtime.   fluticasone (FLONASE) 50 MCG/ACT nasal spray Place 1 spray into the nose daily as needed for allergies.    phenylephrine (SUDAFED PE) 10 MG TABS tablet Take 10 mg by mouth every 4 (four) hours as needed (congestion / sinuses).   Current Facility-Administered Medications on File Prior to Visit  Medication   methylPREDNISolone acetate (DEPO-MEDROL) injection 80 mg    Allergies:  Allergies  Allergen Reactions   Pregabalin Other (See Comments)    Skin crawling   Sulfa Antibiotics Itching   Tegretol [Carbamazepine] Itching   Amitriptyline Swelling    SWELLING IN HANDS   Benzoin Dermatitis    Blisters.    Betadine  [Povidone Iodine] Rash   Biaxin [Clarithromycin] Diarrhea    GI upset    Social History:  Social History   Socioeconomic History   Marital status: Married    Spouse name: way ne   Number of children: Not on file   Years of education: Not on file   Highest education level: Not on file  Occupational History   Occupation: Chiropractor  Tobacco Use   Smoking status: Former    Years: 5.00    Types: Cigarettes   Smokeless tobacco: Never   Tobacco comments:    Quit smoking in 1980's  Vaping Use   Vaping Use: Never used  Substance and Sexual Activity   Alcohol use: Yes    Alcohol/week: 1.0 standard drink    Types: 1 Glasses of wine per week    Comment: occasional   Drug use: No   Sexual activity: Not on file  Other Topics Concern   Not on file  Social History Narrative   Lives at home with husband and family. Independent at baseline.   Many family members reside within the home.   Patient works from home.   Social  Determinants of Health   Financial Resource Strain: Not on file  Food Insecurity: Not on file  Transportation Needs: Not on file  Physical Activity: Not on file  Stress: Not on file  Social Connections: Not on file  Intimate Partner Violence: Not on file   Social History   Tobacco Use  Smoking Status Former   Years: 5.00   Types: Cigarettes  Smokeless Tobacco Never  Tobacco Comments   Quit smoking in 1980's   Social History   Substance and Sexual Activity  Alcohol Use Yes   Alcohol/week: 1.0 standard drink   Types: 1 Glasses of wine per week   Comment: occasional    Family History:  Family History  Problem Relation Age of Onset   Hypertension Mother    Arthritis Mother    COPD Mother    Raynaud syndrome Mother    Kidney failure Mother    Heart failure Mother    Cancer Father        lung & liver cancer   Raynaud syndrome Sister    Raynaud syndrome Sister    Breast cancer Neg Hx     Past medical history, surgical history, medications, allergies, family history and social history reviewed with patient today and changes made to appropriate areas of the chart.      Objective:    BP 115/76 (BP Location: Left Arm, Patient Position: Sitting, Cuff Size: Large)    Pulse 73    Temp 98.2 F (36.8 C) (Temporal)    Resp 16    Ht 6\' 1"  (1.854 m)    Wt 277 lb (125.6 kg)    LMP  (LMP Unknown)    SpO2 94%    BMI 36.55 kg/m   Wt Readings from Last 3 Encounters:  10/18/21 277 lb (125.6 kg)  04/21/21 279 lb (126.6 kg)  09/02/20 282 lb 9.6 oz (128.2 kg)    Physical Exam Vitals reviewed. Exam conducted with a chaperone present.  Constitutional:      Appearance: She is obese.  HENT:     Head: Normocephalic and atraumatic.     Right Ear: External ear normal.     Left Ear: External ear normal.     Nose: Nose normal.     Mouth/Throat:     Mouth: Mucous membranes are moist.     Pharynx: Oropharynx is clear.  Eyes:     Extraocular Movements: Extraocular movements intact.      Pupils: Pupils are equal, round, and reactive to light.  Cardiovascular:     Rate and Rhythm: Normal rate and regular rhythm.     Heart sounds: Normal heart sounds. No murmur heard. Pulmonary:     Effort: Pulmonary effort is normal. No respiratory distress.     Breath sounds: Normal breath sounds.  Abdominal:     General: Bowel sounds are normal.     Palpations: Abdomen is soft.     Tenderness: There is no abdominal tenderness.  Genitourinary:    General: Normal vulva.     Vagina: Normal. No vaginal discharge.     Cervix: No cervical motion tenderness.     Uterus: Absent.      Adnexa: Right adnexa normal and left adnexa normal.  Musculoskeletal:     Cervical back: Normal range of motion.     Right lower leg: No edema.     Left lower leg: No edema.  Lymphadenopathy:     Cervical: No cervical adenopathy.     Lower Body: No right inguinal adenopathy. No left inguinal adenopathy.  Skin:    General: Skin is warm and dry.  Neurological:     Mental Status: She is alert and oriented to person, place, and time. Mental status is at baseline.     Gait: Gait normal.  Psychiatric:        Mood and Affect: Mood normal.        Behavior: Behavior normal.  Results for orders placed or performed in visit on 11/12/20  Novel Coronavirus, NAA ([facility])   Specimen: Nasopharyngeal(NP) swabs in vial transport medium   Nasopharynge  Result Value Ref Range   SARS-CoV-2, NAA Detected (A) Not Detected  SARS-COV-2, NAA 2 DAY TAT   Nasopharynge  Result Value Ref Range   SARS-CoV-2, NAA 2 DAY TAT Performed       Assessment & Plan:   Problem List Items Addressed This Visit       Digestive   Acid reflux    Doing well on current regimen, no changes made today.      Relevant Medications   omeprazole (PRILOSEC) 20 MG capsule   Other Relevant Orders   Comprehensive metabolic panel     Nervous and Auditory   Neuritis or radiculitis due to rupture of lumbar intervertebral disc   Relevant  Medications   gabapentin (NEURONTIN) 300 MG capsule     Musculoskeletal and Integument   Arthritis, degenerative   Relevant Medications   celecoxib (CELEBREX) 200 MG capsule     Other   Adiposity   Relevant Orders   Comprehensive metabolic panel   Hemoglobin A1c   Lipid panel   Anxiety, generalized   Relevant Medications   sertraline (ZOLOFT) 100 MG tablet   busPIRone (BUSPAR) 10 MG tablet   Clinical depression   Relevant Medications   sertraline (ZOLOFT) 100 MG tablet   busPIRone (BUSPAR) 10 MG tablet   Numbness and tingling of foot   Other Visit Diagnoses     Annual physical exam    -  Primary   Relevant Orders   Comprehensive metabolic panel   CBC   Hepatitis C antibody   Hemoglobin A1c   Lipid panel   DG Bone Density   Lipid panel   Cytology - PAP   Vitamin D deficiency       Relevant Medications   Cholecalciferol (VITAMIN D3) 125 MCG (5000 UT) CAPS        Follow up plan: Return in about 6 months (around 04/17/2022).   LABORATORY TESTING:  - Pap smear: pap done  IMMUNIZATIONS:   - Tdap: Tetanus vaccination status reviewed: last tetanus booster within 10 years. - Influenza: Refused - Pneumovax: Not applicable - Prevnar: Not applicable - HPV: Not applicable - Shingrix vaccine: Refused - COVID vaccine: refused  SCREENING: - Mammogram: Up to date  - Colonoscopy: Up to date  - Bone Density: Ordered today  - Lung Cancer Screening: Not applicable   Hep C Screening: due STD testing and prevention (HIV/chl/gon/syphilis): done today Sexual History : Menstrual History/LMP/Abnormal Bleeding: s/p hysterectomy Incontinence Symptoms: none  Osteoporosis: Discussed high calcium and vitamin D supplementation, weight bearing exercises  Advanced Care Planning: A voluntary discussion about advance care planning including the explanation and discussion of advance directives.  Discussed health care proxy and Living will, and the patient was able to identify a  health care proxy as husband, Vianca Bracher.  Patient does have a living will at present time. If patient does have living will, I have requested they bring this to the clinic to be scanned in to their chart.  PATIENT COUNSELING:   Advised to take 1 mg of folate supplement per day if capable of pregnancy.   Sexuality: Discussed sexually transmitted diseases, partner selection, use of condoms, avoidance of unintended pregnancy  and contraceptive alternatives.   Advised to avoid cigarette smoking.  I discussed with the patient that most people either abstain from alcohol or drink  within safe limits (<=14/week and <=4 drinks/occasion for males, <=7/weeks and <= 3 drinks/occasion for females) and that the risk for alcohol disorders and other health effects rises proportionally with the number of drinks per week and how often a drinker exceeds daily limits.  Discussed cessation/primary prevention of drug use and availability of treatment for abuse.   Diet: Encouraged to adjust caloric intake to maintain  or achieve ideal body weight, to reduce intake of dietary saturated fat and total fat, to limit sodium intake by avoiding high sodium foods and not adding table salt, and to maintain adequate dietary potassium and calcium preferably from fresh fruits, vegetables, and low-fat dairy products.    stressed the importance of regular exercise  Injury prevention: Discussed safety belts, safety helmets, smoke detector, smoking near bedding or upholstery.   Dental health: Discussed importance of regular tooth brushing, flossing, and dental visits.    NEXT PREVENTATIVE PHYSICAL DUE IN 1 YEAR. Return in about 6 months (around 04/17/2022).

## 2021-10-18 NOTE — Assessment & Plan Note (Signed)
Doing well on current regimen, no changes made today. 

## 2021-10-19 LAB — LIPID PANEL
Chol/HDL Ratio: 3.4 ratio (ref 0.0–4.4)
Cholesterol, Total: 282 mg/dL — ABNORMAL HIGH (ref 100–199)
HDL: 84 mg/dL (ref >39)
LDL Chol Calc (NIH): 177 mg/dL — ABNORMAL HIGH (ref 0–99)
Triglycerides: 124 mg/dL (ref 0–149)
VLDL Cholesterol Cal: 21 mg/dL (ref 5–40)

## 2021-10-19 LAB — CBC
Hematocrit: 43.8 % (ref 34.0–46.6)
Hemoglobin: 13.6 g/dL (ref 11.1–15.9)
MCH: 26.2 pg — ABNORMAL LOW (ref 26.6–33.0)
MCHC: 31.1 g/dL — ABNORMAL LOW (ref 31.5–35.7)
MCV: 84 fL (ref 79–97)
Platelets: 236 10*3/uL (ref 150–450)
RBC: 5.2 x10E6/uL (ref 3.77–5.28)
RDW: 13.7 % (ref 11.7–15.4)
WBC: 4.6 10*3/uL (ref 3.4–10.8)

## 2021-10-19 LAB — COMPREHENSIVE METABOLIC PANEL
ALT: 19 IU/L (ref 0–32)
AST: 25 IU/L (ref 0–40)
Albumin/Globulin Ratio: 2 (ref 1.2–2.2)
Albumin: 4.5 g/dL (ref 3.8–4.8)
Alkaline Phosphatase: 76 IU/L (ref 44–121)
BUN/Creatinine Ratio: 19 (ref 12–28)
BUN: 16 mg/dL (ref 8–27)
Bilirubin Total: 0.3 mg/dL (ref 0.0–1.2)
CO2: 26 mmol/L (ref 20–29)
Calcium: 9.3 mg/dL (ref 8.7–10.3)
Chloride: 101 mmol/L (ref 96–106)
Creatinine, Ser: 0.86 mg/dL (ref 0.57–1.00)
Globulin, Total: 2.2 g/dL (ref 1.5–4.5)
Glucose: 85 mg/dL (ref 70–99)
Potassium: 4.4 mmol/L (ref 3.5–5.2)
Sodium: 143 mmol/L (ref 134–144)
Total Protein: 6.7 g/dL (ref 6.0–8.5)
eGFR: 76 mL/min/{1.73_m2} (ref >59)

## 2021-10-19 LAB — HEMOGLOBIN A1C
Est. average glucose Bld gHb Est-mCnc: 120 mg/dL
Hgb A1c MFr Bld: 5.8 % — ABNORMAL HIGH (ref 4.8–5.6)

## 2021-10-19 LAB — HEPATITIS C ANTIBODY: Hep C Virus Ab: <0.1 s/co ratio (ref 0.0–0.9)

## 2021-10-21 LAB — CYTOLOGY - PAP
Chlamydia: NEGATIVE
Comment: NEGATIVE
Comment: NEGATIVE
Comment: NEGATIVE
Comment: NORMAL
Diagnosis: NEGATIVE
High risk HPV: NEGATIVE
Neisseria Gonorrhea: NEGATIVE
Trichomonas: NEGATIVE

## 2021-11-23 ENCOUNTER — Other Ambulatory Visit: Payer: No Typology Code available for payment source

## 2022-01-06 ENCOUNTER — Ambulatory Visit
Admission: RE | Admit: 2022-01-06 | Discharge: 2022-01-06 | Disposition: A | Discharge status: Home / Self Care | Payer: 59 | Source: Ambulatory Visit | Attending: Family Medicine | Admitting: Family Medicine

## 2022-01-06 DIAGNOSIS — Z1231 Encounter for screening mammogram for malignant neoplasm of breast: Secondary | ICD-10-CM | POA: Diagnosis present

## 2022-01-06 DIAGNOSIS — Z0000 Encounter for general adult medical examination without abnormal findings: Secondary | ICD-10-CM | POA: Diagnosis present

## 2022-02-08 ENCOUNTER — Encounter: Payer: Self-pay | Admitting: Family Medicine

## 2022-02-18 ENCOUNTER — Telehealth: Payer: Self-pay | Admitting: Family Medicine

## 2022-02-18 NOTE — Telephone Encounter (Signed)
Pt states her insurance will only pay for one  omeprazole (PRILOSEC) 20 MG capsule/ day  Pt has been on this 2 for years.  Pt has new insurance, [employer].  Pt would like your assistance with a prior auth.   [place], [place] mail services

## 2022-02-21 ENCOUNTER — Other Ambulatory Visit: Payer: Self-pay | Admitting: Family Medicine

## 2022-02-21 NOTE — Telephone Encounter (Signed)
PA was started today. Waiting on results.

## 2022-02-22 NOTE — Telephone Encounter (Signed)
Requested medications are due for refill today.  no  Requested medications are on the active medications list.  yes  Last refill. 10/18/2021 #180 3 refills  Future visit scheduled.   yes Pharmacy comment: insurance only allowing once a day for 30 days. Notes to clinic.  Note from Pharmacy -     Requested Prescriptions  Pending Prescriptions Disp Refills   omeprazole (PRILOSEC) 20 MG capsule [Pharmacy Med Name: OMEPRAZOLE '20MG'$  CAP] 180 capsule 3    Sig: TAKE 1 CAPSULE BY MOUTH  TWICE DAILY WITH FOOD     Gastroenterology: Proton Pump Inhibitors Passed - 02/21/2022 12:22 PM      Passed - Valid encounter within last 12 months    Recent Outpatient Visits           4 months ago Annual physical exam   [place], DO   10 months ago Gastroesophageal reflux disease, unspecified whether esophagitis present   [facility] Jerrol Banana., MD   1 year ago Viral upper respiratory tract infection   [facility] Jerrol Banana., MD   1 year ago Annual physical exam   [facility] Jerrol Banana., MD   2 years ago Intussusception, ileocecal [facility])   [facility] Jerrol Banana., MD       Future Appointments             In 1 month Jerrol Banana., MD [facility], [place]   In 7 months Jerrol Banana., MD [facility], [place]

## 2022-02-23 NOTE — Telephone Encounter (Signed)
PA was approved. 

## 2022-03-10 ENCOUNTER — Encounter: Payer: Self-pay | Admitting: Family Medicine

## 2022-03-10 ENCOUNTER — Ambulatory Visit (INDEPENDENT_AMBULATORY_CARE_PROVIDER_SITE_OTHER): Payer: 59 | Admitting: Family Medicine

## 2022-03-10 VITALS — BP 130/69 | HR 73 | Resp 16 | Ht 73.0 in | Wt 286.0 lb

## 2022-03-10 DIAGNOSIS — I889 Nonspecific lymphadenitis, unspecified: Secondary | ICD-10-CM

## 2022-03-10 DIAGNOSIS — M542 Cervicalgia: Secondary | ICD-10-CM

## 2022-03-10 MED ORDER — AMOXICILLIN 500 MG PO CAPS: 500.0000 mg | ORAL_CAPSULE | Freq: Three times a day (TID) | ORAL | 0 refills | Status: DC

## 2022-03-10 NOTE — Progress Notes (Signed)
Established patient visit  I,[name],acting as a scribe for [name], MD.,have documented all relevant documentation on the behalf of [name], MD,as directed by  [name], MD while in the presence of [name], MD.   Patient: Lisa Stein   DOB: Dec 26, 1960   61 y.o. Female  MRN: [id number] Visit Date: 03/10/2022  Today's healthcare provider: [name], MD   Chief Complaint  Patient presents with   Adenopathy   Subjective    HPI Patient has a lymph node under her left ear that has hurting since yesterday. No other symptoms. She is worried that it is her carotid artery.  Medications: Outpatient Medications Prior to Visit  Medication Sig   acetaminophen (TYLENOL) 500 MG tablet Take 1,000 mg by mouth every 6 (six) hours as needed for moderate pain or headache.   ALPRAZolam (XANAX) 0.5 MG tablet Take 1 tablet (0.5 mg total) by mouth 2 (two) times daily as needed for anxiety.   busPIRone (BUSPAR) 10 MG tablet Take 1 tablet (10 mg total) by mouth 2 (two) times daily.   celecoxib (CELEBREX) 200 MG capsule Take 1 capsule (200 mg total) by mouth daily.   Cholecalciferol (VITAMIN D3) 125 MCG (5000 UT) CAPS Take by mouth daily.   diphenhydrAMINE (BENADRYL) 25 MG tablet Take 50 mg by mouth at bedtime.   fluticasone (FLONASE) 50 MCG/ACT nasal spray Place 1 spray into the nose daily as needed for allergies.    gabapentin (NEURONTIN) 300 MG capsule Take 1 capsule (300 mg total) by mouth 3 (three) times daily. (Patient taking differently: Take 300 mg by mouth 4 (four) times daily.)   omeprazole (PRILOSEC) 20 MG capsule TAKE 1 CAPSULE BY MOUTH TWICE DAILY WITH FOOD   phenylephrine (SUDAFED PE) 10 MG TABS tablet Take 10 mg by mouth every 4 (four) hours as needed (congestion / sinuses).   sertraline (ZOLOFT) 100 MG tablet Take 2 tablets (200 mg total) by mouth daily.   Facility-Administered Medications Prior to Visit  Medication Dose  Route Frequency Provider   methylPREDNISolone acetate (DEPO-MEDROL) injection 80 mg  80 mg Intramuscular Once Jerrol Banana., MD    Review of Systems  Constitutional:  Negative for appetite change, chills, fatigue and fever.  Respiratory:  Negative for chest tightness and shortness of breath.   Cardiovascular:  Negative for chest pain and palpitations.  Gastrointestinal:  Negative for abdominal pain, nausea and vomiting.  Neurological:  Negative for dizziness and weakness.       Objective    BP 130/69 (BP Location: Left Arm, Patient Position: Sitting, Cuff Size: Large)   Pulse 73   Resp 16   Ht 6' 1"  (1.854 m)   Wt 286 lb (129.7 kg)   LMP  (LMP Unknown)   SpO2 94%   BMI 37.73 kg/m    Physical Exam Vitals reviewed.  Constitutional:      General: She is not in acute distress.    Appearance: She is well-developed.  HENT:     Head: Normocephalic and atraumatic.     Right Ear: Hearing and tympanic membrane normal.     Left Ear: Hearing and tympanic membrane normal.     Nose: Nose normal.     Mouth/Throat:     Pharynx: Oropharynx is clear.  Eyes:     General: Lids are normal. No scleral icterus.       Right eye: No discharge.  Left eye: No discharge.     Conjunctiva/sclera: Conjunctivae normal.  Neck:     Comments: He has a mildly and large but moderately tender left submandibular lymph node. Cardiovascular:     Rate and Rhythm: Normal rate and regular rhythm.     Heart sounds: Normal heart sounds.  Pulmonary:     Effort: Pulmonary effort is normal. No respiratory distress.  Lymphadenopathy:     Cervical: Cervical adenopathy present.  Skin:    Findings: No lesion or rash.  Neurological:     General: No focal deficit present.     Mental Status: She is alert and oriented to person, place, and time.  Psychiatric:        Mood and Affect: Mood normal.        Speech: Speech normal.        Behavior: Behavior normal.        Thought Content: Thought content  normal.        Judgment: Judgment normal.       No results found for any visits on 03/10/22.  Assessment & Plan     1. Neck pain Follow-up in 1 week if not improved must consider carotidynia. - CBC w/Diff/Platelet - Sed Rate (ESR) - C-reactive protein  2. Lymphadenitis  - CBC w/Diff/Platelet - Sed Rate (ESR) - C-reactive protein - amoxicillin (AMOXIL) 500 MG capsule; Take 1 capsule (500 mg total) by mouth 3 (three) times daily.  Dispense: 15 capsule; Refill: 0   Return in about 1 week (around 03/17/2022).      I, [name], MD, have reviewed all documentation for this visit. The documentation on 03/20/22 for the exam, diagnosis, procedures, and orders are all accurate and complete.    Spiro Ausborn Cranford Mon, MD  [facility] [phone] (phone) [phone] (fax)  [place]

## 2022-03-12 LAB — CBC WITH DIFFERENTIAL/PLATELET
Basophils Absolute: 0 10*3/uL (ref 0.0–0.2)
Basos: 0 %
EOS (ABSOLUTE): 0.1 10*3/uL (ref 0.0–0.4)
Eos: 3 %
Hematocrit: 41.2 % (ref 34.0–46.6)
Hemoglobin: 13.5 g/dL (ref 11.1–15.9)
Immature Grans (Abs): 0 10*3/uL (ref 0.0–0.1)
Immature Granulocytes: 0 %
Lymphocytes Absolute: 1.6 10*3/uL (ref 0.7–3.1)
Lymphs: 39 %
MCH: 26.9 pg (ref 26.6–33.0)
MCHC: 32.8 g/dL (ref 31.5–35.7)
MCV: 82 fL (ref 79–97)
Monocytes Absolute: 0.3 10*3/uL (ref 0.1–0.9)
Monocytes: 8 %
Neutrophils Absolute: 2 10*3/uL (ref 1.4–7.0)
Neutrophils: 50 %
Platelets: 245 10*3/uL (ref 150–450)
RBC: 5.01 x10E6/uL (ref 3.77–5.28)
RDW: 13.3 % (ref 11.7–15.4)
WBC: 4 10*3/uL (ref 3.4–10.8)

## 2022-03-12 LAB — C-REACTIVE PROTEIN: CRP: 3 mg/L (ref 0–10)

## 2022-03-12 LAB — SEDIMENTATION RATE: Sed Rate: 11 mm/hr (ref 0–40)

## 2022-03-17 ENCOUNTER — Encounter: Payer: Self-pay | Admitting: Family Medicine

## 2022-03-17 ENCOUNTER — Ambulatory Visit (INDEPENDENT_AMBULATORY_CARE_PROVIDER_SITE_OTHER): Payer: 59 | Admitting: Family Medicine

## 2022-03-17 VITALS — BP 117/80 | HR 90 | Resp 16

## 2022-03-17 DIAGNOSIS — F324 Major depressive disorder, single episode, in partial remission: Secondary | ICD-10-CM

## 2022-03-17 DIAGNOSIS — F411 Generalized anxiety disorder: Secondary | ICD-10-CM

## 2022-03-17 DIAGNOSIS — I889 Nonspecific lymphadenitis, unspecified: Secondary | ICD-10-CM

## 2022-03-17 DIAGNOSIS — M542 Cervicalgia: Secondary | ICD-10-CM | POA: Diagnosis not present

## 2022-03-17 DIAGNOSIS — Z6837 Body mass index (BMI) 37.0-37.9, adult: Secondary | ICD-10-CM

## 2022-03-17 NOTE — Progress Notes (Unsigned)
      Established patient visit  I,Floride Hutmacher,acting as a scribe for Lisa Durie, MD.,have documented all relevant documentation on the behalf of Lisa Durie, MD,as directed by  Lisa Durie, MD while in the presence of Lisa Durie, MD.   Patient: LASHAWNA Stein   DOB: 01-21-1961   61 y.o. Female  MRN: [id number] Visit Date: 03/17/2022  Today's healthcare provider: [name], MD   Chief Complaint  Patient presents with   Follow-up   Neck Pain   Subjective    HPI  Neck Pain  Patient is a 61 year old female who presents for follow up of her neck pain.  She was last seen 1 week ago.    Medications: Outpatient Medications Prior to Visit  Medication Sig   acetaminophen (TYLENOL) 500 MG tablet Take 1,000 mg by mouth every 6 (six) hours as needed for moderate pain or headache.   ALPRAZolam (XANAX) 0.5 MG tablet Take 1 tablet (0.5 mg total) by mouth 2 (two) times daily as needed for anxiety.   busPIRone (BUSPAR) 10 MG tablet Take 1 tablet (10 mg total) by mouth 2 (two) times daily.   celecoxib (CELEBREX) 200 MG capsule Take 1 capsule (200 mg total) by mouth daily.   Cholecalciferol (VITAMIN D3) 125 MCG (5000 UT) CAPS Take by mouth daily.   diphenhydrAMINE (BENADRYL) 25 MG tablet Take 50 mg by mouth at bedtime.   fluticasone (FLONASE) 50 MCG/ACT nasal spray Place 1 spray into the nose daily as needed for allergies.    gabapentin (NEURONTIN) 300 MG capsule Take 1 capsule (300 mg total) by mouth 3 (three) times daily.   omeprazole (PRILOSEC) 20 MG capsule TAKE 1 CAPSULE BY MOUTH TWICE DAILY WITH FOOD   phenylephrine (SUDAFED PE) 10 MG TABS tablet Take 10 mg by mouth every 4 (four) hours as needed (congestion / sinuses).   sertraline (ZOLOFT) 100 MG tablet Take 2 tablets (200 mg total) by mouth daily.   [DISCONTINUED] amoxicillin (AMOXIL) 500 MG capsule Take 1 capsule (500 mg total) by mouth 3 (three) times daily. (Patient not taking: Reported on  03/17/2022)   Facility-Administered Medications Prior to Visit  Medication Dose Route Frequency Provider   methylPREDNISolone acetate (DEPO-MEDROL) injection 80 mg  80 mg Intramuscular Once Jerrol Banana., MD    Review of Systems  {Labs  Heme  Chem  Endocrine  Serology  Results Review (optional):23779}   Objective    BP 117/80 (BP Location: Right Arm, Patient Position: Sitting, Cuff Size: Large)   Pulse 90   Resp 16   LMP  (LMP Unknown)   SpO2 96%  {Show previous vital signs (optional):23777}  Physical Exam  ***  No results found for any visits on 03/17/22.  Assessment & Plan     ***  No follow-ups on file.      {provider attestation***:1}   Lisa Durie, MD  [facility] [phone] (phone) [phone] (fax)  [place]

## 2022-03-24 ENCOUNTER — Other Ambulatory Visit: Payer: Self-pay | Admitting: Family Medicine

## 2022-03-24 DIAGNOSIS — F411 Generalized anxiety disorder: Secondary | ICD-10-CM

## 2022-03-24 DIAGNOSIS — F324 Major depressive disorder, single episode, in partial remission: Secondary | ICD-10-CM

## 2022-03-24 NOTE — Telephone Encounter (Signed)
Requested Prescriptions  Pending Prescriptions Disp Refills  . busPIRone (BUSPAR) 10 MG tablet [Pharmacy Med Name: busPIRone HCl 10 MG Oral Tablet] 180 tablet 0    Sig: Take 1 tablet by mouth twice daily     Psychiatry: Anxiolytics/Hypnotics - Non-controlled Passed - 03/24/2022 10:32 AM      Passed - Valid encounter within last 12 months    Recent Outpatient Visits          1 week ago    [facility] Rosanna Randy, Retia Passe., MD   2 weeks ago Neck pain   [facility] Jerrol Banana., MD   5 months ago Annual physical exam   [facility] Myles Gip, DO   11 months ago Gastroesophageal reflux disease, unspecified whether esophagitis present   [facility] Jerrol Banana., MD   1 year ago Viral upper respiratory tract infection   [facility] Jerrol Banana., MD      Future Appointments            In 3 weeks Jerrol Banana., MD [facility], [place]   In 6 months Jerrol Banana., MD [facility], [place]

## 2022-04-15 NOTE — Progress Notes (Unsigned)
Established patient visit   Patient: Lisa Stein   DOB: 06-05-1961   60 y.o. Female  MRN: [id number] Visit Date: 04/18/2022  Today's healthcare provider: [name], MD   No chief complaint on file.  Subjective    HPI  Patient comes in today for follow-up.  She is feeling fairly well.  Her urinary symptoms have resolved. She might be ready to work on some lifestyle changes. Anxiety, Follow-up  She was last seen for anxiety 6 months ago. Changes made at last visit include none.   Symptoms: No chest pain No difficulty concentrating  No dizziness No fatigue  No feelings of losing control No insomnia  No irritable No palpitations  No panic attacks No racing thoughts  No shortness of breath No sweating  No tremors/shakes    GAD-7 Results     No data to display          PHQ-9 Scores    04/18/2022    1:37 PM 03/10/2022    4:36 PM 10/18/2021    1:40 PM  PHQ9 SCORE ONLY  PHQ-9 Total Score 1 0 2    ---------------------------------------------------------------------------------------------------   Medications: Outpatient Medications Prior to Visit  Medication Sig   acetaminophen (TYLENOL) 500 MG tablet Take 1,000 mg by mouth every 6 (six) hours as needed for moderate pain or headache.   ALPRAZolam (XANAX) 0.5 MG tablet Take 1 tablet (0.5 mg total) by mouth 2 (two) times daily as needed for anxiety.   busPIRone (BUSPAR) 10 MG tablet Take 1 tablet by mouth twice daily   celecoxib (CELEBREX) 200 MG capsule Take 1 capsule (200 mg total) by mouth daily.   Cholecalciferol (VITAMIN D3) 125 MCG (5000 UT) CAPS Take by mouth daily.   diphenhydrAMINE (BENADRYL) 25 MG tablet Take 50 mg by mouth at bedtime.   fluticasone (FLONASE) 50 MCG/ACT nasal spray Place 1 spray into the nose daily as needed for allergies.    gabapentin (NEURONTIN) 300 MG capsule Take 1 capsule (300 mg total) by mouth 3 (three) times daily.   omeprazole (PRILOSEC) 20 MG capsule TAKE 1  CAPSULE BY MOUTH TWICE DAILY WITH FOOD   phenylephrine (SUDAFED PE) 10 MG TABS tablet Take 10 mg by mouth every 4 (four) hours as needed (congestion / sinuses).   sertraline (ZOLOFT) 100 MG tablet Take 2 tablets (200 mg total) by mouth daily.   Facility-Administered Medications Prior to Visit  Medication Dose Route Frequency Provider   methylPREDNISolone acetate (DEPO-MEDROL) injection 80 mg  80 mg Intramuscular Once Jerrol Banana., MD    Review of Systems  Constitutional:  Negative for appetite change, chills, fatigue and fever.  Respiratory:  Negative for chest tightness and shortness of breath.   Cardiovascular:  Negative for chest pain and palpitations.  Gastrointestinal:  Negative for abdominal pain, nausea and vomiting.  Neurological:  Negative for dizziness and weakness.    Last hemoglobin A1c Lab Results  Component Value Date   HGBA1C 5.8 (H) 10/18/2021       Objective    BP 126/64 (BP Location: Right Arm, Patient Position: Sitting, Cuff Size: Large)   Pulse 98   Temp 98.2 F (36.8 C) (Oral)   Wt 282 lb (127.9 kg)   LMP  (LMP Unknown)   SpO2 93%   BMI 37.21 kg/m  BP Readings from Last 3 Encounters:  04/18/22 126/64  03/17/22 117/80  03/10/22 130/69   Wt Readings from Last 3 Encounters:  04/18/22 282 lb (  127.9 kg)  03/10/22 286 lb (129.7 kg)  10/18/21 277 lb (125.6 kg)      Physical Exam Vitals reviewed.  Constitutional:      General: She is not in acute distress.    Appearance: She is well-developed.  HENT:     Head: Normocephalic and atraumatic.     Right Ear: Hearing normal.     Left Ear: Hearing normal.     Nose: Nose normal.  Eyes:     General: Lids are normal. No scleral icterus.       Right eye: No discharge.        Left eye: No discharge.     Conjunctiva/sclera: Conjunctivae normal.  Cardiovascular:     Rate and Rhythm: Normal rate and regular rhythm.     Heart sounds: Normal heart sounds.  Pulmonary:     Effort: Pulmonary effort  is normal. No respiratory distress.  Skin:    Findings: No lesion or rash.  Neurological:     General: No focal deficit present.     Mental Status: She is alert and oriented to person, place, and time.  Psychiatric:        Mood and Affect: Mood normal.        Speech: Speech normal.        Behavior: Behavior normal.        Thought Content: Thought content normal.        Judgment: Judgment normal.       No results found for any visits on 04/18/22.  Assessment & Plan     1. Dysuria If dip urine is normal no further evaluation.  If abnormal to get culture - POCT urinalysis dipstick  2. Gastroesophageal reflux disease, unspecified whether esophagitis present On omeprazole twice a day for symptom control.  On next visit we will try to go to daily  3. Primary osteoarthritis of left knee   4. Acquired lymphedema   5. Class 2 severe obesity due to excess calories with serious comorbidity and body mass index (BMI) of 37.0 to 37.9 in adult [facility]) Lifestyle changes are discussed and patient who recently retired from [employer]  6. H/O major abdominal surgery    No follow-ups on file.      I, [name], MD, have reviewed all documentation for this visit. The documentation on 04/19/22 for the exam, diagnosis, procedures, and orders are all accurate and complete.    Shana Younge Cranford Mon, MD  [facility] [phone] (phone) [phone] (fax)  [place]

## 2022-04-18 ENCOUNTER — Ambulatory Visit (INDEPENDENT_AMBULATORY_CARE_PROVIDER_SITE_OTHER): Payer: 59 | Admitting: Family Medicine

## 2022-04-18 VITALS — BP 126/64 | HR 98 | Temp 98.2°F | Wt 282.0 lb

## 2022-04-18 DIAGNOSIS — M1712 Unilateral primary osteoarthritis, left knee: Secondary | ICD-10-CM

## 2022-04-18 DIAGNOSIS — Z9889 Other specified postprocedural states: Secondary | ICD-10-CM

## 2022-04-18 DIAGNOSIS — K219 Gastro-esophageal reflux disease without esophagitis: Secondary | ICD-10-CM

## 2022-04-18 DIAGNOSIS — Z6837 Body mass index (BMI) 37.0-37.9, adult: Secondary | ICD-10-CM

## 2022-04-18 DIAGNOSIS — R300 Dysuria: Secondary | ICD-10-CM

## 2022-04-18 DIAGNOSIS — E66812 Obesity, class 2: Secondary | ICD-10-CM

## 2022-04-18 DIAGNOSIS — I890 Lymphedema, not elsewhere classified: Secondary | ICD-10-CM | POA: Diagnosis not present

## 2022-04-18 LAB — POCT URINALYSIS DIPSTICK
Bilirubin, UA: NEGATIVE
Blood, UA: NEGATIVE
Glucose, UA: NEGATIVE
Ketones, UA: NEGATIVE
Leukocytes, UA: NEGATIVE
Nitrite, UA: NEGATIVE
Protein, UA: NEGATIVE
Spec Grav, UA: 1.02 (ref 1.010–1.025)
Urobilinogen, UA: 0.2 E.U./dL
pH, UA: 5 (ref 5.0–8.0)

## 2022-06-08 ENCOUNTER — Encounter: Payer: Self-pay | Admitting: Family Medicine

## 2022-06-08 ENCOUNTER — Other Ambulatory Visit: Payer: Self-pay

## 2022-06-08 DIAGNOSIS — F324 Major depressive disorder, single episode, in partial remission: Secondary | ICD-10-CM

## 2022-06-08 DIAGNOSIS — M5116 Intervertebral disc disorders with radiculopathy, lumbar region: Secondary | ICD-10-CM

## 2022-06-08 DIAGNOSIS — M1710 Unilateral primary osteoarthritis, unspecified knee: Secondary | ICD-10-CM

## 2022-06-08 DIAGNOSIS — F411 Generalized anxiety disorder: Secondary | ICD-10-CM

## 2022-06-08 MED ORDER — OMEPRAZOLE 20 MG PO CPDR: 20.0000 mg | DELAYED_RELEASE_CAPSULE | Freq: Two times a day (BID) | ORAL | 3 refills | Status: DC

## 2022-06-08 MED ORDER — BUSPIRONE HCL 10 MG PO TABS: 10.0000 mg | ORAL_TABLET | Freq: Two times a day (BID) | ORAL | 3 refills | Status: AC

## 2022-06-08 MED ORDER — GABAPENTIN 300 MG PO CAPS: 300.0000 mg | ORAL_CAPSULE | Freq: Three times a day (TID) | ORAL | 2 refills | Status: AC

## 2022-06-08 MED ORDER — SERTRALINE HCL 100 MG PO TABS: 200.0000 mg | ORAL_TABLET | Freq: Every day | ORAL | 3 refills | Status: DC

## 2022-06-08 MED ORDER — CELECOXIB 200 MG PO CAPS: 200.0000 mg | ORAL_CAPSULE | Freq: Every day | ORAL | 3 refills | Status: DC

## 2022-06-12 ENCOUNTER — Other Ambulatory Visit: Payer: Self-pay | Admitting: Family Medicine

## 2022-06-12 DIAGNOSIS — M1710 Unilateral primary osteoarthritis, unspecified knee: Secondary | ICD-10-CM

## 2022-07-23 DIAGNOSIS — M1711 Unilateral primary osteoarthritis, right knee: Secondary | ICD-10-CM | POA: Diagnosis not present

## 2022-08-01 ENCOUNTER — Encounter (INDEPENDENT_AMBULATORY_CARE_PROVIDER_SITE_OTHER): Payer: Self-pay

## 2022-08-01 DIAGNOSIS — M1711 Unilateral primary osteoarthritis, right knee: Secondary | ICD-10-CM | POA: Diagnosis not present

## 2022-10-10 ENCOUNTER — Other Ambulatory Visit: Payer: Self-pay | Admitting: Family Medicine

## 2022-10-10 ENCOUNTER — Telehealth: Payer: Self-pay | Admitting: Family Medicine

## 2022-10-10 DIAGNOSIS — F411 Generalized anxiety disorder: Secondary | ICD-10-CM

## 2022-10-10 DIAGNOSIS — F324 Major depressive disorder, single episode, in partial remission: Secondary | ICD-10-CM

## 2022-10-10 MED ORDER — SERTRALINE HCL 100 MG PO TABS: 200.0000 mg | ORAL_TABLET | Freq: Every day | ORAL | 0 refills | Status: AC

## 2022-10-10 NOTE — Telephone Encounter (Signed)
Medication Refill - Medication: omeprazole (PRILOSEC) 20 MG capsule   Has the patient contacted their pharmacy? Yes.   (Agent: If no, request that the patient contact the pharmacy for the refill. If patient does not wish to contact the pharmacy document the reason why and proceed with request.) (Agent: If yes, when and what did the pharmacy advise?) [facility] but pt changed insurance and has to have a new script per [facility].   Preferred Pharmacy (with phone number or street name):  [place], [place] Phone: [phone]  Fax: [phone]     Has the patient been seen for an appointment in the last year OR does the patient have an upcoming appointment? Yes.    Agent: Please be advised that RX refills may take up to 3 business days. We ask that you follow-up with your pharmacy.

## 2022-10-11 MED ORDER — OMEPRAZOLE 20 MG PO CPDR: 20.0000 mg | DELAYED_RELEASE_CAPSULE | Freq: Two times a day (BID) | ORAL | 0 refills | Status: AC

## 2022-10-19 ENCOUNTER — Encounter: Payer: 59 | Admitting: Family Medicine

## 2023-06-24 ENCOUNTER — Emergency Department: Payer: No Typology Code available for payment source

## 2023-06-24 ENCOUNTER — Emergency Department
Admission: EM | Admit: 2023-06-24 | Discharge: 2023-06-24 | Disposition: A | Discharge status: Home / Self Care | Payer: No Typology Code available for payment source | Attending: Emergency Medicine | Admitting: Emergency Medicine

## 2023-06-24 ENCOUNTER — Other Ambulatory Visit: Payer: Self-pay

## 2023-06-24 DIAGNOSIS — W1843XA Slipping, tripping and stumbling without falling due to stepping from one level to another, initial encounter: Secondary | ICD-10-CM | POA: Insufficient documentation

## 2023-06-24 DIAGNOSIS — S838X2A Sprain of other specified parts of left knee, initial encounter: Secondary | ICD-10-CM | POA: Diagnosis not present

## 2023-06-24 DIAGNOSIS — M25562 Pain in left knee: Secondary | ICD-10-CM | POA: Diagnosis present

## 2023-06-24 MED ORDER — ACETAMINOPHEN 500 MG PO TABS
1000.0000 mg | ORAL_TABLET | Freq: Once | ORAL | Status: AC
  Administered 2023-06-24: 1000 mg via ORAL
  Filled 2023-06-24: qty 2

## 2023-06-24 MED ORDER — OXYCODONE HCL 5 MG PO TABS
5.0000 mg | ORAL_TABLET | Freq: Three times a day (TID) | ORAL | 0 refills | Status: AC | PRN
Start: 2023-06-24 — End: 2024-06-23

## 2023-06-24 MED ORDER — OXYCODONE HCL 5 MG PO TABS
5.0000 mg | ORAL_TABLET | Freq: Once | ORAL | Status: AC
  Administered 2023-06-24: 5 mg via ORAL
  Filled 2023-06-24: qty 1

## 2023-06-24 NOTE — ED Triage Notes (Addendum)
BIB [facility] from home. Pt reports slid on dust pan in house and hyperextended L knee. Hx of surgeries to knee. Pt alert and oriented on arrival. Breathing unlabored. Denies hitting head or LOC during fall.   EMS VS:  16 RR HR 78  140/palp

## 2023-06-24 NOTE — ED Provider Notes (Signed)
[facility] Provider Note    Event Date/Time   First MD Initiated Contact with Patient 06/24/23 0217     (approximate)   History   No chief complaint on file.   HPI  Lisa Stein is a 62 y.o. female who presents to the ED for evaluation of No chief complaint on file.   I reviewed orthopedic clinic visit from June.  Remote history of total left knee arthroplasty 2009, 2021 revision.  She presents from home alongside her husband for evaluation of left knee pain.  She was ambulating, turned a corner and did not realize there is a dustpan on the floor.  Her right foot went into the dustpan causing her to stumble with a hyperextended left knee.  No direct trauma, no head trauma or syncope.  She reports difficulty bending and bearing weight on the knee since this occurred  She is unable to use crutches due to her reverse arthroplasty on the left shoulder, but does have a walker and a wheelchair at home from previous surgeries that is available to her   Physical Exam   Triage Vital Signs: ED Triage Vitals  Encounter Vitals Group     BP 06/24/23 0056 (!) 157/77     Systolic BP Percentile --      Diastolic BP Percentile --      Pulse Rate 06/24/23 0056 75     Resp 06/24/23 0056 18     Temp 06/24/23 0056 98.2 F (36.8 C)     Temp Source 06/24/23 0056 Oral     SpO2 06/24/23 0056 99 %     Weight 06/24/23 0050 276 lb (125.2 kg)     Height 06/24/23 0050 6\' 1"  (1.854 m)     Head Circumference --      Peak Flow --      Pain Score 06/24/23 0050 7     Pain Loc --      Pain Education --      Exclude from Growth Chart --     Most recent vital signs: Vitals:   06/24/23 0056  BP: (!) 157/77  Pulse: 75  Resp: 18  Temp: 98.2 F (36.8 C)  SpO2: 99%    General: Awake, no distress.  CV:  Good peripheral perfusion.  Resp:  Normal effort.  Abd:  No distention.  MSK:  No deformity noted.  Well-healed remote TKA incision over the left knee without  dehiscence or skin changes.  Obese without any discrete swelling appreciated to the knee.  No significant tenderness on palpation of the joint line, patella, but significant pain when trying to passively range Neuro:  No focal deficits appreciated. Other:     ED Results / Procedures / Treatments   Labs (all labs ordered are listed, but only abnormal results are displayed) Labs Reviewed - No data to display  EKG   RADIOLOGY Plain film of the left knee interpreted by me without evidence of fracture, dislocation or hardware malfunction.  Official radiology report(s): DG Knee Complete 4 Views Left  Result Date: 06/24/2023 CLINICAL DATA:  Fall injury with left knee trauma. History of left knee replacement. EXAM: LEFT KNEE - COMPLETE 4+ VIEW COMPARISON:  Immediate postoperative films 09/10/2020 FINDINGS: There is moderate suprapatellar bursal fluid. Soft tissues are otherwise unremarkable. Osteopenia. Total knee arthroplasty hardware is again noted without evidence of loosening, failure or periprosthetic fractures. Calcific debris surrounds the prosthetic joint margins. IMPRESSION: 1. Moderate suprapatellar bursal fluid. 2. Total knee arthroplasty  hardware without evidence of loosening, failure or periprosthetic fractures. 3. Osteopenia. Electronically Signed   By: Almira Bar M.D.   On: 06/24/2023 02:15    PROCEDURES and INTERVENTIONS:  Procedures  Medications  acetaminophen (TYLENOL) tablet 1,000 mg (1,000 mg Oral Given 06/24/23 0254)  oxyCODONE (Oxy IR/ROXICODONE) immediate release tablet 5 mg (5 mg Oral Given 06/24/23 0254)     IMPRESSION / MDM / ASSESSMENT AND PLAN / ED COURSE  I reviewed the triage vital signs and the nursing notes.  Differential diagnosis includes, but is not limited to, fracture, dislocation, hardware loosening, soft tissue injury  Patient with remote left TKA presents with left knee pain and hyperextension injury that I suspect is soft tissue in nature,  suitable for outpatient management with bracing orthopedic follow-up.  Reassuring x-ray.  No focal tenderness, but does have significant pain with passive ranging.  We will place in a knee immobilizer.  She cannot use crutches but has a walker and wheelchair at home that she is comfortable using.  We discussed close orthopedic follow-up.        FINAL CLINICAL IMPRESSION(S) / ED DIAGNOSES   Final diagnoses:  Acute pain of left knee  Sprain of other ligament of left knee, initial encounter     Rx / DC Orders   ED Discharge Orders          Ordered    oxyCODONE (ROXICODONE) 5 MG immediate release tablet  Every 8 hours PRN        06/24/23 0252             Note:  This document was prepared using Dragon voice recognition software and may include unintentional dictation errors.   Delton Prairie, MD 06/24/23 [phone]

## 2023-11-10 DIAGNOSIS — E119 Type 2 diabetes mellitus without complications: Secondary | ICD-10-CM | POA: Diagnosis not present

## 2023-11-10 DIAGNOSIS — Z6837 Body mass index (BMI) 37.0-37.9, adult: Secondary | ICD-10-CM | POA: Diagnosis not present

## 2023-11-10 DIAGNOSIS — F32A Depression, unspecified: Secondary | ICD-10-CM | POA: Diagnosis not present

## 2023-11-10 DIAGNOSIS — F419 Anxiety disorder, unspecified: Secondary | ICD-10-CM | POA: Diagnosis not present

## 2023-11-10 DIAGNOSIS — K219 Gastro-esophageal reflux disease without esophagitis: Secondary | ICD-10-CM | POA: Diagnosis not present

## 2023-11-10 DIAGNOSIS — E66812 Obesity, class 2: Secondary | ICD-10-CM | POA: Diagnosis not present

## 2023-11-10 DIAGNOSIS — J309 Allergic rhinitis, unspecified: Secondary | ICD-10-CM | POA: Diagnosis not present

## 2023-11-24 DIAGNOSIS — G608 Other hereditary and idiopathic neuropathies: Secondary | ICD-10-CM | POA: Diagnosis not present

## 2023-11-24 DIAGNOSIS — E538 Deficiency of other specified B group vitamins: Secondary | ICD-10-CM | POA: Diagnosis not present

## 2024-02-01 DIAGNOSIS — M7541 Impingement syndrome of right shoulder: Secondary | ICD-10-CM | POA: Diagnosis not present

## 2024-02-01 DIAGNOSIS — M1711 Unilateral primary osteoarthritis, right knee: Secondary | ICD-10-CM | POA: Diagnosis not present

## 2024-02-01 DIAGNOSIS — M19011 Primary osteoarthritis, right shoulder: Secondary | ICD-10-CM | POA: Diagnosis not present

## 2024-02-07 DIAGNOSIS — E119 Type 2 diabetes mellitus without complications: Secondary | ICD-10-CM | POA: Diagnosis not present

## 2024-02-07 DIAGNOSIS — Z1211 Encounter for screening for malignant neoplasm of colon: Secondary | ICD-10-CM | POA: Diagnosis not present

## 2024-02-07 DIAGNOSIS — Z6837 Body mass index (BMI) 37.0-37.9, adult: Secondary | ICD-10-CM | POA: Diagnosis not present

## 2024-02-07 DIAGNOSIS — F32A Depression, unspecified: Secondary | ICD-10-CM | POA: Diagnosis not present

## 2024-02-07 DIAGNOSIS — E66812 Obesity, class 2: Secondary | ICD-10-CM | POA: Diagnosis not present

## 2024-02-07 DIAGNOSIS — K219 Gastro-esophageal reflux disease without esophagitis: Secondary | ICD-10-CM | POA: Diagnosis not present

## 2024-02-07 DIAGNOSIS — Z0000 Encounter for general adult medical examination without abnormal findings: Secondary | ICD-10-CM | POA: Diagnosis not present

## 2024-03-08 ENCOUNTER — Other Ambulatory Visit: Payer: Self-pay | Admitting: Family Medicine

## 2024-03-08 DIAGNOSIS — Z1231 Encounter for screening mammogram for malignant neoplasm of breast: Secondary | ICD-10-CM

## 2024-03-19 ENCOUNTER — Ambulatory Visit
Admission: RE | Admit: 2024-03-19 | Discharge: 2024-03-19 | Disposition: A | Discharge status: Home / Self Care | Payer: Self-pay | Source: Ambulatory Visit | Attending: Family Medicine | Admitting: Family Medicine

## 2024-03-19 DIAGNOSIS — Z1231 Encounter for screening mammogram for malignant neoplasm of breast: Secondary | ICD-10-CM | POA: Diagnosis not present

## 2024-05-09 DIAGNOSIS — M7541 Impingement syndrome of right shoulder: Secondary | ICD-10-CM | POA: Diagnosis not present

## 2024-05-09 DIAGNOSIS — M25511 Pain in right shoulder: Secondary | ICD-10-CM | POA: Diagnosis not present

## 2024-05-11 DIAGNOSIS — Z9181 History of falling: Secondary | ICD-10-CM | POA: Diagnosis not present

## 2024-05-11 DIAGNOSIS — M1990 Unspecified osteoarthritis, unspecified site: Secondary | ICD-10-CM | POA: Diagnosis not present

## 2024-05-11 DIAGNOSIS — Z008 Encounter for other general examination: Secondary | ICD-10-CM | POA: Diagnosis not present

## 2024-05-11 DIAGNOSIS — R269 Unspecified abnormalities of gait and mobility: Secondary | ICD-10-CM | POA: Diagnosis not present

## 2024-05-11 DIAGNOSIS — Z6837 Body mass index (BMI) 37.0-37.9, adult: Secondary | ICD-10-CM | POA: Diagnosis not present

## 2024-05-11 DIAGNOSIS — F17211 Nicotine dependence, cigarettes, in remission: Secondary | ICD-10-CM | POA: Diagnosis not present

## 2024-05-29 ENCOUNTER — Ambulatory Visit: Admission: RE | Admit: 2024-05-29 | Source: Home / Self Care | Admitting: Gastroenterology

## 2024-05-29 ENCOUNTER — Encounter: Admission: RE | Payer: Self-pay | Source: Home / Self Care

## 2024-05-29 SURGERY — COLONOSCOPY
Anesthesia: General

## 2024-06-20 ENCOUNTER — Ambulatory Visit: Admitting: Certified Registered Nurse Anesthetist

## 2024-06-20 ENCOUNTER — Ambulatory Visit
Admission: RE | Admit: 2024-06-20 | Discharge: 2024-06-20 | Disposition: A | Discharge status: Home / Self Care | Attending: Gastroenterology | Admitting: Gastroenterology

## 2024-06-20 ENCOUNTER — Other Ambulatory Visit: Payer: Self-pay

## 2024-06-20 ENCOUNTER — Encounter: Payer: Self-pay | Admitting: Gastroenterology

## 2024-06-20 ENCOUNTER — Encounter
Admission: RE | Disposition: A | Discharge status: Home / Self Care | Payer: Self-pay | Source: Home / Self Care | Attending: Gastroenterology

## 2024-06-20 DIAGNOSIS — Z6838 Body mass index (BMI) 38.0-38.9, adult: Secondary | ICD-10-CM | POA: Insufficient documentation

## 2024-06-20 DIAGNOSIS — Z980 Intestinal bypass and anastomosis status: Secondary | ICD-10-CM | POA: Diagnosis not present

## 2024-06-20 DIAGNOSIS — D123 Benign neoplasm of transverse colon: Secondary | ICD-10-CM | POA: Insufficient documentation

## 2024-06-20 DIAGNOSIS — Z1211 Encounter for screening for malignant neoplasm of colon: Secondary | ICD-10-CM | POA: Diagnosis not present

## 2024-06-20 DIAGNOSIS — M1990 Unspecified osteoarthritis, unspecified site: Secondary | ICD-10-CM | POA: Insufficient documentation

## 2024-06-20 DIAGNOSIS — D120 Benign neoplasm of cecum: Secondary | ICD-10-CM | POA: Insufficient documentation

## 2024-06-20 DIAGNOSIS — K219 Gastro-esophageal reflux disease without esophagitis: Secondary | ICD-10-CM | POA: Insufficient documentation

## 2024-06-20 DIAGNOSIS — D125 Benign neoplasm of sigmoid colon: Secondary | ICD-10-CM | POA: Diagnosis not present

## 2024-06-20 DIAGNOSIS — Z87891 Personal history of nicotine dependence: Secondary | ICD-10-CM | POA: Diagnosis not present

## 2024-06-20 DIAGNOSIS — F419 Anxiety disorder, unspecified: Secondary | ICD-10-CM | POA: Diagnosis not present

## 2024-06-20 DIAGNOSIS — E66813 Obesity, class 3: Secondary | ICD-10-CM | POA: Insufficient documentation

## 2024-06-20 DIAGNOSIS — K635 Polyp of colon: Secondary | ICD-10-CM | POA: Diagnosis not present

## 2024-06-20 HISTORY — PX: COLONOSCOPY: SHX5424

## 2024-06-20 HISTORY — PX: POLYPECTOMY: SHX149

## 2024-06-20 SURGERY — COLONOSCOPY
Anesthesia: General

## 2024-06-20 MED ORDER — PROPOFOL 10 MG/ML IV BOLUS
INTRAVENOUS | Status: DC | PRN
  Administered 2024-06-20: 50 mg via INTRAVENOUS
  Administered 2024-06-20: 20 mg via INTRAVENOUS

## 2024-06-20 MED ORDER — PROPOFOL 500 MG/50ML IV EMUL
INTRAVENOUS | Status: DC | PRN
  Administered 2024-06-20: 150 ug/kg/min via INTRAVENOUS

## 2024-06-20 MED ORDER — SODIUM CHLORIDE 0.9 % IV SOLN: INTRAVENOUS | Status: DC

## 2024-06-20 MED ORDER — DEXMEDETOMIDINE HCL IN NACL 80 MCG/20ML IV SOLN
INTRAVENOUS | Status: DC | PRN
Start: 2024-06-20 — End: 2024-06-20
  Administered 2024-06-20: 8 ug via INTRAVENOUS

## 2024-06-20 NOTE — Anesthesia Preprocedure Evaluation (Signed)
 Anesthesia Evaluation  Patient identified by MRN, date of birth, ID band Patient awake    Reviewed: Allergy & Precautions, NPO status , Patient's Chart, lab work & pertinent test results  Airway Mallampati: II  TM Distance: >3 FB Neck ROM: full    Dental  (+) Teeth Intact   Pulmonary neg pulmonary ROS, Patient abstained from smoking., former smoker   Pulmonary exam normal breath sounds clear to auscultation       Cardiovascular Exercise Tolerance: Good negative cardio ROS Normal cardiovascular exam Rhythm:Regular Rate:Normal     Neuro/Psych   Anxiety     negative neurological ROS  negative psych ROS   GI/Hepatic negative GI ROS, Neg liver ROS,GERD  Medicated,,  Endo/Other  negative endocrine ROS  Class 3 obesity  Renal/GU negative Renal ROS  negative genitourinary   Musculoskeletal  (+) Arthritis ,    Abdominal  (+) + obese  Peds negative pediatric ROS (+)  Hematology negative hematology ROS (+)   Anesthesia Other Findings Past Medical History: No date: Anxiety 11/24/2016: Community acquired pneumonia No date: Complication of anesthesia     Comment:  difficulty waking up after back surgery. o2 sats dropped              low. No date: Depression 08/05/2014: Diarrhea 03/16/2015: Difficult or painful urination 03/16/2015: Fatigue No date: GERD (gastroesophageal reflux disease) No date: Heart murmur     Comment:  no treatment No date: History of kidney stones 08/05/2014: Lower abdominal pain No date: Lymphedema     Comment:  left ankle. sometimes uses compression socks 07/21/2016: Menopause No date: Neuralgia No date: Osteoarthritis No date: Pneumonia No date: Raynaud disease 11/17/2016: Sepsis [facility])  Past Surgical History: 2003: ABDOMINAL HYSTERECTOMY 2010: ANKLE FRACTURE SURGERY; Left No date: BREAST BIOPSY     Comment:  2016 No date: Bunionectomy; Right 08/08/2014: COLON SURGERY     Comment:  Right  hemicolectomy for cecal mass on CT, suggestion of               ileocolic intussusception with mucosal necrosis only. 2010: COLONOSCOPY     Comment:  Dr. Viktoria 08/07/2014: COLONOSCOPY     Comment:  Dr Dessa 04/26/2018: EXTRACORPOREAL SHOCK WAVE LITHOTRIPSY; Right     Comment:  Procedure: EXTRACORPOREAL SHOCK WAVE LITHOTRIPSY (ESWL);              Surgeon: Penne Knee, MD;  Location: [facility] ORS;                Service: Urology;  Laterality: Right; 08/08/2014: HEMICOLECTOMY No date: JOINT REPLACEMENT No date: REFRACTIVE SURGERY; Left 2009: REPLACEMENT TOTAL KNEE; Left 2004: sinus problem     Comment:  Sinuses opened up 08/02/2016: SPINE SURGERY     Comment:  spinal fusion - [facility] 2016: TOOTH EXTRACTION; Right 09/10/2020: TOTAL KNEE REVISION; Left     Comment:  Procedure: Left total knee arthroplasty revision of               tibial poly, possible patella [place] Amber to Assist;                Surgeon: Kathlynn Sharper, MD;  Location: [facility] ORS;                Service: Orthopedics;  Laterality: Left;  [place] Amber to              Assist [phone]: TOTAL SHOULDER REPLACEMENT; Left  BMI    Body Mass Index: 38.49 kg/m  Reproductive/Obstetrics negative OB ROS                              Anesthesia Physical Anesthesia Plan  ASA: 2  Anesthesia Plan: General   Post-op Pain Management:    Induction: Intravenous  PONV Risk Score and Plan: Propofol  infusion and TIVA  Airway Management Planned: Natural Airway and Nasal Cannula  Additional Equipment:   Intra-op Plan:   Post-operative Plan:   Informed Consent: I have reviewed the patients History and Physical, chart, labs and discussed the procedure including the risks, benefits and alternatives for the proposed anesthesia with the patient or authorized representative who has indicated his/her understanding and acceptance.     Dental Advisory Given  Plan Discussed with: CRNA  Anesthesia  Plan Comments:         Anesthesia Quick Evaluation

## 2024-06-20 NOTE — Anesthesia Procedure Notes (Signed)
 Date/Time: 06/20/2024 11:01 AM  Performed by: Duwayne Craven, CRNAPre-anesthesia Checklist: Patient identified, Emergency Drugs available, Suction available, Patient being monitored and Timeout performed Patient Re-evaluated:Patient Re-evaluated prior to induction Oxygen Delivery Method: Nasal cannula Induction Type: IV induction Placement Confirmation: CO2 detector and positive ETCO2

## 2024-06-20 NOTE — Anesthesia Postprocedure Evaluation (Signed)
 Anesthesia Post Note  Patient: Lisa Stein  Procedure(s) Performed: COLONOSCOPY POLYPECTOMY, INTESTINE  Patient location during evaluation: PACU Anesthesia Type: General Level of consciousness: awake Pain management: pain level controlled Vital Signs Assessment: post-procedure vital signs reviewed and stable Cardiovascular status: blood pressure returned to baseline Anesthetic complications: no   No notable events documented.   Last Vitals:  Vitals:   06/20/24 1134 06/20/24 1137  BP: 120/64   Pulse: 60 (!) 58  Resp: 15 15  Temp:    SpO2: 98% 98%    Last Pain:  Vitals:   06/20/24 1122  TempSrc: Temporal  PainSc:                  VAN STAVEREN,Shadavia Dampier

## 2024-06-20 NOTE — Transfer of Care (Signed)
 Immediate Anesthesia Transfer of Care Note  Patient: Lisa Stein  Procedure(s) Performed: COLONOSCOPY POLYPECTOMY, INTESTINE  Patient Location: PACU  Anesthesia Type:General  Level of Consciousness: awake and alert   Airway & Oxygen Therapy: Patient Spontanous Breathing  Post-op Assessment: Report given to RN and Post -op Vital signs reviewed and stable  Post vital signs: Reviewed and stable  Last Vitals:  Vitals Value Taken Time  BP 82/40 06/20/24 11:22  Temp 35.3 C 06/20/24 11:22  Pulse 65 06/20/24 11:23  Resp 14 06/20/24 11:23  SpO2 97 % 06/20/24 11:23  Vitals shown include unfiled device data.  Last Pain:  Vitals:   06/20/24 1122  TempSrc: Temporal  PainSc:          Complications: No notable events documented.

## 2024-06-20 NOTE — H&P (Signed)
 Lisa JONELLE Brooklyn, MD [facility], DHIP [address]  [place], [place] [address]  Main: [phone] Fax:  [phone] Pager: [phone]   Primary Care Physician:  Bertrum Charlie CROME, MD Primary Gastroenterologist:  Dr. Corinn JONELLE Stein  Pre-Procedure History & Physical: HPI:  Lisa Stein is a 63 y.o. female is here for an colonoscopy.   Past Medical History:  Diagnosis Date   Anxiety    Community acquired pneumonia 11/24/2016   Complication of anesthesia    difficulty waking up after back surgery. o2 sats dropped low.   Depression    Diarrhea 08/05/2014   Difficult or painful urination 03/16/2015   Fatigue 03/16/2015   GERD (gastroesophageal reflux disease)    Heart murmur    no treatment   History of kidney stones    Lower abdominal pain 08/05/2014   Lymphedema    left ankle. sometimes uses compression socks   Menopause 07/21/2016   Neuralgia    Osteoarthritis    Pneumonia    Raynaud disease    Sepsis (HCC) 11/17/2016    Past Surgical History:  Procedure Laterality Date   ABDOMINAL HYSTERECTOMY  2003   ANKLE FRACTURE SURGERY Left 2010   BREAST BIOPSY     2016   Bunionectomy Right    COLON SURGERY  08/08/2014   Right hemicolectomy for cecal mass on CT, suggestion of ileocolic intussusception with mucosal necrosis only.   COLONOSCOPY  2010   Dr. Viktoria   COLONOSCOPY  08/07/2014   Dr Dessa   EXTRACORPOREAL SHOCK WAVE LITHOTRIPSY Right 04/26/2018   Procedure: EXTRACORPOREAL SHOCK WAVE LITHOTRIPSY (ESWL);  Surgeon: Penne Knee, MD;  Location: [facility] ORS;  Service: Urology;  Laterality: Right;   HEMICOLECTOMY  08/08/2014   JOINT REPLACEMENT     REFRACTIVE SURGERY Left    REPLACEMENT TOTAL KNEE Left 2009   sinus problem  2004   Sinuses opened up   SPINE SURGERY  08/02/2016   spinal fusion - [facility]   TOOTH EXTRACTION Right 2016   TOTAL KNEE REVISION Left 09/10/2020   Procedure: Left total knee arthroplasty revision of tibial  poly, possible patella [place] Amber to Assist;  Surgeon: Kathlynn Sharper, MD;  Location: [facility] ORS;  Service: Orthopedics;  Laterality: Left;  [place] Amber to Assist   TOTAL SHOULDER REPLACEMENT Left 2008,2009    Prior to Admission medications   Medication Sig Start Date End Date Taking? Authorizing Provider  acetaminophen  (TYLENOL ) 500 MG tablet Take 1,000 mg by mouth every 6 (six) hours as needed for moderate pain or headache.    [provider]  ALPRAZolam  (XANAX ) 0.5 MG tablet Take 1 tablet (0.5 mg total) by mouth 2 (two) times daily as needed for anxiety. 05/28/19   Bertrum Charlie CROME, MD  busPIRone  (BUSPAR ) 10 MG tablet Take 1 tablet (10 mg total) by mouth 2 (two) times daily. 06/08/22   Bertrum Charlie CROME, MD  celecoxib  (CELEBREX ) 200 MG capsule TAKE 1 CAPSULE DAILY. 06/13/22   Bertrum Charlie CROME, MD  Cholecalciferol  (VITAMIN D3) 125 MCG (5000 UT) CAPS Take by mouth daily.    [provider]  diphenhydrAMINE  (BENADRYL ) 25 MG tablet Take 50 mg by mouth at bedtime.    [provider]  fluticasone  (FLONASE ) 50 MCG/ACT nasal spray Place 1 spray into the nose daily as needed for allergies.  12/05/13   [provider]  gabapentin  (NEURONTIN ) 300 MG capsule Take 1 capsule (300 mg total) by mouth 3 (three) times daily. 06/08/22  Bertrum Charlie CROME, MD  omeprazole  (PRILOSEC) 20 MG capsule Take 1 capsule (20 mg total) by mouth 2 (two) times daily before a meal. Please schedule office visit before any future refill. 10/11/22   Simmons-Robinson, Rockie, MD  oxyCODONE  (ROXICODONE ) 5 MG immediate release tablet Take 1 tablet (5 mg total) by mouth every 8 (eight) hours as needed. 06/24/23 06/23/24  Claudene Rover, MD  phenylephrine  (SUDAFED PE) 10 MG TABS tablet Take 10 mg by mouth every 4 (four) hours as needed (congestion / sinuses).    [provider]  sertraline  (ZOLOFT ) 100 MG tablet Take 2 tablets (200 mg total) by mouth daily. 10/10/22   Simmons-Robinson, Rockie, MD     Allergies as of 06/04/2024 - Review Complete 05/28/2024  Allergen Reaction Noted   Pregabalin  Other (See Comments) 07/17/2020   Sulfa antibiotics Itching 08/04/2014   Tegretol [carbamazepine] Itching 03/16/2015   Amitriptyline  Swelling 11/07/2018   Benzoin Dermatitis 03/16/2015   Betadine  [povidone iodine] Rash 03/16/2015   Biaxin [clarithromycin] Diarrhea 08/04/2014    Family History  Problem Relation Age of Onset   Hypertension Mother    Arthritis Mother    COPD Mother    Raynaud syndrome Mother    Kidney failure Mother    Heart failure Mother    Cancer Father        lung & liver cancer   Raynaud syndrome Sister    Raynaud syndrome Sister    Breast cancer Neg Hx     Social History   Socioeconomic History   Marital status: Married    Spouse name: way ne   Number of children: Not on file   Years of education: Not on file   Highest education level: Not on file  Occupational History   Occupation: Artist  Tobacco Use   Smoking status: Former    Types: Cigarettes   Smokeless tobacco: Never   Tobacco comments:    Quit smoking in 1980's  Vaping Use   Vaping status: Never Used  Substance and Sexual Activity   Alcohol use: Yes    Alcohol/week: 1.0 standard drink of alcohol    Types: 1 Glasses of wine per week    Comment: occasional   Drug use: No   Sexual activity: Not on file  Other Topics Concern   Not on file  Social History Narrative   Lives at home with husband and family. Independent at baseline.   Many family members reside within the home.   Patient works from home.   Social Drivers of Corporate investment banker Strain: Low Risk  (02/07/2024)   Received from [facility]   Overall Financial Resource Strain (CARDIA)    Difficulty of Paying Living Expenses: Not hard at all  Food Insecurity: No Food Insecurity (02/07/2024)   Received from [facility]   Hunger Vital Sign    Within the past 12 months, you  worried that your food would run out before you got the money to buy more.: Never true    Within the past 12 months, the food you bought just didn't last and you didn't have money to get more.: Never true  Transportation Needs: No Transportation Needs (02/07/2024)   Received from [facility] - Transportation    In the past 12 months, has lack of transportation kept you from medical appointments or from getting medications?: No    Lack of Transportation (Non-Medical): No  Physical Activity: Not on file  Stress: Not on file  Social Connections: Not on file  Intimate Partner Violence: Not on file    Review of Systems: See HPI, otherwise negative ROS  Physical Exam: BP 135/73   Pulse 79   Temp (!) 96.3 F (35.7 C) (Tympanic)   Resp 18   Ht 5' 11 (1.803 m)   Wt 125.2 kg   LMP  (LMP Unknown)   SpO2 97%   BMI 38.49 kg/m  General:   Alert,  pleasant and cooperative in NAD Head:  Normocephalic and atraumatic. Neck:  Supple; no masses or thyromegaly. Lungs:  Clear throughout to auscultation.    Heart:  Regular rate and rhythm. Abdomen:  Soft, nontender and nondistended. Normal bowel sounds, without guarding, and without rebound.   Neurologic:  Alert and  oriented x4;  grossly normal neurologically.  Impression/Plan: ELLENE BLOODSAW is here for an colonoscopy to be performed for colon cancer screening  Risks, benefits, limitations, and alternatives regarding  colonoscopy have been reviewed with the patient.  Questions have been answered.  All parties agreeable.   Lisa Brooklyn, MD  06/20/2024, 9:57 AM

## 2024-06-20 NOTE — Op Note (Signed)
 [facility] Patient Name: Lisa Stein Procedure Date: 06/20/2024 10:43 AM MRN: [id number] Account #: [id number] Date of Birth: Jul 30, 1961 Admit Type: Outpatient Age: 63 Room: [facility] ENDO ROOM 2 Gender: Female Note Status: Finalized Instrument Name: Colon Scope [id number] Procedure:             Colonoscopy Indications:           Screening for colorectal malignant neoplasm, Last                         colonoscopy: November 2015, Last colonoscopy 10 years                         ago Providers:             Corinn Jess Brooklyn MD, MD Referring MD:          Charlie CROME. Bertrum, MD (Referring MD) Medicines:             General Anesthesia Complications:         No immediate complications. Estimated blood loss: None. Procedure:             Pre-Anesthesia Assessment:                        - Prior to the procedure, a History and Physical was                         performed, and patient medications and allergies were                         reviewed. The patient is competent. The risks and                         benefits of the procedure and the sedation options and                         risks were discussed with the patient. All questions                         were answered and informed consent was obtained.                         Patient identification and proposed procedure were                         verified by the physician, the nurse, the                         anesthesiologist, the anesthetist and the technician                         in the pre-procedure area in the procedure room in the                         endoscopy suite. Mental Status Examination: alert and                         oriented. Airway Examination: normal oropharyngeal  airway and neck mobility. Respiratory Examination:                         clear to auscultation. CV Examination: normal.                         Prophylactic Antibiotics: The patient  does not require                         prophylactic antibiotics. Prior Anticoagulants: The                         patient has taken no anticoagulant or antiplatelet                         agents. ASA Grade Assessment: II - A patient with mild                         systemic disease. After reviewing the risks and                         benefits, the patient was deemed in satisfactory                         condition to undergo the procedure. The anesthesia                         plan was to use general anesthesia. Immediately prior                         to administration of medications, the patient was                         re-assessed for adequacy to receive sedatives. The                         heart rate, respiratory rate, oxygen saturations,                         blood pressure, adequacy of pulmonary ventilation, and                         response to care were monitored throughout the                         procedure. The physical status of the patient was                         re-assessed after the procedure.                        After obtaining informed consent, the colonoscope was                         passed under direct vision. Throughout the procedure,                         the patient's blood pressure, pulse, and oxygen  saturations were monitored continuously. The                         Colonoscope was introduced through the anus and                         advanced to the the ileocolonic anastomosis. The                         colonoscopy was performed with moderate difficulty due                         to significant looping and the patient's body habitus.                         Successful completion of the procedure was aided by                         applying abdominal pressure. The patient tolerated the                         procedure well. The quality of the bowel preparation                         was evaluated using  the BBPS [facility] Bowel Preparation                         Scale) with scores of: Right Colon = 3, Transverse                         Colon = 3 and Left Colon = 3 (entire mucosa seen well                         with no residual staining, small fragments of stool or                         opaque liquid). The total BBPS score equals 9. The                         ileocecal valve, appendiceal orifice, and rectum were                         photographed. Findings:      The perianal and digital rectal examinations were normal. Pertinent       negatives include normal sphincter tone and no palpable rectal lesions.      Three sessile polyps were found in the sigmoid colon, transverse colon       and cecum. The polyps were 5 to 6 mm in size. These polyps were removed       with a cold snare. Resection and retrieval were complete. Estimated       blood loss: none.      The retroflexed view of the distal rectum and anal verge was normal and       showed no anal or rectal abnormalities.      There was evidence of a prior end-to-side ileo-colonic anastomosis in       the cecum. This was patent and was  characterized by healthy appearing       mucosa. Impression:            - Three 5 to 6 mm polyps in the sigmoid colon, in the                         transverse colon and in the cecum, removed with a cold                         snare. Resected and retrieved.                        - The distal rectum and anal verge are normal on                         retroflexion view.                        - Patent end-to-side ileo-colonic anastomosis,                         characterized by healthy appearing mucosa. Recommendation:        - Discharge patient to home (with escort).                        - Resume previous diet today.                        - Continue present medications.                        - Await pathology results.                        - Repeat colonoscopy in 3 - 5 years for  surveillance. Procedure Code(s):     --- Professional ---                        [phone], Colonoscopy, flexible; with removal of                         tumor(s), polyp(s), or other lesion(s) by snare                         technique Diagnosis Code(s):     --- Professional ---                        Z12.11, Encounter for screening for malignant neoplasm                         of colon                        D12.5, Benign neoplasm of sigmoid colon                        D12.0, Benign neoplasm of cecum                        D12.3, Benign neoplasm of transverse colon (hepatic  flexure or splenic flexure) CPT copyright 2022 American Medical Association. All rights reserved. The codes documented in this report are preliminary and upon coder review may  be revised to meet current compliance requirements. Dr. Corinn Brooklyn Corinn Jess Brooklyn MD, MD 06/20/2024 11:21:35 AM This report has been signed electronically. Number of Addenda: 0 Note Initiated On: 06/20/2024 10:43 AM Scope Withdrawal Time: 0 hours 10 minutes 26 seconds  Total Procedure Duration: 0 hours 15 minutes 27 seconds  Estimated Blood Loss:  Estimated blood loss: none. Estimated blood loss: none.      [facility]

## 2024-06-21 LAB — SURGICAL PATHOLOGY

## 2024-06-24 ENCOUNTER — Ambulatory Visit: Payer: Self-pay | Admitting: Gastroenterology
